# Patient Record
Sex: Female | Born: 1980 | Race: White | Hispanic: No | Marital: Single | Smoking: Current every day smoker
Health system: Southern US, Community
[De-identification: ages and names within clinical notes are randomized; demographics above are authoritative.]

## PROBLEM LIST (undated history)

## (undated) DIAGNOSIS — G43909 Migraine, unspecified, not intractable, without status migrainosus: Secondary | ICD-10-CM

## (undated) DIAGNOSIS — E785 Hyperlipidemia, unspecified: Secondary | ICD-10-CM

## (undated) DIAGNOSIS — M1990 Unspecified osteoarthritis, unspecified site: Secondary | ICD-10-CM

## (undated) DIAGNOSIS — Z803 Family history of malignant neoplasm of breast: Secondary | ICD-10-CM

## (undated) DIAGNOSIS — G4730 Sleep apnea, unspecified: Secondary | ICD-10-CM

## (undated) DIAGNOSIS — Z87442 Personal history of urinary calculi: Secondary | ICD-10-CM

## (undated) DIAGNOSIS — D649 Anemia, unspecified: Secondary | ICD-10-CM

## (undated) DIAGNOSIS — F32A Depression, unspecified: Secondary | ICD-10-CM

## (undated) DIAGNOSIS — I10 Essential (primary) hypertension: Secondary | ICD-10-CM

## (undated) DIAGNOSIS — M419 Scoliosis, unspecified: Secondary | ICD-10-CM

## (undated) DIAGNOSIS — F419 Anxiety disorder, unspecified: Secondary | ICD-10-CM

## (undated) DIAGNOSIS — I499 Cardiac arrhythmia, unspecified: Secondary | ICD-10-CM

## (undated) DIAGNOSIS — J189 Pneumonia, unspecified organism: Secondary | ICD-10-CM

## (undated) HISTORY — PX: CHOLECYSTECTOMY: SHX55

## (undated) HISTORY — DX: Migraine, unspecified, not intractable, without status migrainosus: G43.909

## (undated) HISTORY — DX: Scoliosis, unspecified: M41.9

## (undated) HISTORY — DX: Family history of malignant neoplasm of breast: Z80.3

## (undated) HISTORY — PX: INDUCED ABORTION: SHX677

---

## 2004-08-04 ENCOUNTER — Observation Stay: Payer: Self-pay

## 2004-10-28 ENCOUNTER — Observation Stay: Payer: Self-pay | Admitting: Unknown Physician Specialty

## 2004-11-16 ENCOUNTER — Observation Stay: Payer: Self-pay | Admitting: Unknown Physician Specialty

## 2004-11-24 ENCOUNTER — Observation Stay: Payer: Self-pay

## 2004-11-24 ENCOUNTER — Inpatient Hospital Stay: Payer: Self-pay | Admitting: Unknown Physician Specialty

## 2004-12-16 ENCOUNTER — Emergency Department: Payer: Self-pay | Admitting: Emergency Medicine

## 2005-04-21 ENCOUNTER — Emergency Department: Payer: Self-pay | Admitting: Emergency Medicine

## 2005-04-22 IMAGING — CT CT ABD-PELV W/ CM
1 of 2 series · 16 of 32 positions shown, 20 images · non-contrast
Comparison: none

REASON FOR EXAM: Appendix
COMMENTS:

PROCEDURE:     CT  - CT ABDOMEN / PELVIS  W  - [DATE]  [DATE]
RESULT:     The liver and spleen are normal.  The adrenals are normal.  No
focal renal abnormality is identified.  There is no bowel distention.  The
appendix is normal.  The lung bases are clear.

[Series 2: soft tissue · axial · 0.68mm/px · z∈[-1036,-676]mm · 16 of 135 slices shown, 20 images]
[im 10/135  soft-tissue]
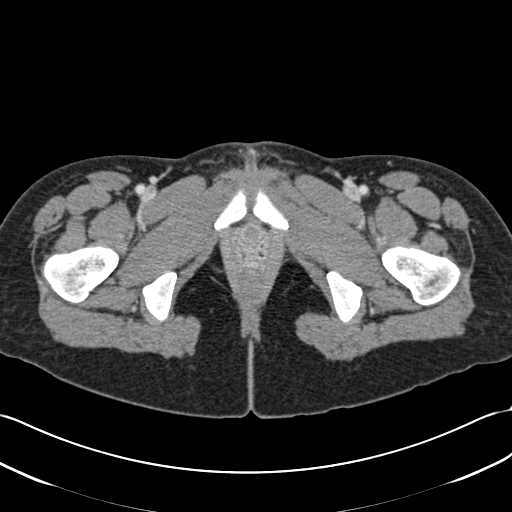
[im 10/135  bone]
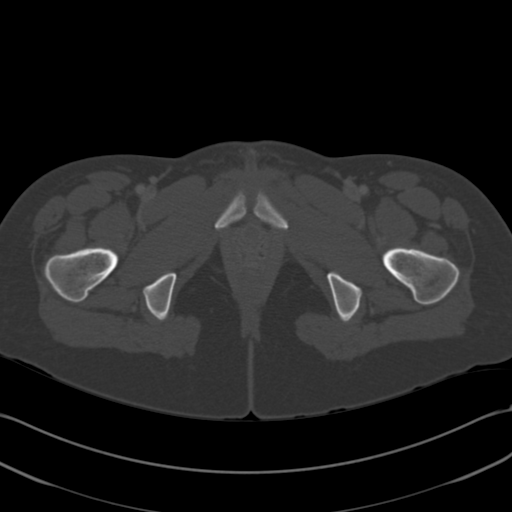
[im 19/135  soft-tissue]
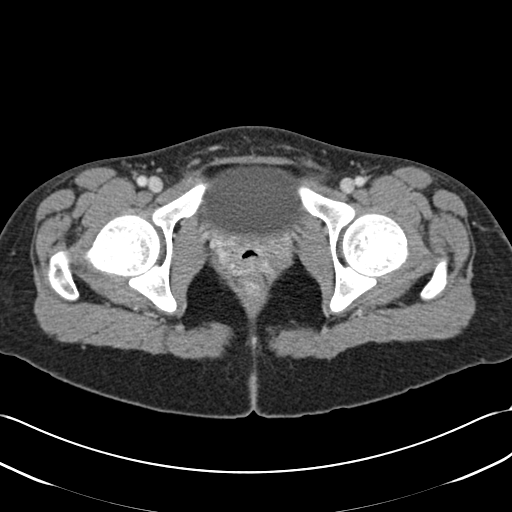
[im 28/135  soft-tissue]
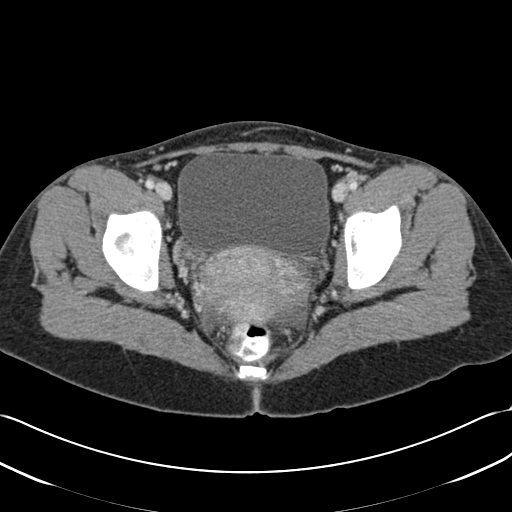
[im 37/135  soft-tissue]
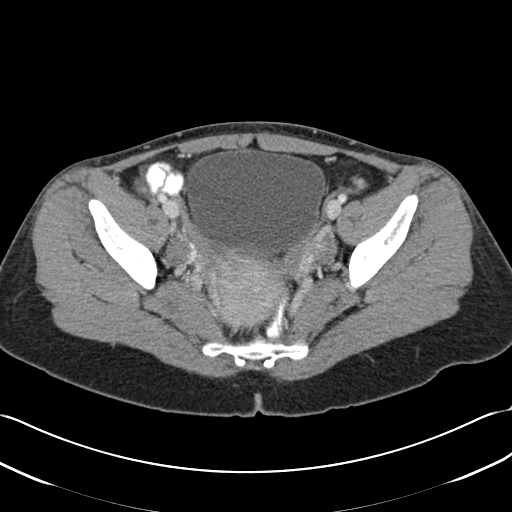
[im 47/135  soft-tissue]
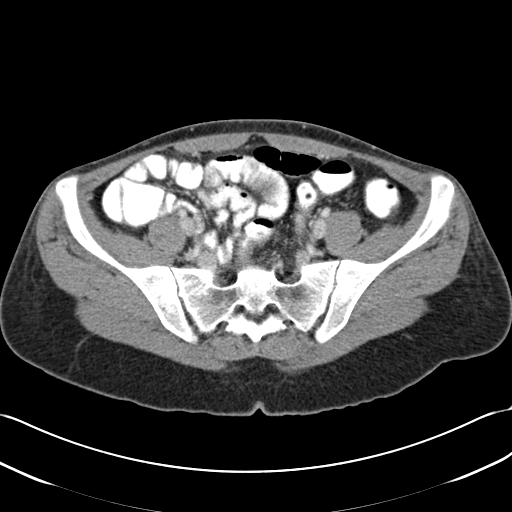
[im 56/135  soft-tissue]
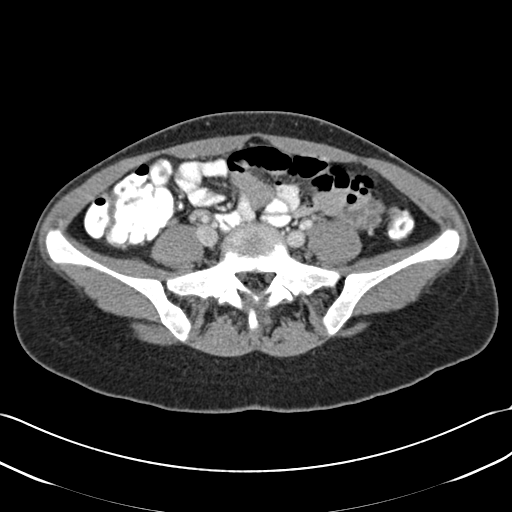
[im 65/135  soft-tissue]
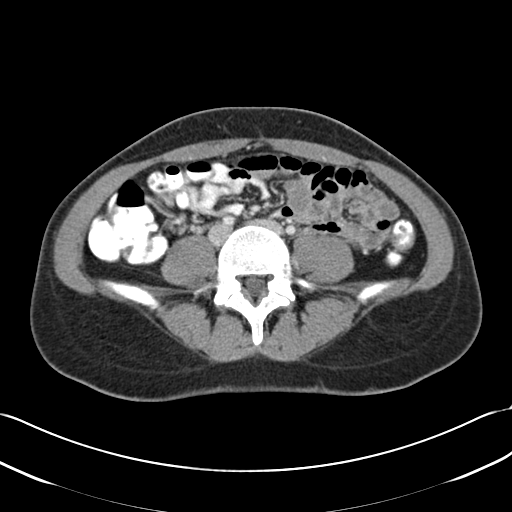
[im 74/135  soft-tissue]
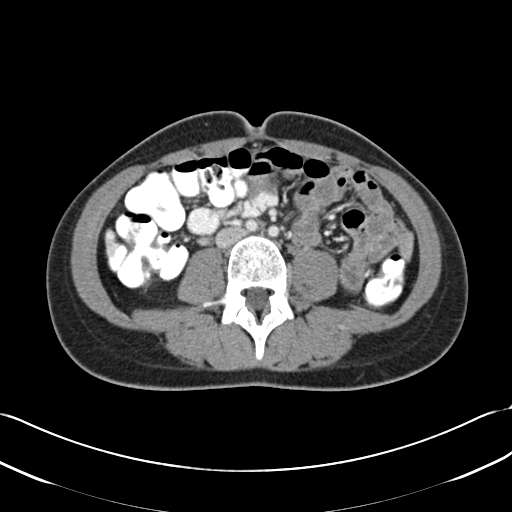
[im 84/135  soft-tissue]
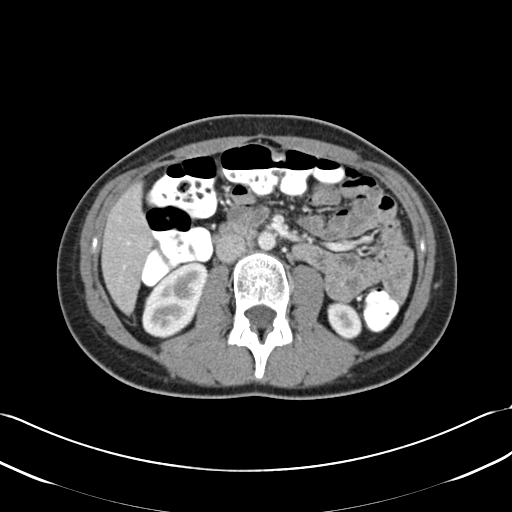
[im 84/135  bone]
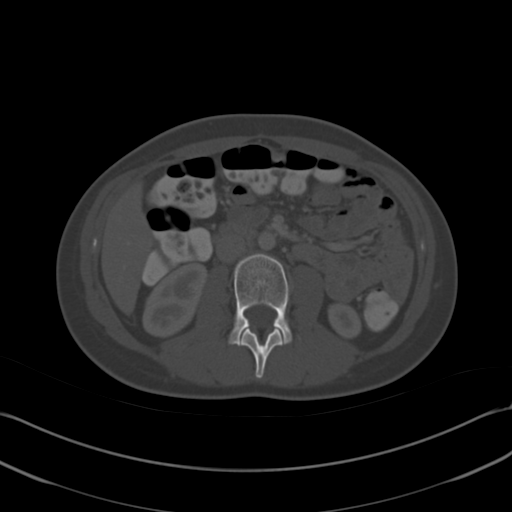
[im 93/135  soft-tissue]
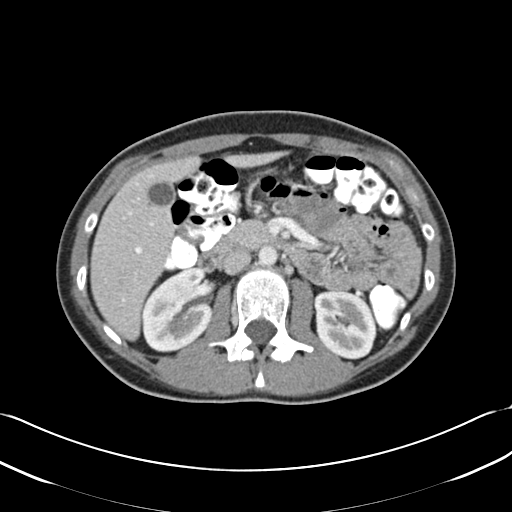
[im 102/135  soft-tissue]
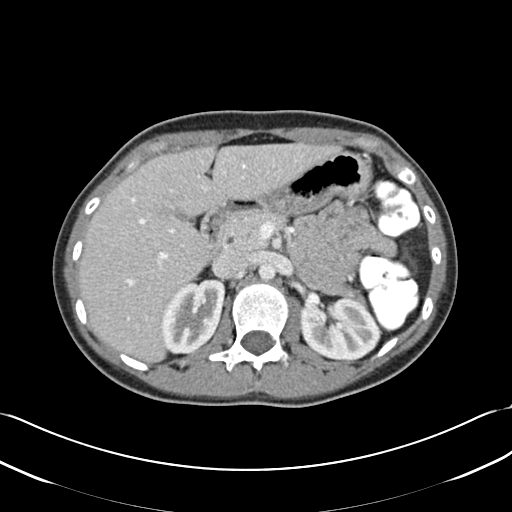
[im 111/135  soft-tissue]
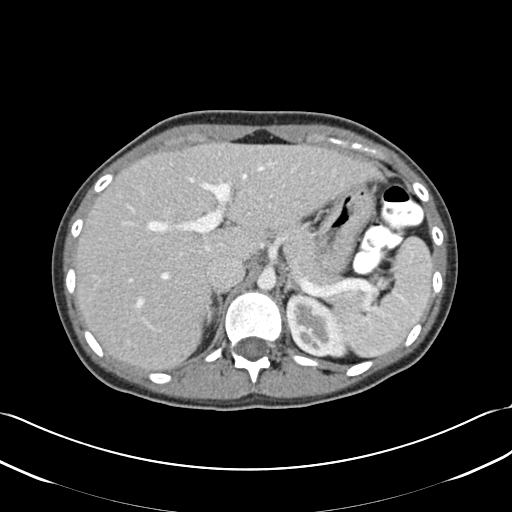
[im 116/135  lung]
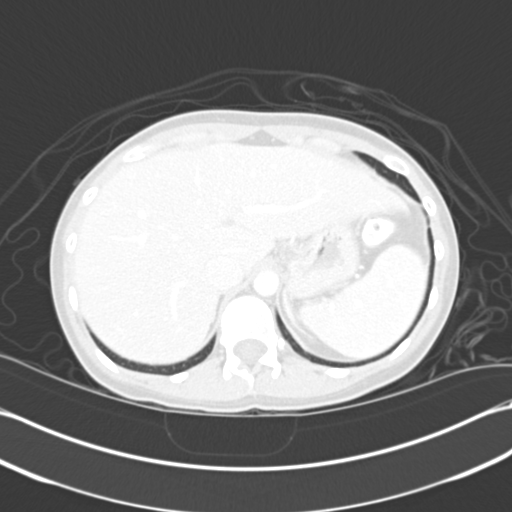
[im 121/135  soft-tissue]
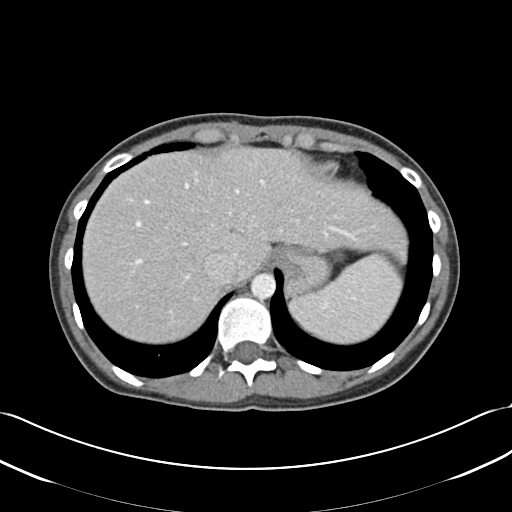
[im 121/135  lung]
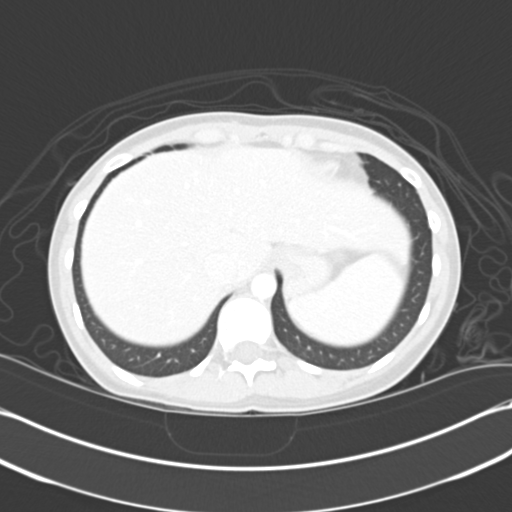
[im 125/135  lung]
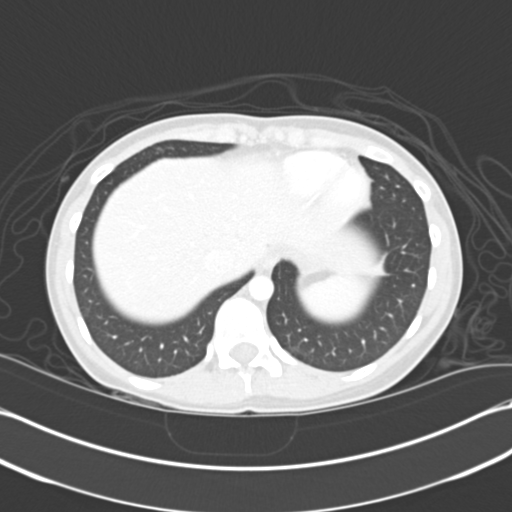
[im 130/135  soft-tissue]
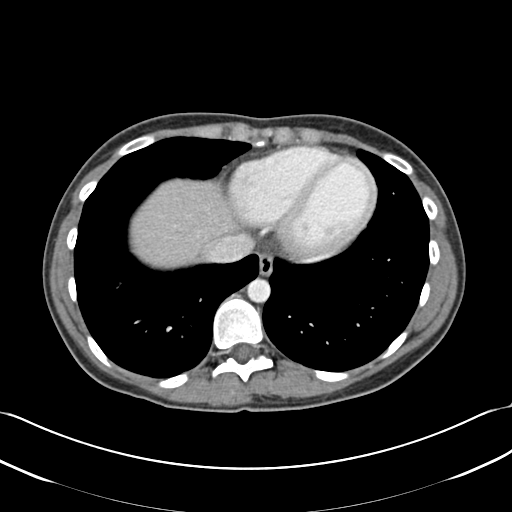
[im 130/135  lung]
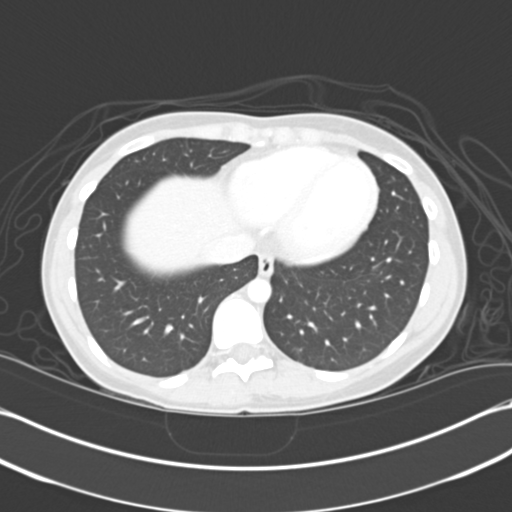

[16 of 32 positions shown; findings below may reference images not displayed]

IMPRESSION: Nonspecific abdomen and pelvis. The initial report was
faxed to the emergency room at the time of the study.  Specifically, there
is no evidence of appendicitis.

## 2005-04-23 ENCOUNTER — Ambulatory Visit: Payer: Self-pay | Admitting: Emergency Medicine

## 2005-04-23 IMAGING — US US PELV - US TRANSVAGINAL
1 series · 14 of 14 positions shown · non-contrast
Comparison: none

REASON FOR EXAM: RLQ pain
COMMENTS:

[Series 1: us pelv - us transvaginal · 0.31mm/px · 14 of 14 slices shown]
[im 1/14]
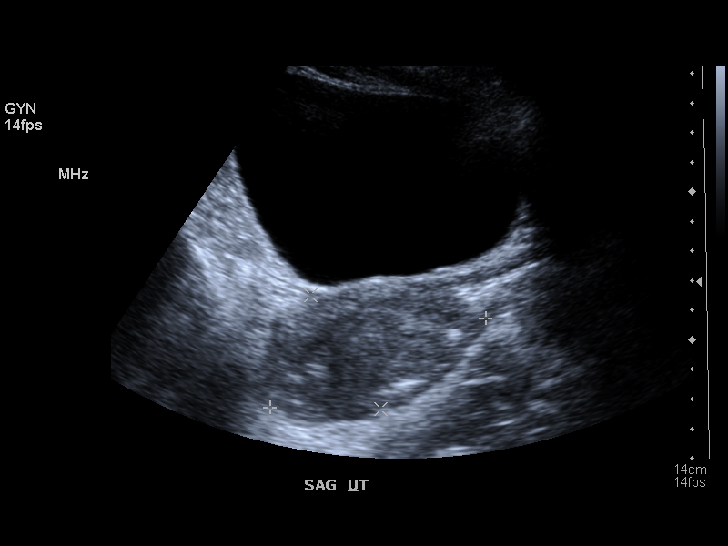
[im 2/14]
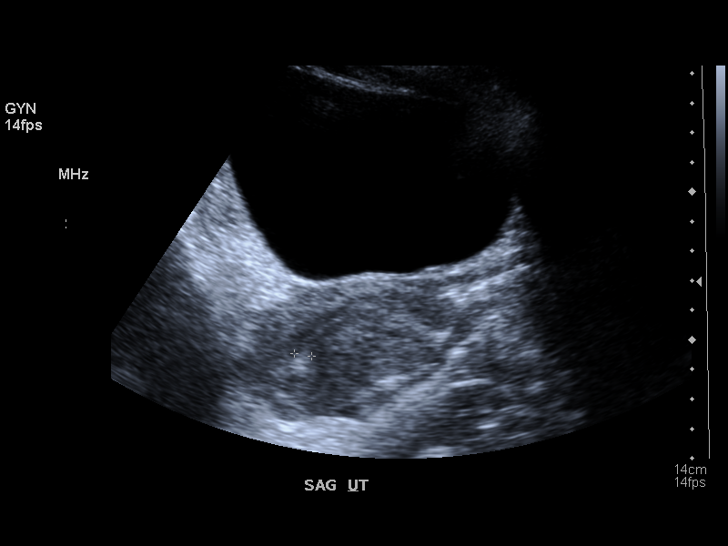
[im 3/14]
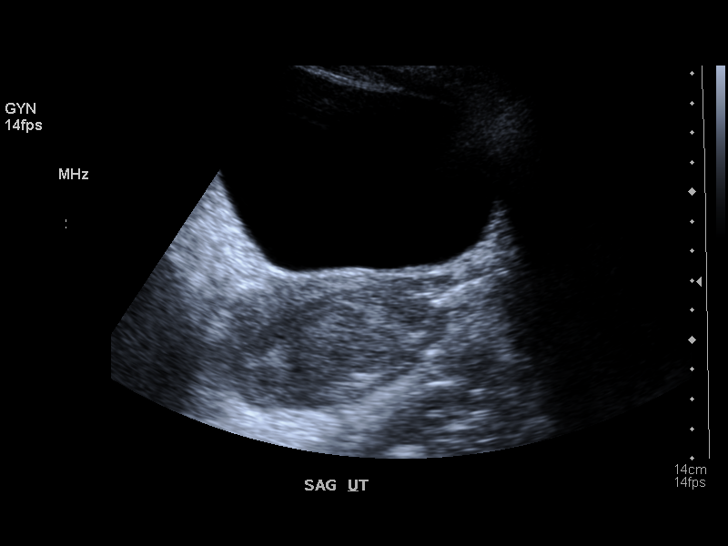
[im 4/14]
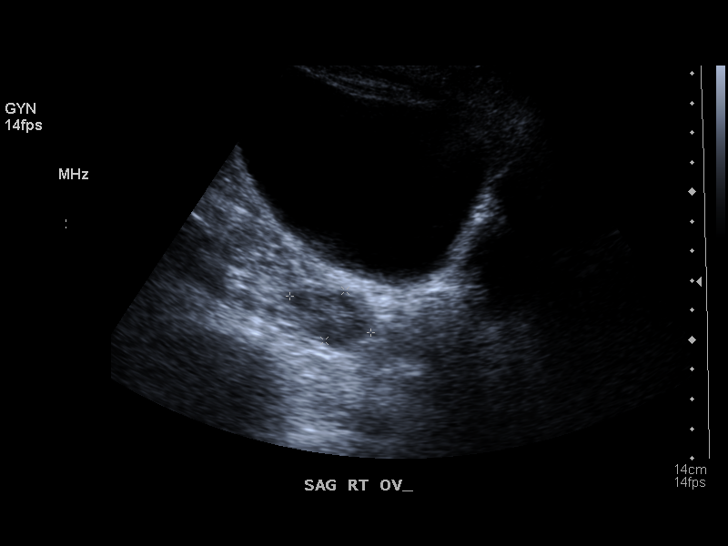
[im 5/14]
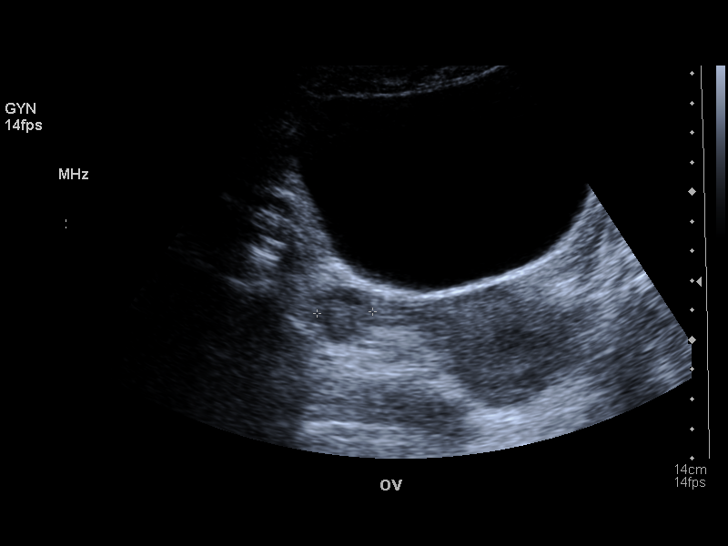
[im 6/14]
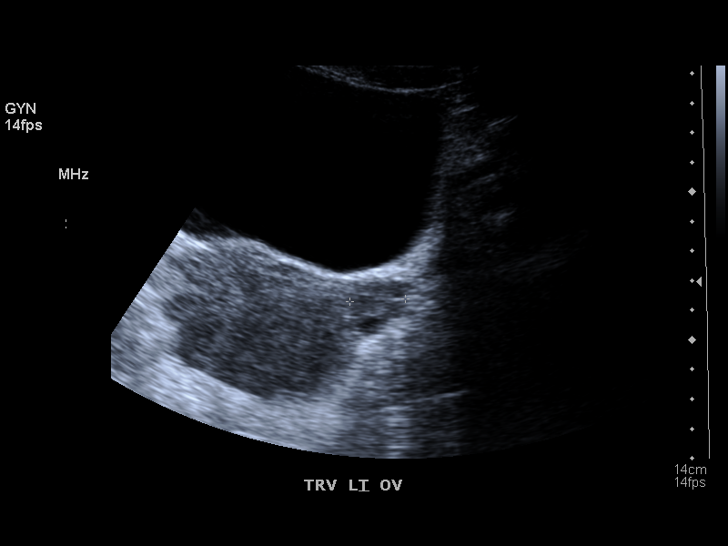
[im 7/14]
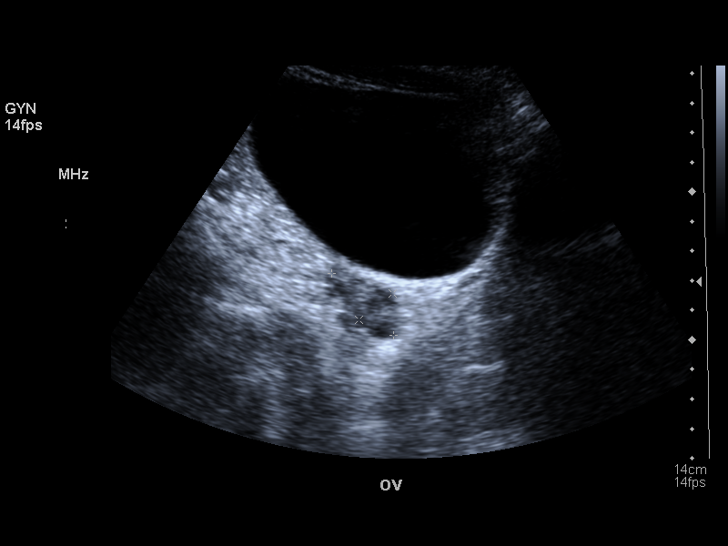
[im 8/14]
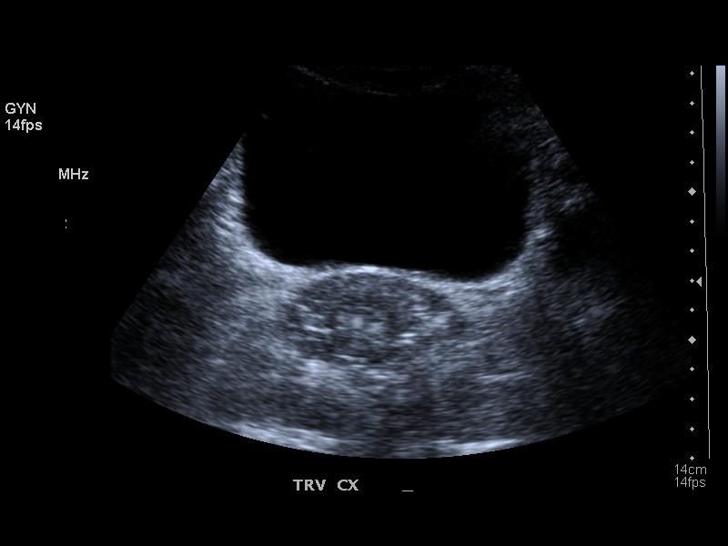
[im 9/14]
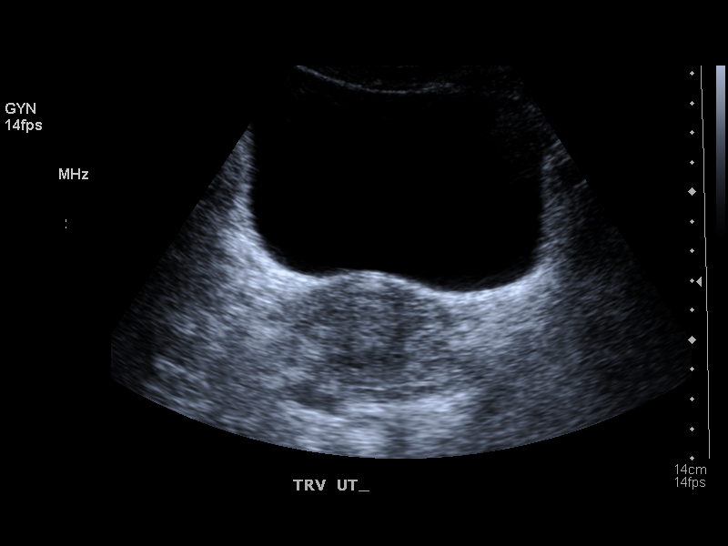
[im 10/14]
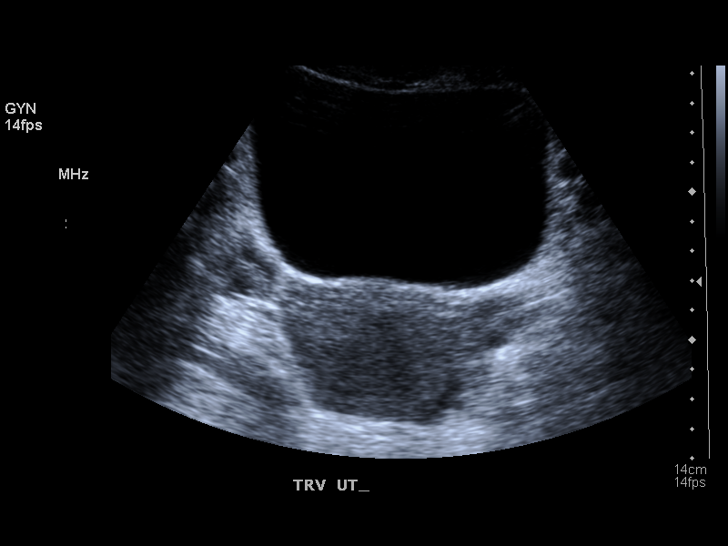
[im 11/14]
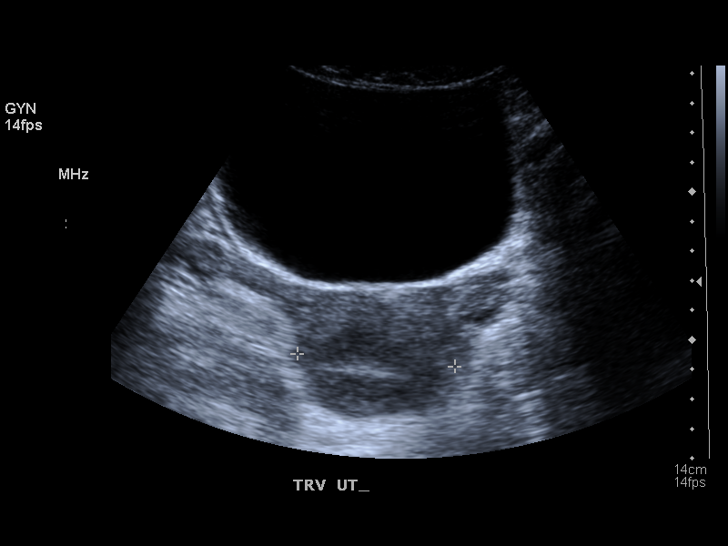
[im 12/14]
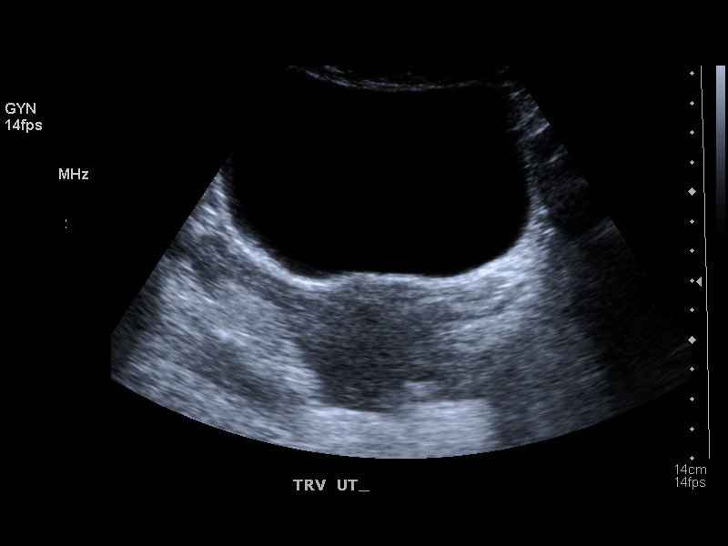
[im 13/14]
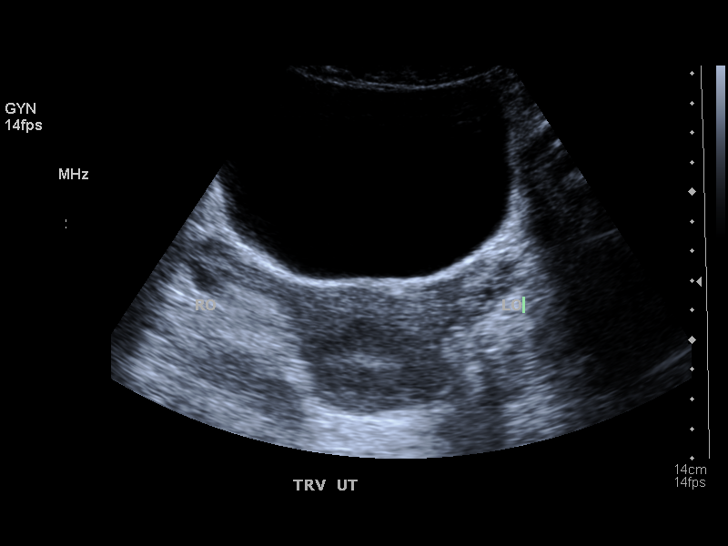
[im 14/14]
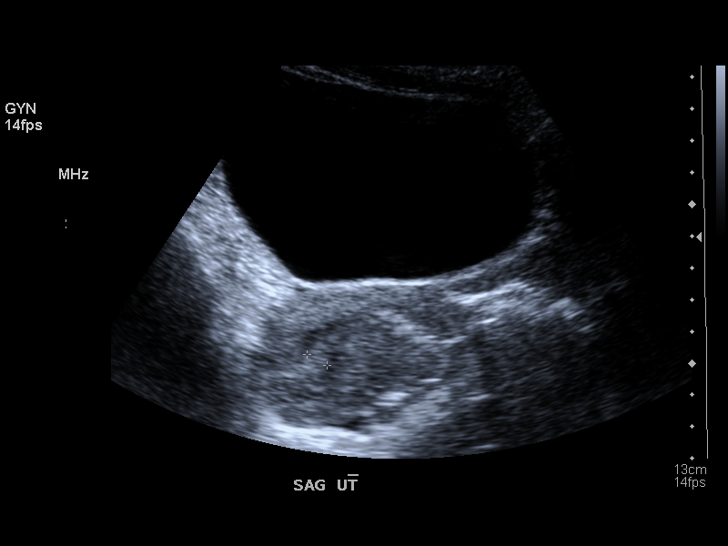

[14 of 14 positions shown; findings below may reference images not displayed]

PROCEDURE:     US  - US PELVIS MASS EXAM  - [DATE] [DATE] [DATE] [DATE]

RESULT:     The uterus measures 7.9 cm x 5.3 cm x 4.5 cm.  No intrauterine
products of conception are seen.  The endometrium measures 7.2 mm in
thickness.  RIGHT and LEFT ovaries are visualized.  Each ovary measures
approximately 3 cm at maximum diameter.  No abnormal adnexal masses are
seen.  There is no free fluid in the pelvis.  The visualized portion of the
urinary bladder is normal in appearance.
IMPRESSION: No significant abnormalities are noted.

## 2005-10-04 ENCOUNTER — Observation Stay: Payer: Self-pay | Admitting: Unknown Physician Specialty

## 2005-10-05 ENCOUNTER — Ambulatory Visit: Payer: Self-pay | Admitting: Obstetrics & Gynecology

## 2005-11-21 ENCOUNTER — Observation Stay: Payer: Self-pay

## 2006-01-07 ENCOUNTER — Inpatient Hospital Stay: Payer: Self-pay

## 2006-05-05 ENCOUNTER — Emergency Department: Payer: Self-pay | Admitting: Internal Medicine

## 2006-11-17 ENCOUNTER — Emergency Department: Payer: Self-pay | Admitting: Emergency Medicine

## 2007-01-28 ENCOUNTER — Emergency Department: Payer: Self-pay | Admitting: Emergency Medicine

## 2007-01-28 IMAGING — CR DG CHEST 2V
1 series · 2 of 2 positions shown · non-contrast
Comparison: none

REASON FOR EXAM: Fever, sore throat
COMMENTS:

PROCEDURE:     DXR - DXR CHEST PA (OR AP) AND LATERAL  - [DATE]  [DATE]
RESULT:     The lungs are well expanded. There is no focal infiltrate. The
heart is not enlarged and the pulmonary vascularity is not engorged. There
is curvature of the thoracic spine, convex toward the RIGHT.

[Series 1: view not recorded · 0.17mm/px · 2 of 2 slices shown]
[im 1/2]
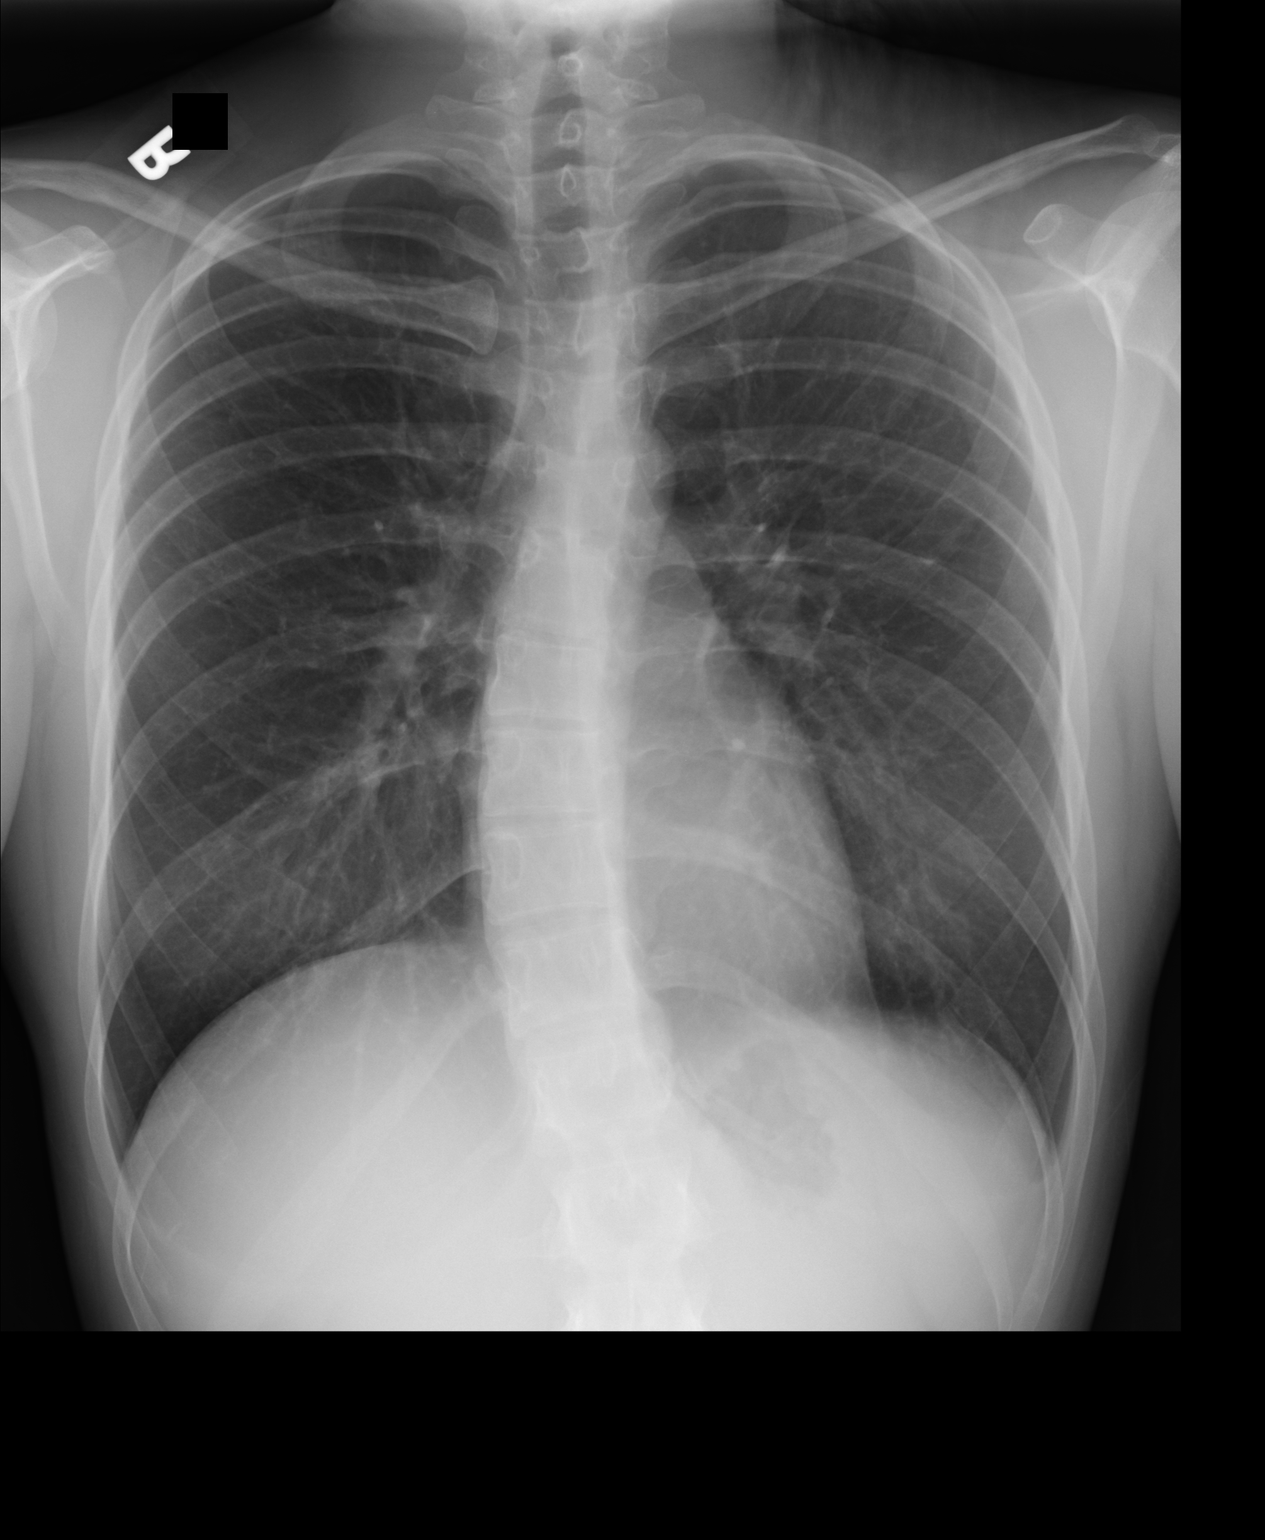
[im 2/2]
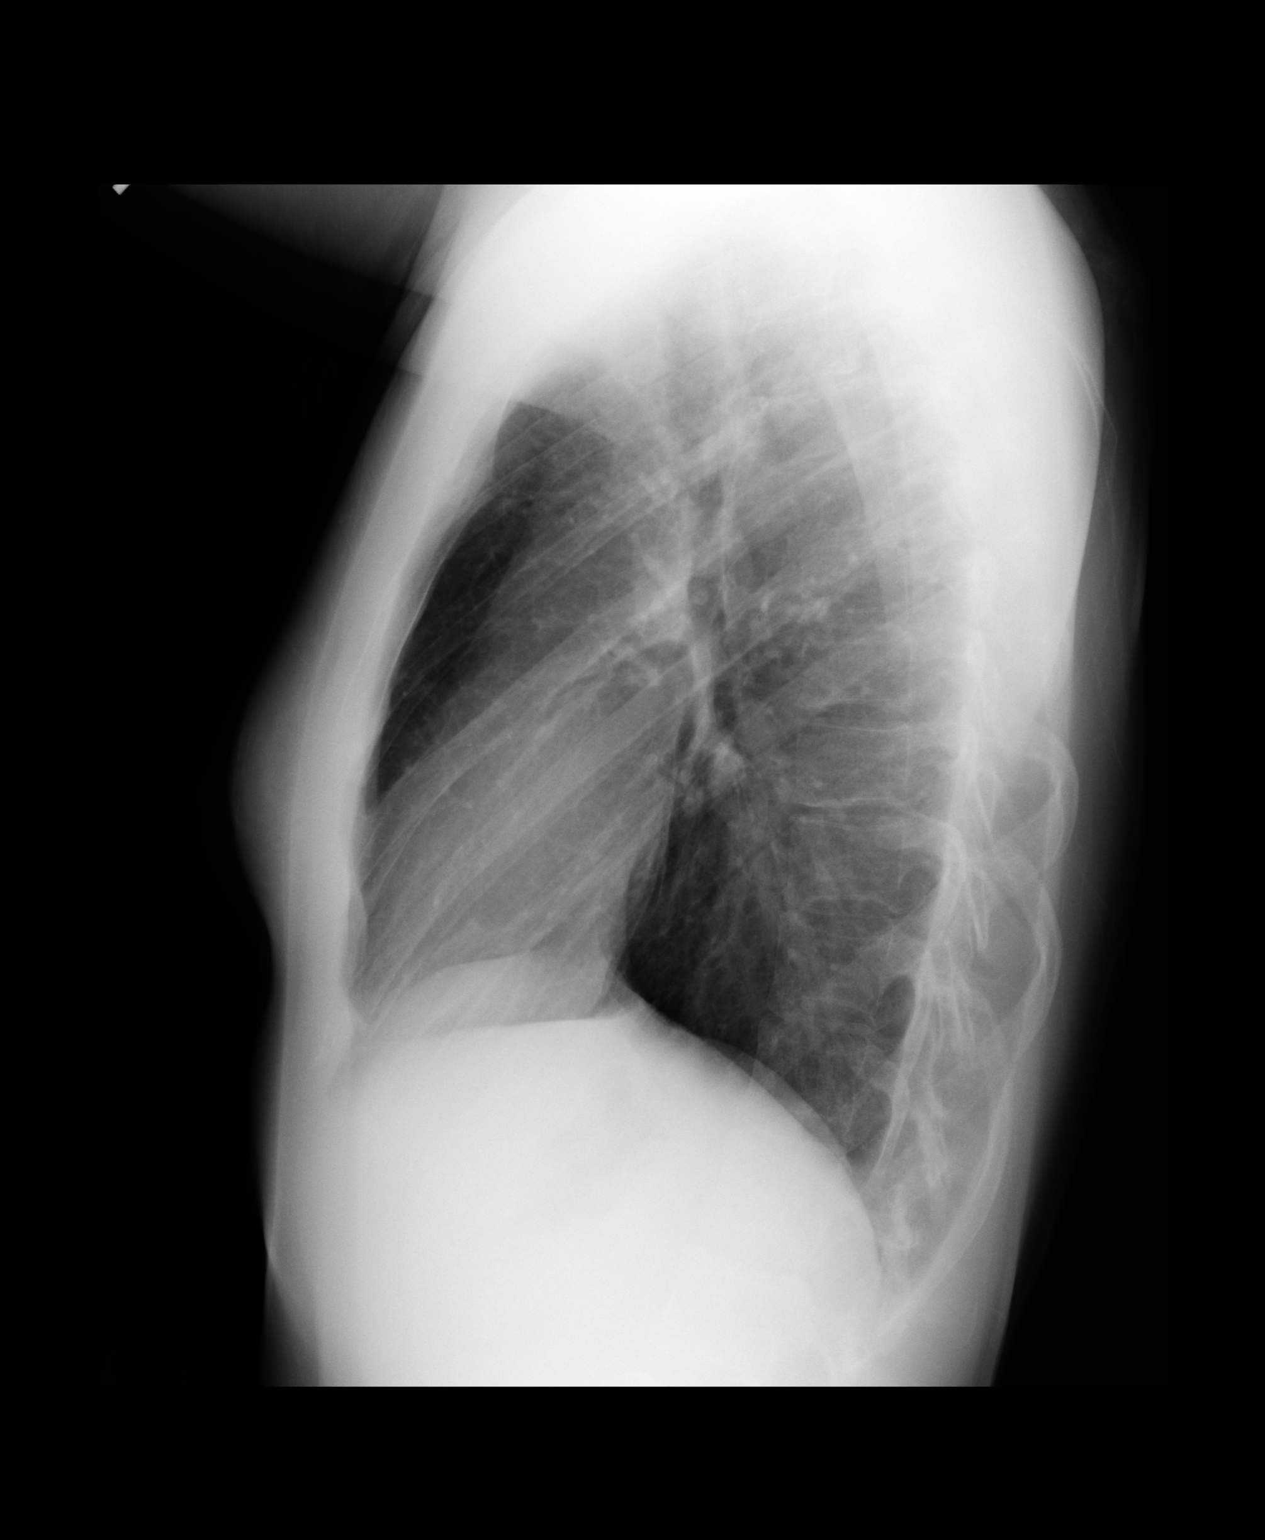

[2 of 2 positions shown; findings below may reference images not displayed]

IMPRESSION: I do not see evidence of acute cardiopulmonary abnormality.

## 2007-07-19 ENCOUNTER — Emergency Department: Payer: Self-pay | Admitting: Emergency Medicine

## 2007-07-19 IMAGING — CT CT STONE STUDY
1 of 2 series · 15 of 32 positions shown, 19 images · non-contrast
Comparison: none

REASON FOR EXAM: abdominal pain       rm 12
COMMENTS:

PROCEDURE:     CT  - CT ABDOMEN /PELVIS WO (STONE)  - [DATE] [DATE]
RESULT:
HISTORY: Abdominal pain.

[Series 2: stone · axial · 0.61mm/px · z∈[-948,-598]mm · 15 of 131 slices shown, 19 images]
[im 9/131  soft-tissue]
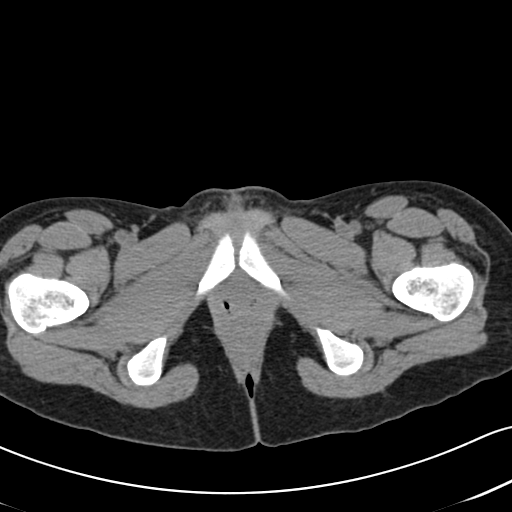
[im 9/131  bone]
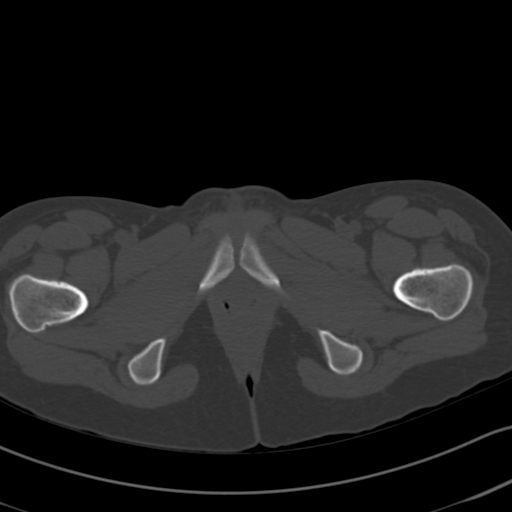
[im 18/131  soft-tissue]
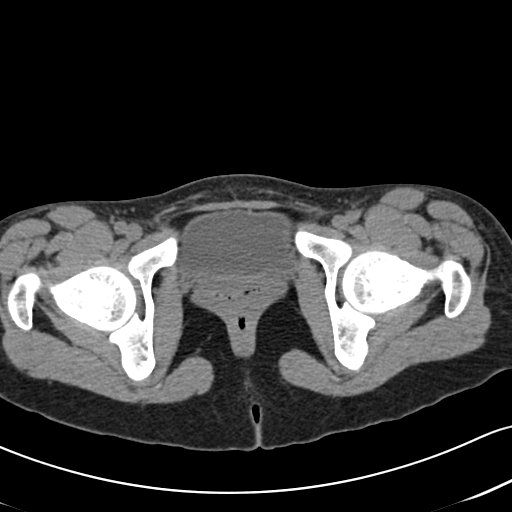
[im 27/131  soft-tissue]
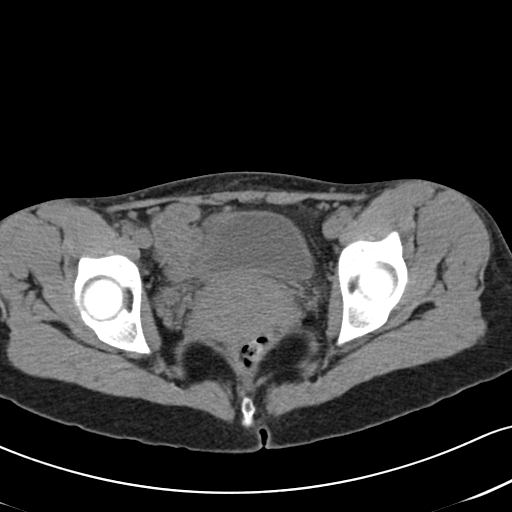
[im 36/131  soft-tissue]
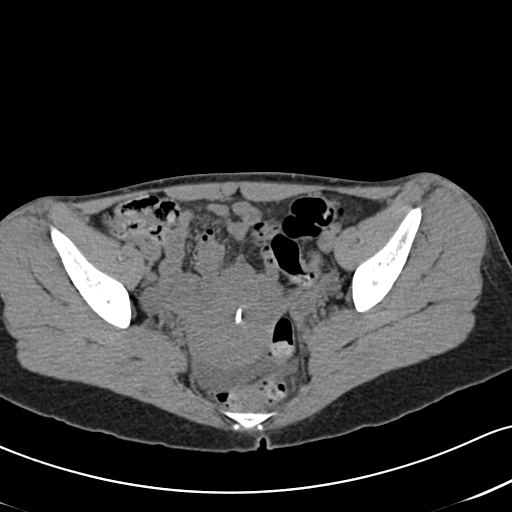
[im 45/131  soft-tissue]
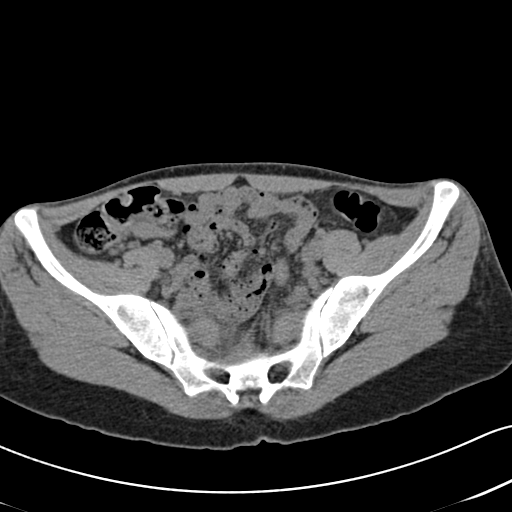
[im 54/131  soft-tissue]
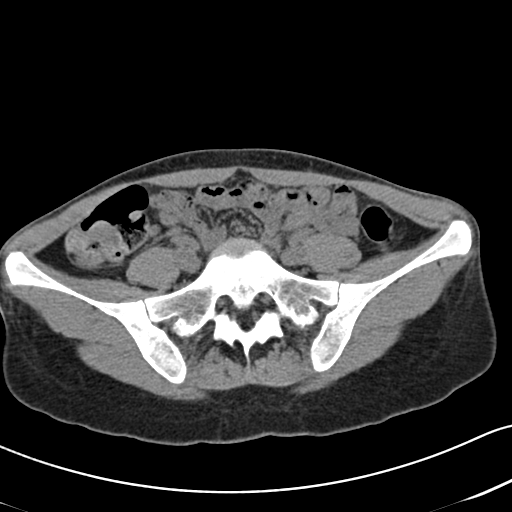
[im 68/131  soft-tissue]
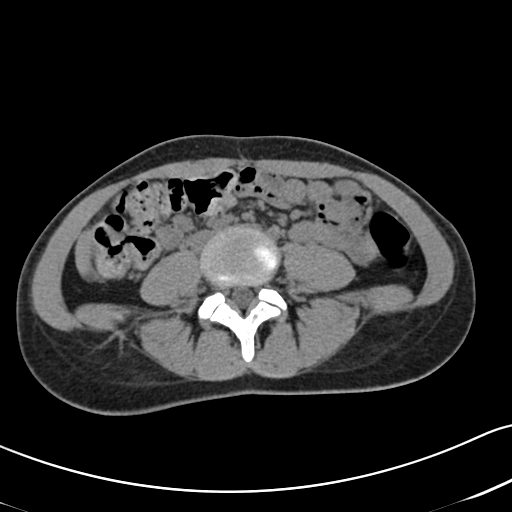
[im 77/131  soft-tissue]
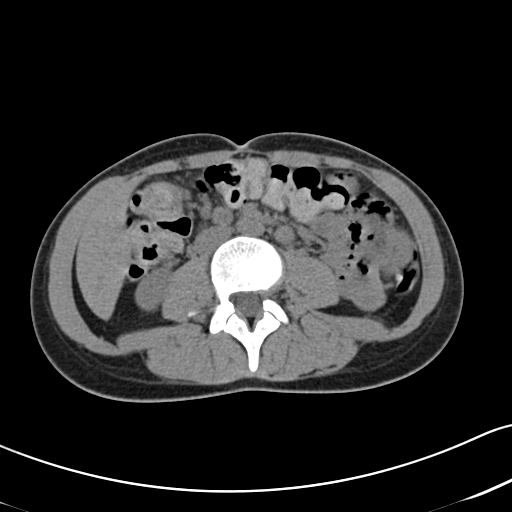
[im 86/131  soft-tissue]
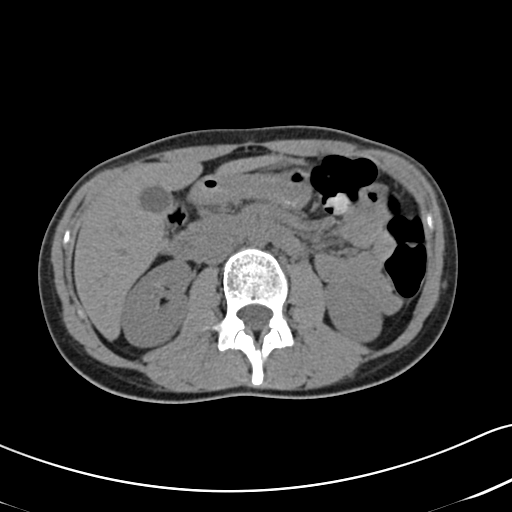
[im 86/131  bone]
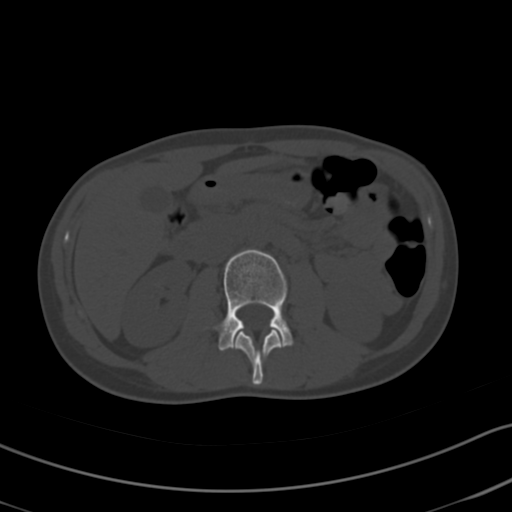
[im 95/131  soft-tissue]
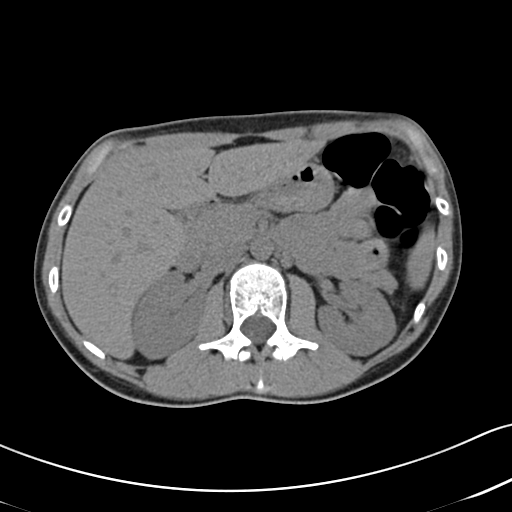
[im 104/131  soft-tissue]
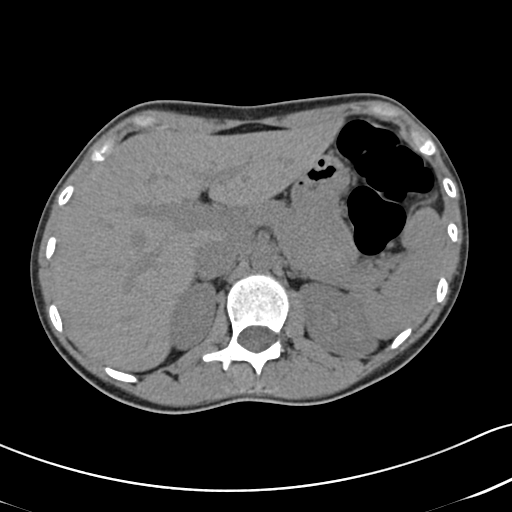
[im 113/131  soft-tissue]
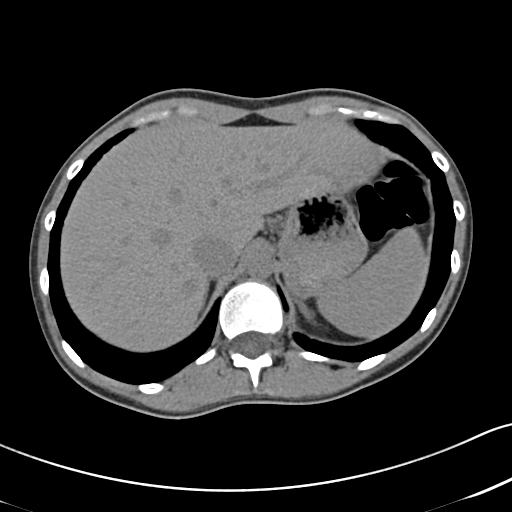
[im 113/131  lung]
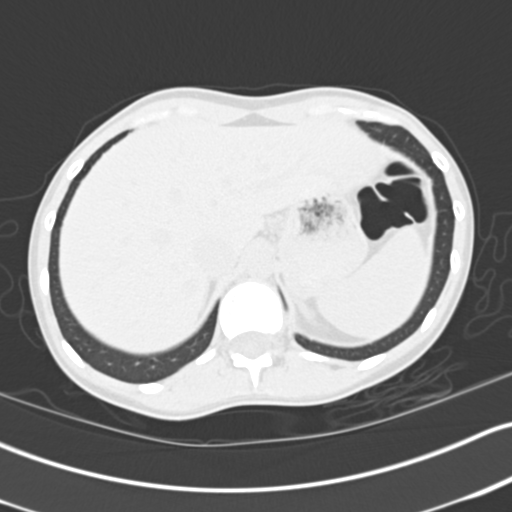
[im 117/131  lung]
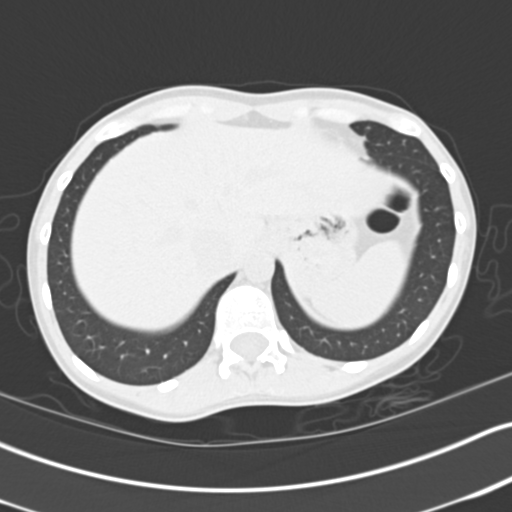
[im 122/131  soft-tissue]
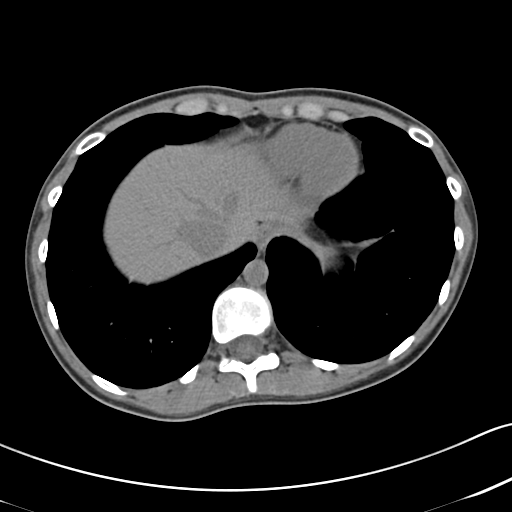
[im 122/131  lung]
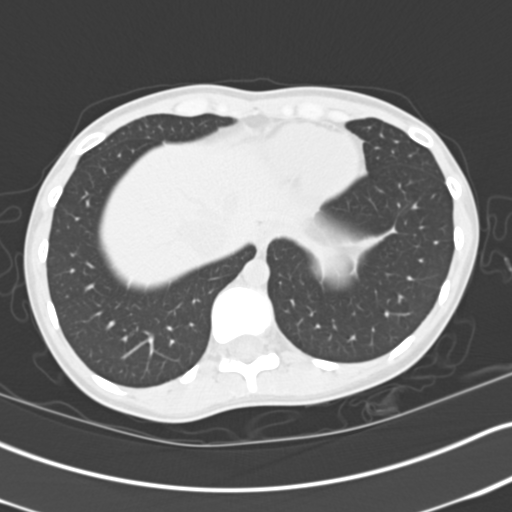
[im 126/131  lung]
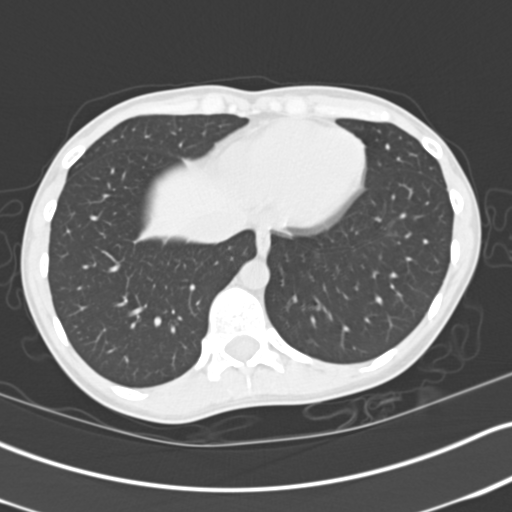

[15 of 32 positions shown; findings below may reference images not displayed]

COMPARISON STUDIES: No recent.

PROCEDURE AND FINDINGS: The liver and spleen are unremarkable.  The adrenals
are normal. The pancreas is normal.  The kidneys are unremarkable.  There is
no bowel distention.  An intrauterine device is present. Free intrapelvic
fluid is noted. No inguinal adenopathy is noted. The lung bases are clear.
No free air is noted.  The RIGHT lower quadrant is unremarkable.  The
appendix is not well visualized on this non-oral contrast enhanced exam.
IMPRESSION: 1)Free intrapelvic fluid noted. Clinical correlation is suggested.

2)Calcified pelvic phlebolith. No ureteral stone noted.  If the patient's
symptoms persist and further evaluation is needed, IV and oral contrast
enhanced CT of the abdomen and pelvis can be obtained.

This report was phoned to the patient's physician at the time of the study.

## 2007-12-13 ENCOUNTER — Emergency Department: Payer: Self-pay | Admitting: Emergency Medicine

## 2008-10-11 ENCOUNTER — Emergency Department: Payer: Self-pay | Admitting: Emergency Medicine

## 2008-10-11 IMAGING — CR DG CHEST 2V
1 series · 2 of 2 positions shown · non-contrast
Comparison: none

REASON FOR EXAM: chest discomfort
COMMENTS:

[Series 1: view not recorded · 0.17mm/px · 2 of 2 slices shown]
[im 1/2]
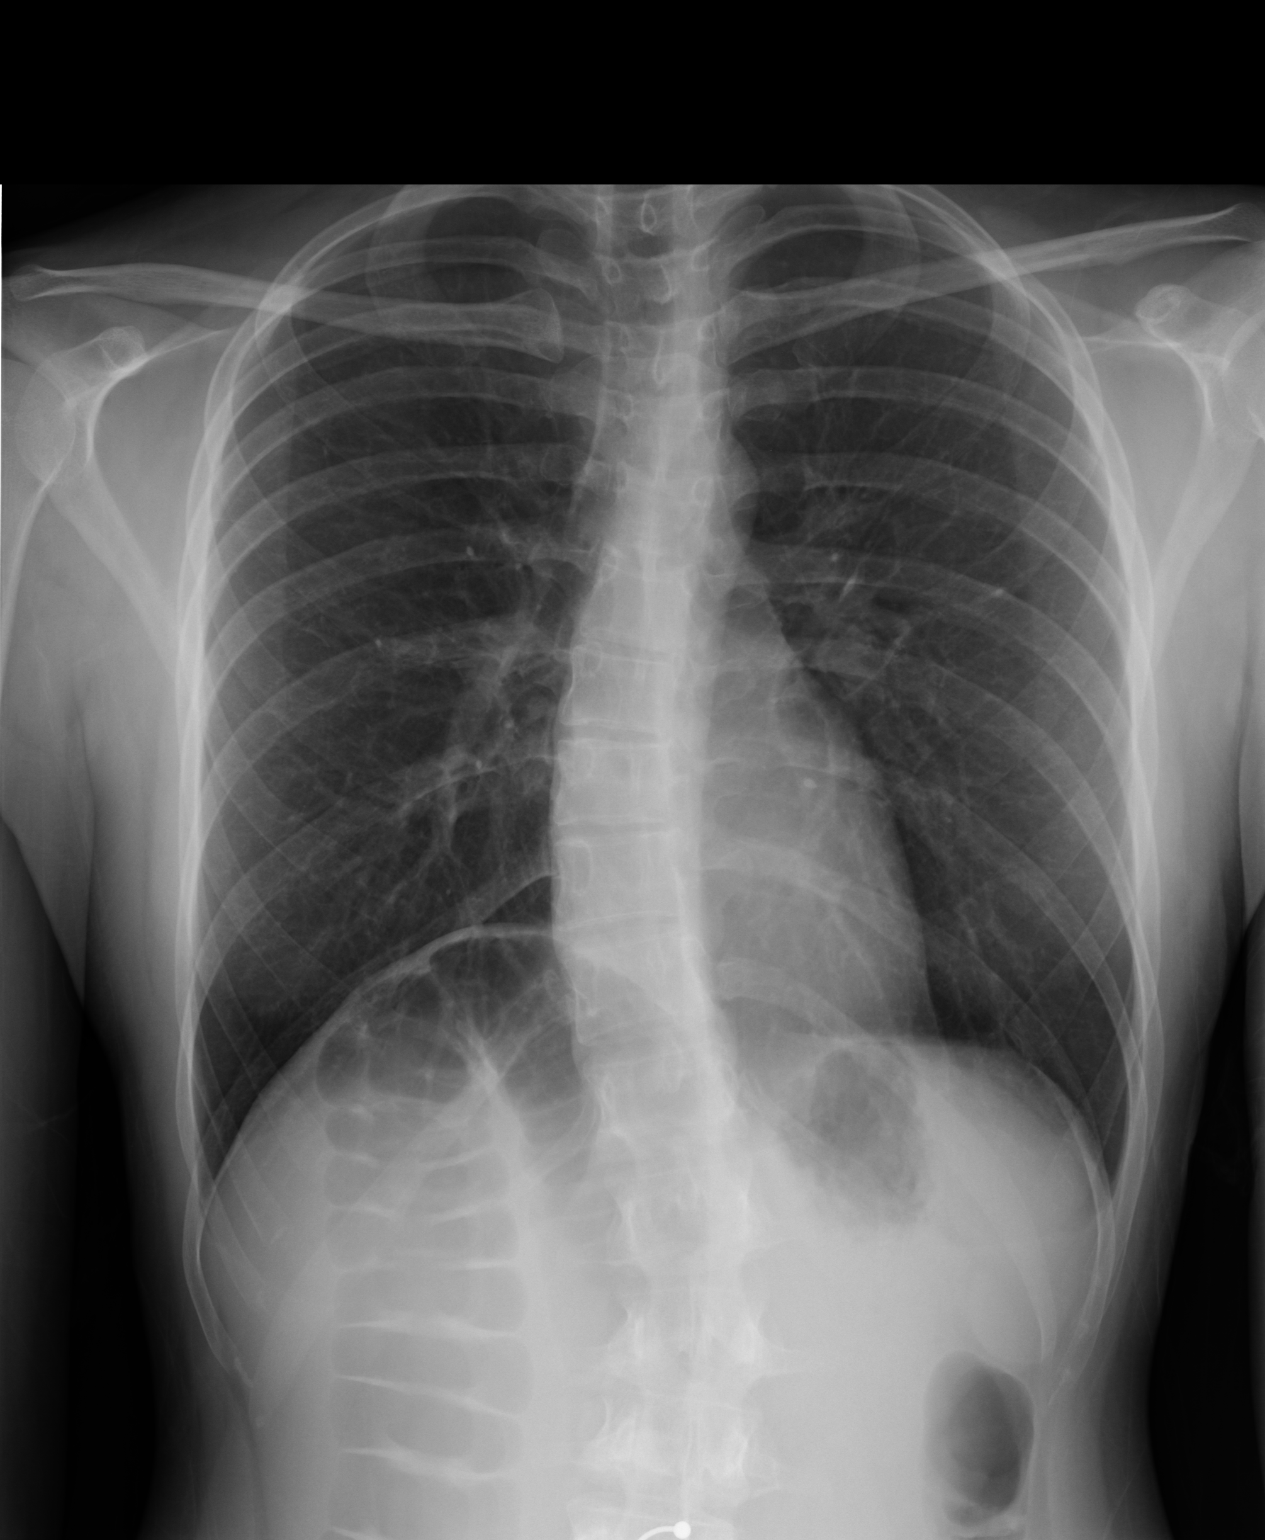
[im 2/2]
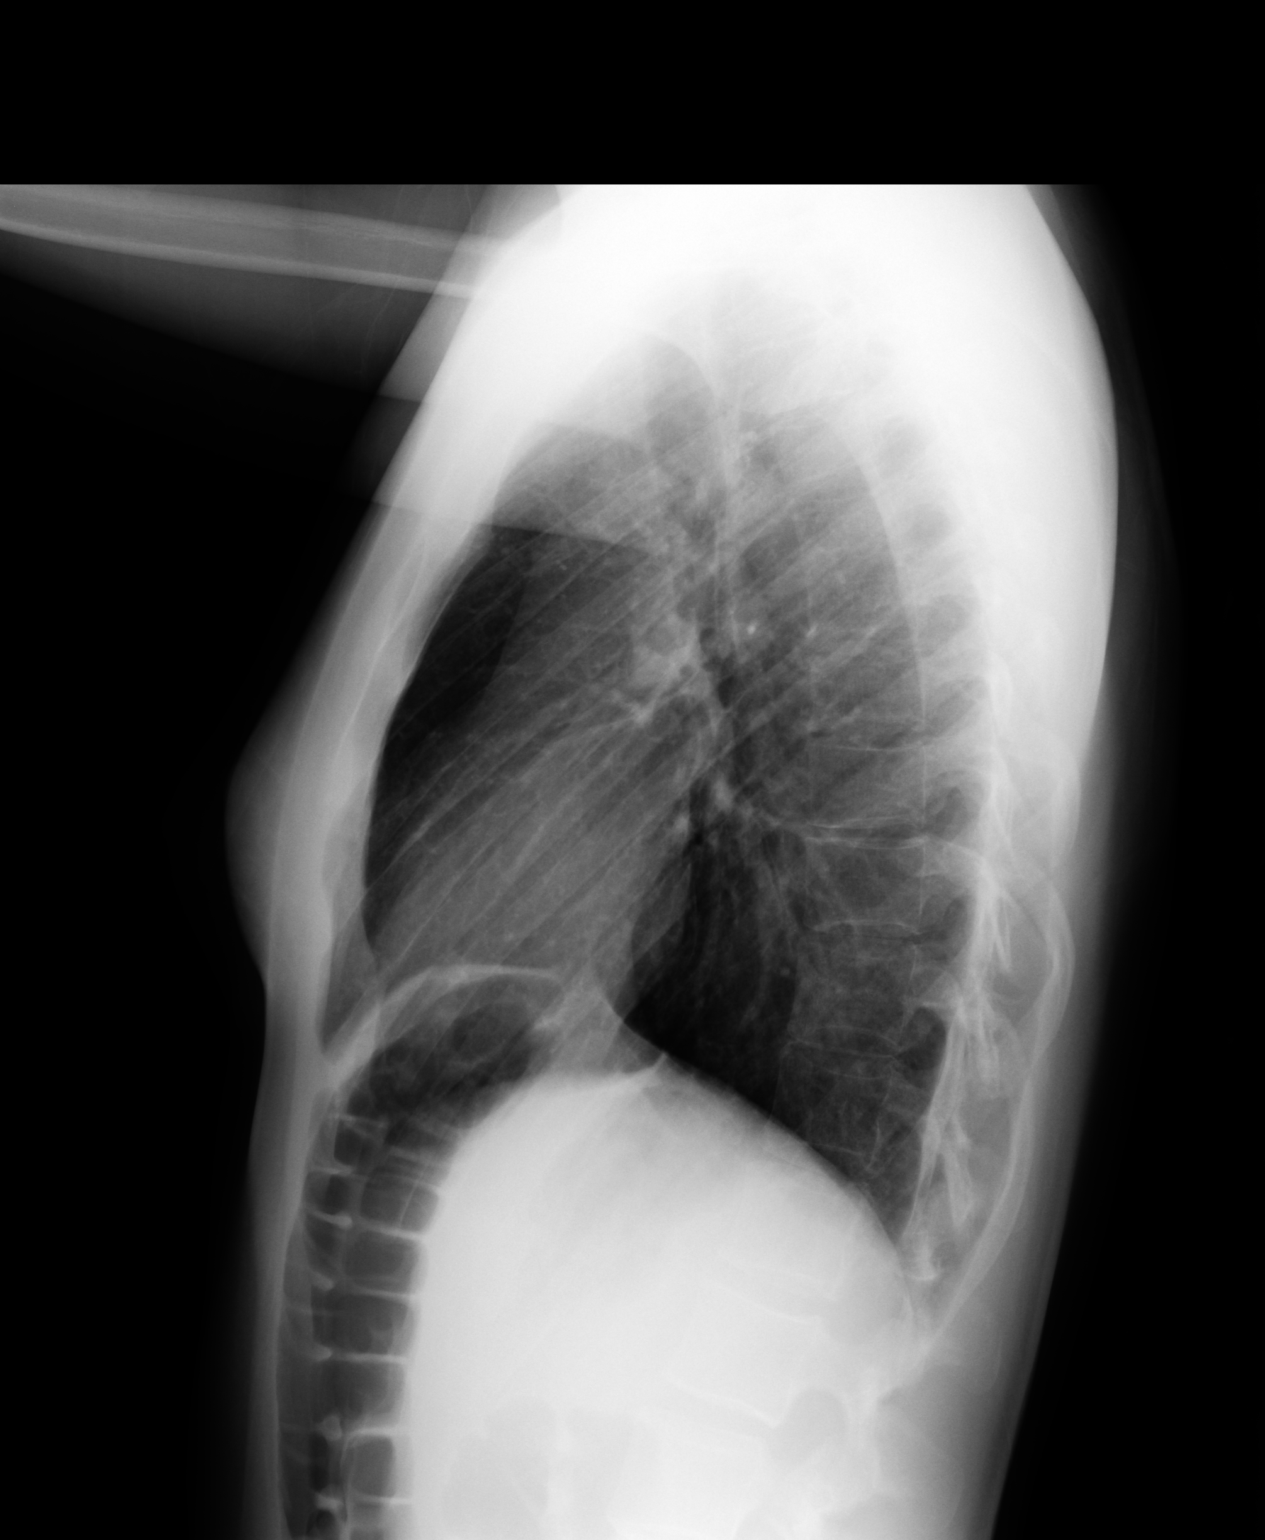

[2 of 2 positions shown; findings below may reference images not displayed]

PROCEDURE:     DXR - DXR CHEST PA (OR AP) AND LATERAL  - [DATE]  [DATE]

RESULT:     Comparison is made to a prior exam of [DATE]. The lung fields
are clear. The heart, mediastinal and osseous structures show no significant
abnormalities. A mild to moderate thoracolumbar scoliosis is again observed.
IMPRESSION: 1. No acute changes are identified.
2. A thoracolumbar scoliosis is again noted.

## 2008-10-11 IMAGING — US ABDOMEN ULTRASOUND
1 series · 17 of 25 positions shown · non-contrast
Comparison: No comparison

REASON FOR EXAM: R abd pain, R chest/shoulder pain - eval for biliary
disease
COMMENTS:

PROCEDURE:     US  - US ABDOMEN GENERAL SURVEY  - [DATE] [DATE]
RESULT:     Indication: Right abdominal pain
TECHNIQUE: Multiple gray-scale and color-flow Doppler images of the abdomen
are presented for review.

[Series 1: abdomen ultrasound · 17 of 46 slices shown]
[im 1/46]
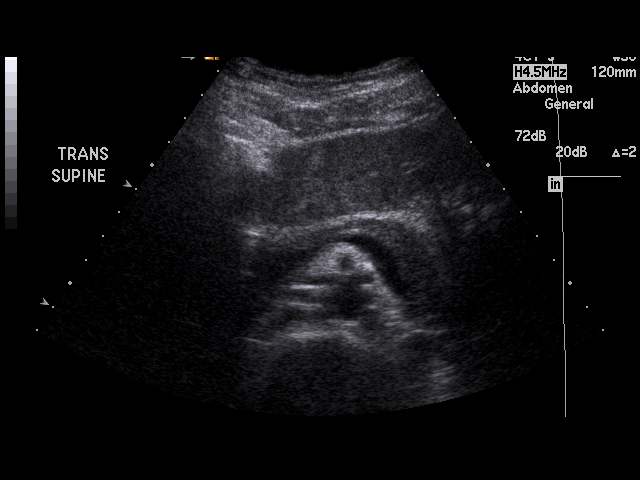
[im 4/46]
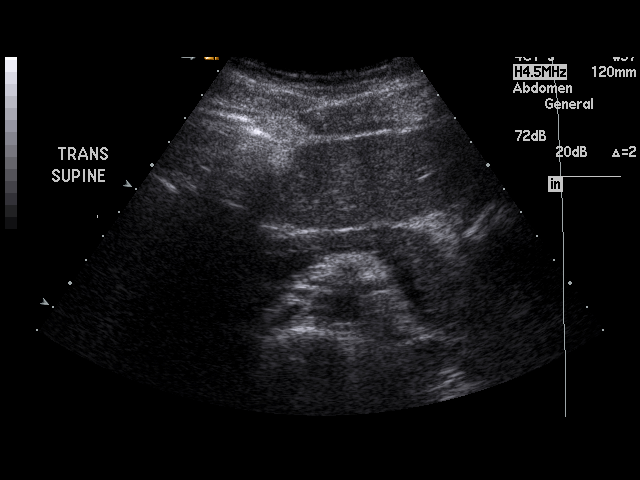
[im 6/46]
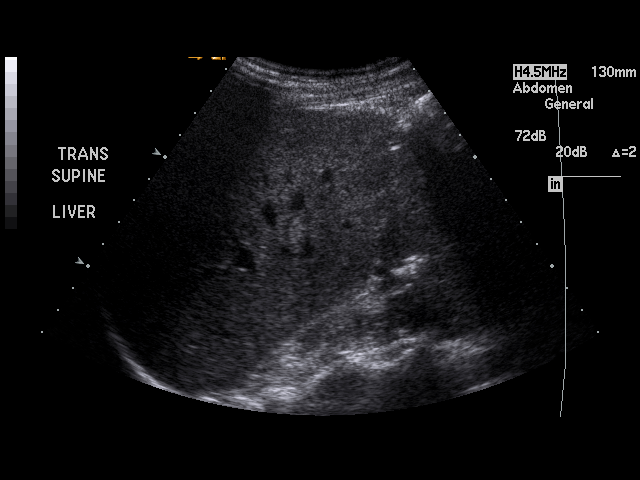
[im 10/46]
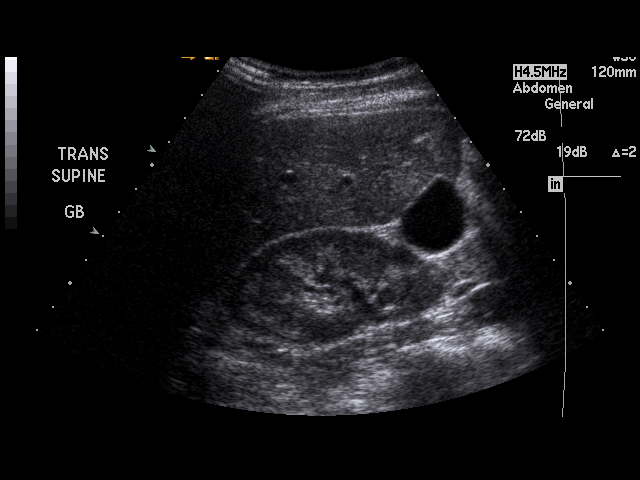
[im 12/46]
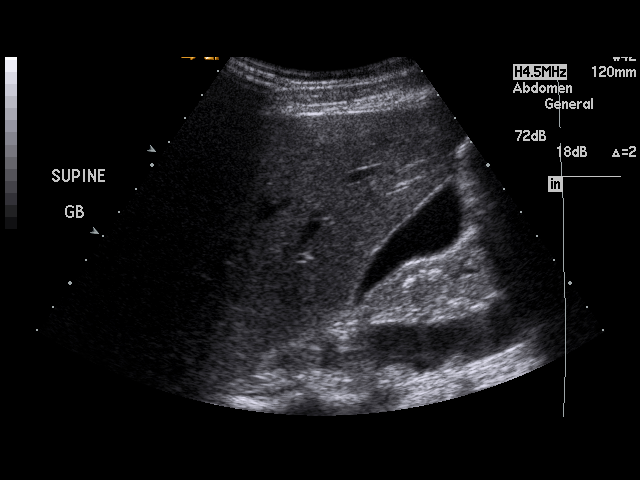
[im 16/46]
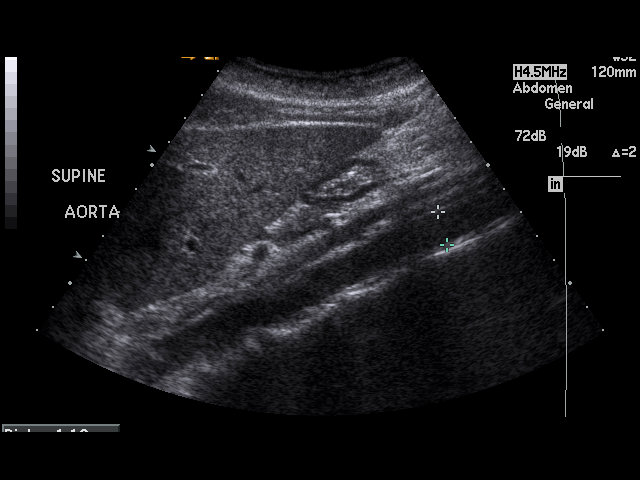
[im 17/46]
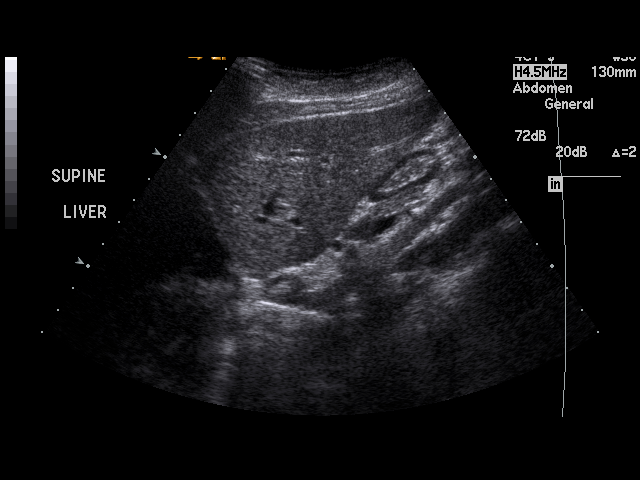
[im 21/46]
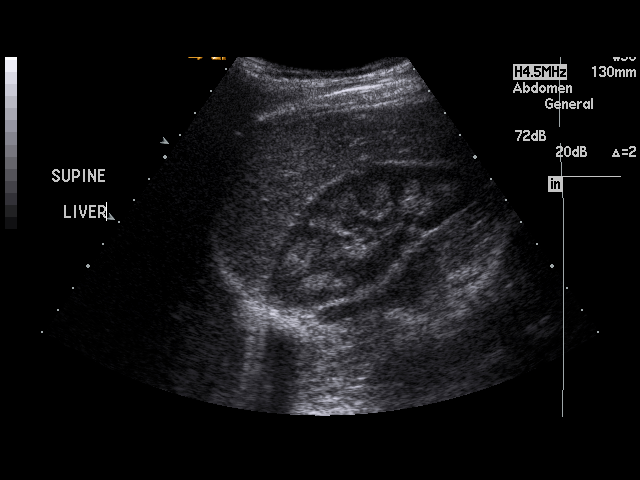
[im 23/46]
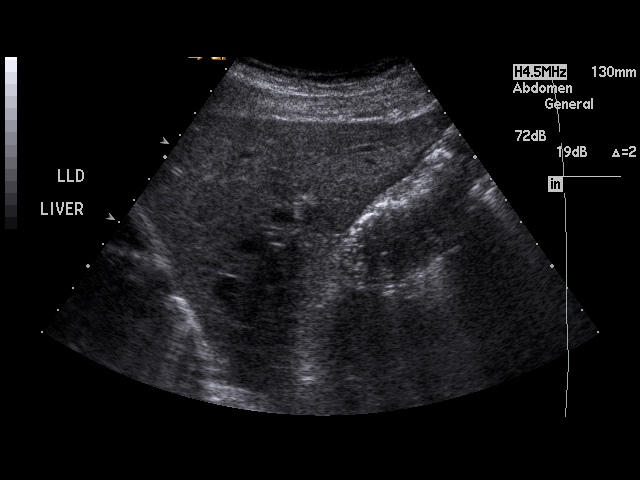
[im 25/46]
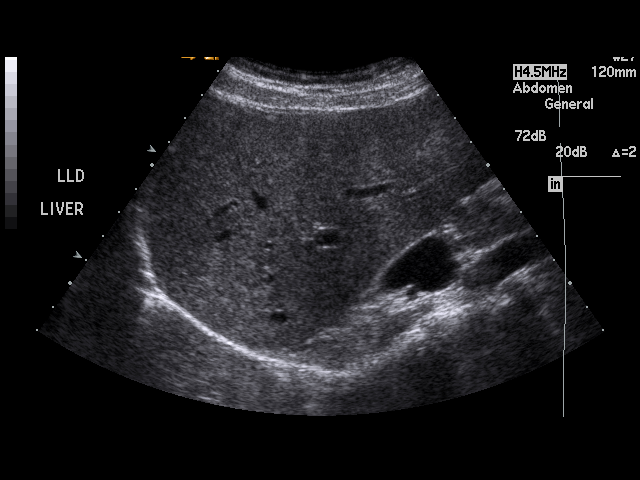
[im 29/46]
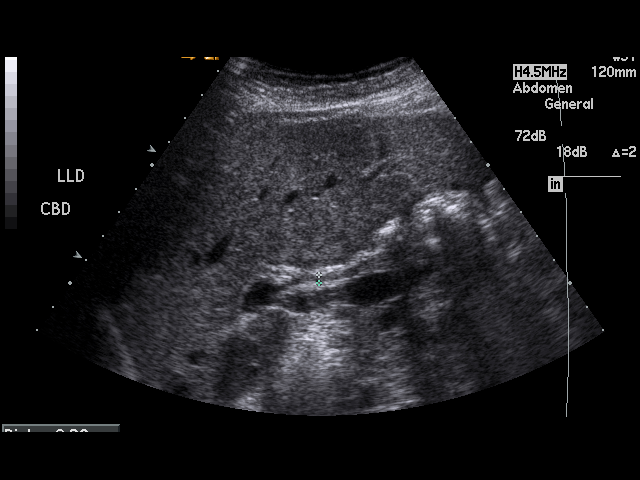
[im 31/46]
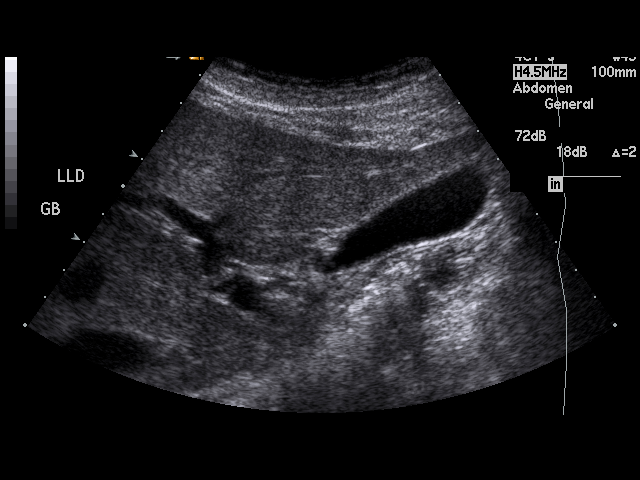
[im 34/46]
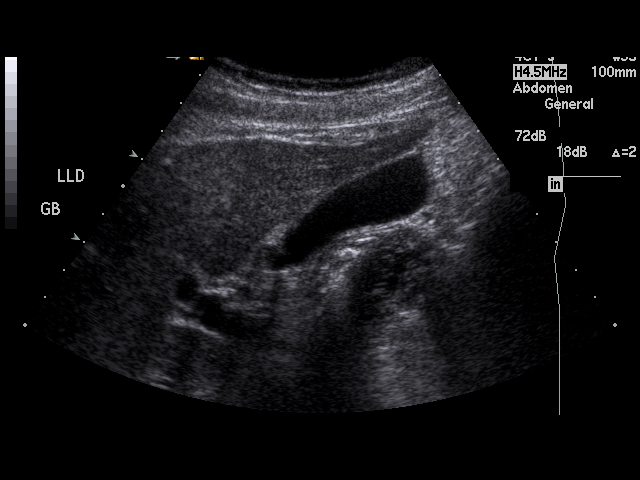
[im 36/46]
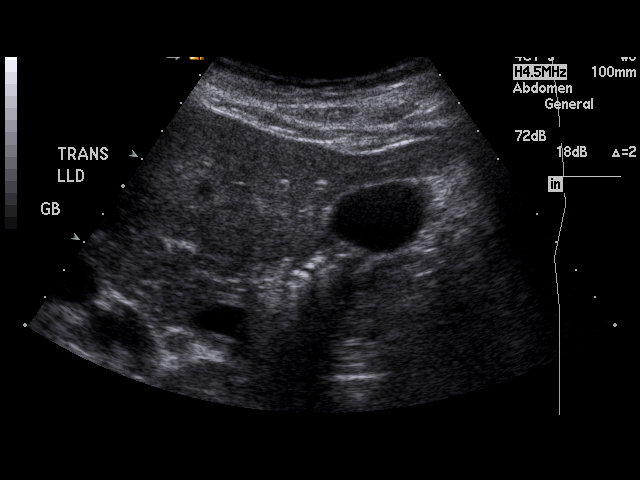
[im 40/46]
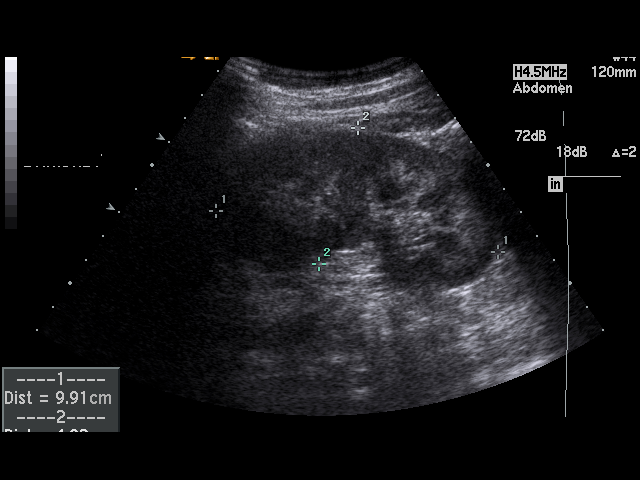
[im 42/46]
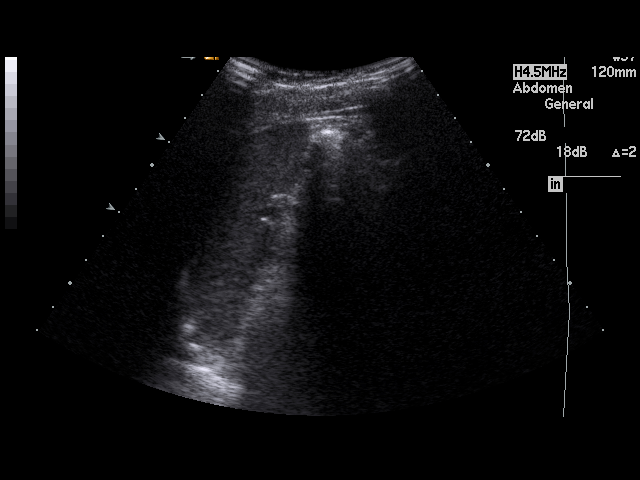
[im 46/46]
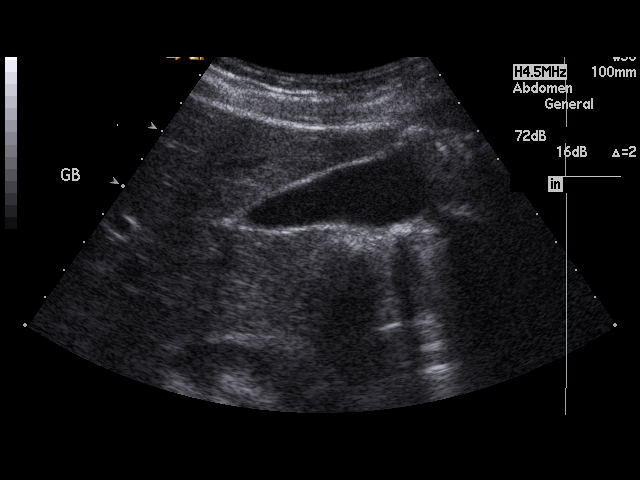

[17 of 25 positions shown; findings below may reference images not displayed]

FINDINGS: Visualized portions of the liver demonstrate normal echogenicity and normal
contours. The liver is without evidence of  focal hepatic lesion. There is
no intra or extrahepatic biliary ductal dilatation. The common duct measures
0.29 cm in maximal diameter. No cholelithiasis or biliary sludge. No
gallbladder wall thickening, pericholecystic fluid, or sonographic Murphy's
sign.

The visualized portion of the pancreas is normal in echogenicity. The spleen
is unremarkable. Bilateral kidneys are normal in echogenicity and size. The
right kidney measures 9.7 cm. The left kidney measures 9.9 cm. There are no
renal calculi or hydronephrosis. The abdominal aorta and IVC are
unremarkable.
IMPRESSION: Normal abdominal ultrasound.

## 2008-12-10 ENCOUNTER — Ambulatory Visit: Payer: Self-pay | Admitting: Obstetrics and Gynecology

## 2009-01-24 ENCOUNTER — Encounter: Payer: Self-pay | Admitting: Maternal & Fetal Medicine

## 2009-01-24 IMAGING — US US OB FOLLOW-UP - NRPT [facility]
1 series · 14 of 26 positions shown · non-contrast
Comparison: none

[Series 1: us ob follow-up - nrpt [facility] · 14 of 26 slices shown]
[im 1/26]
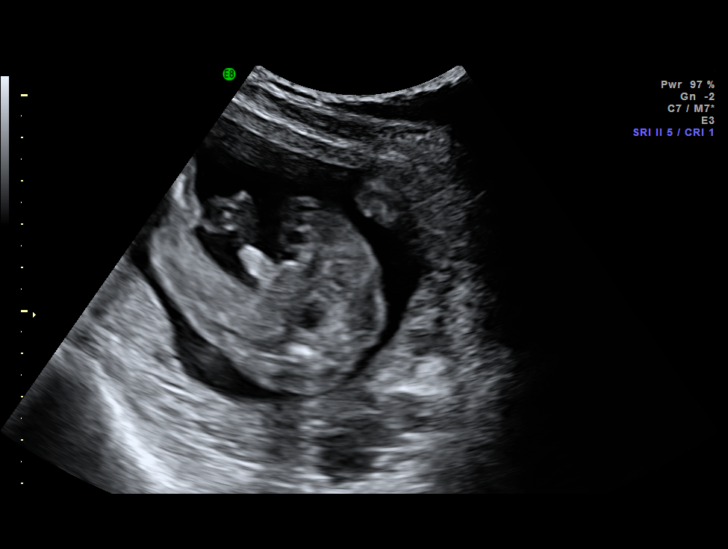
[im 3/26]
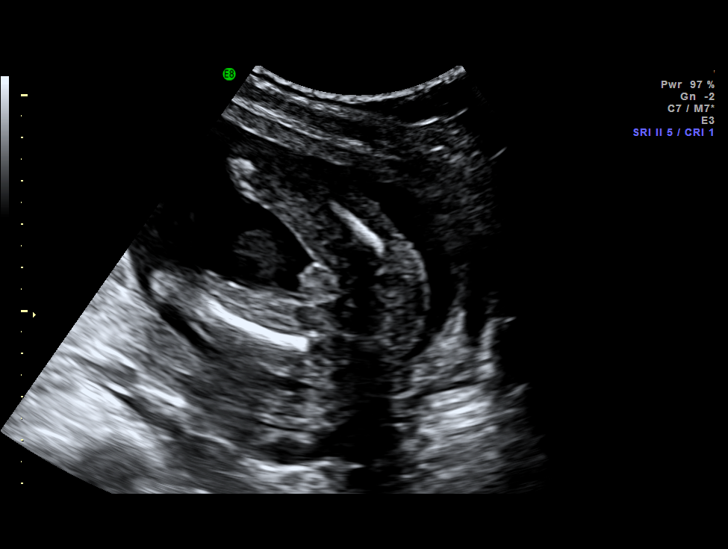
[im 5/26]
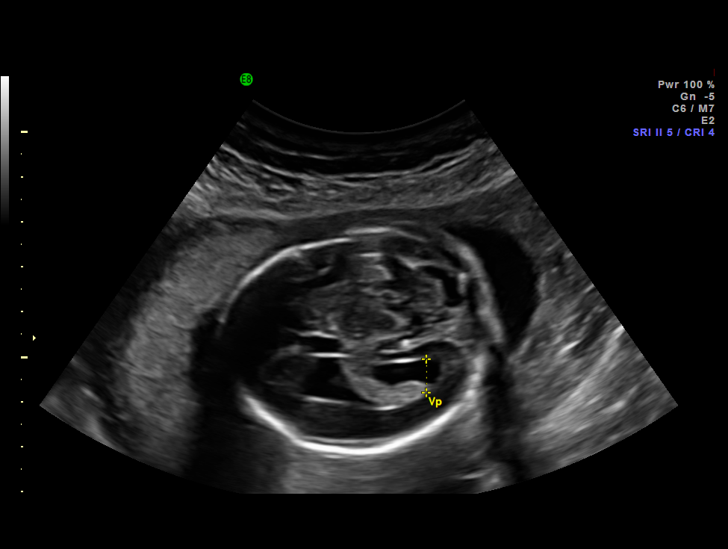
[im 7/26]
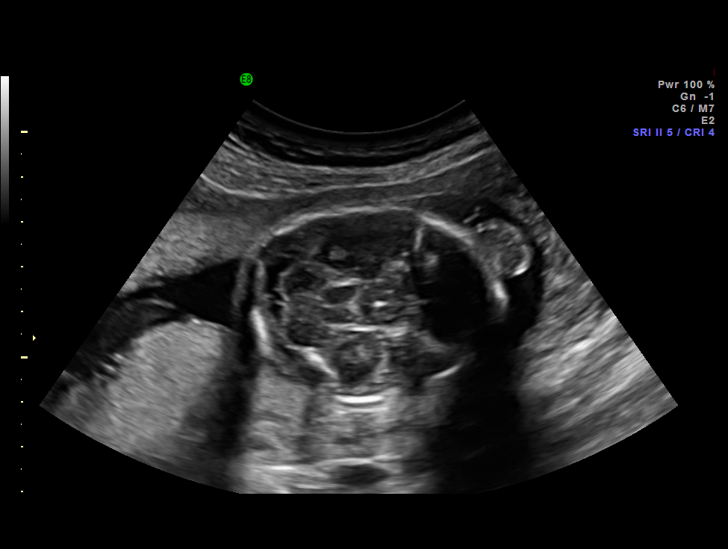
[im 9/26]
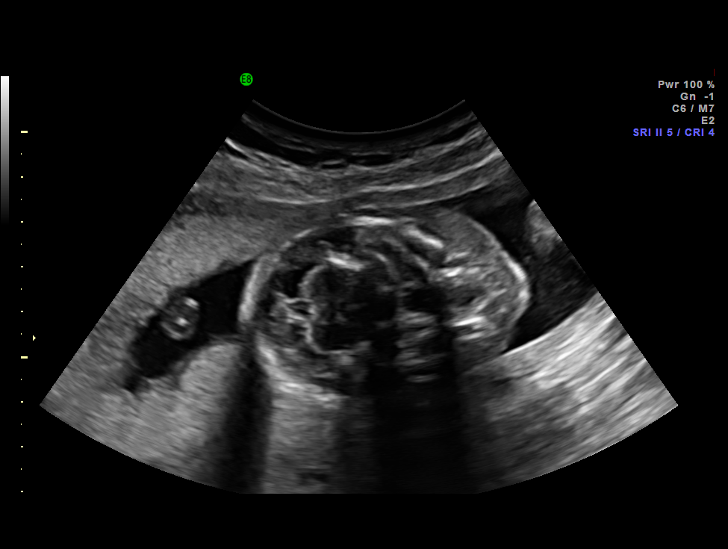
[im 11/26]
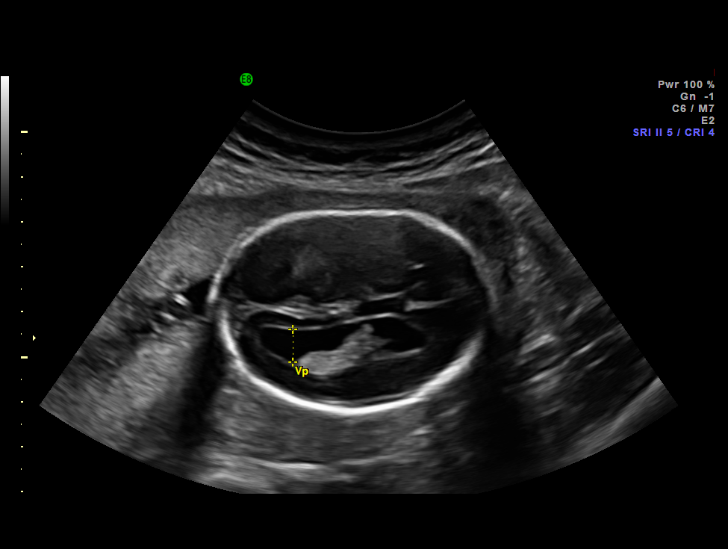
[im 13/26]
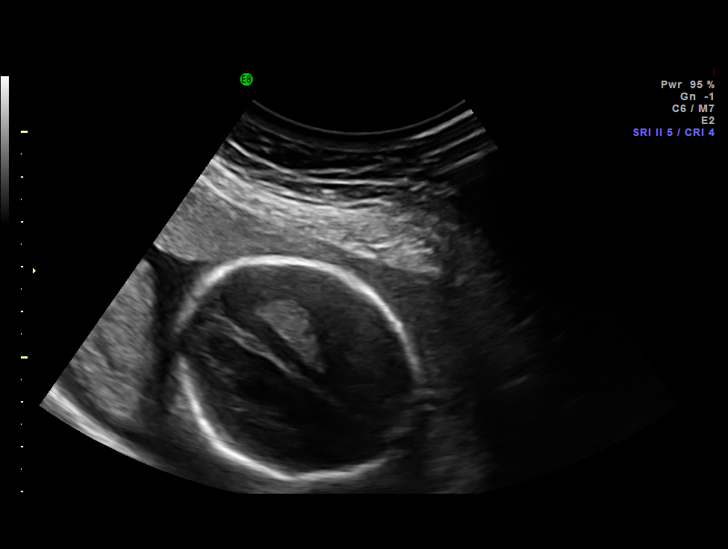
[im 14/26]
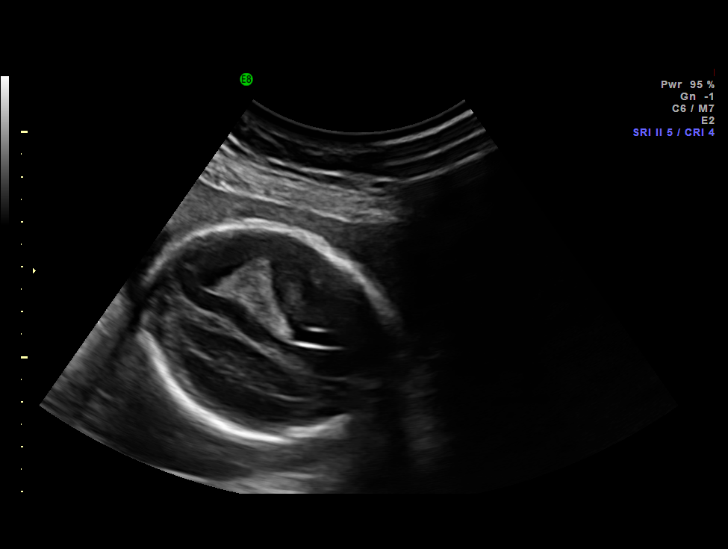
[im 16/26]
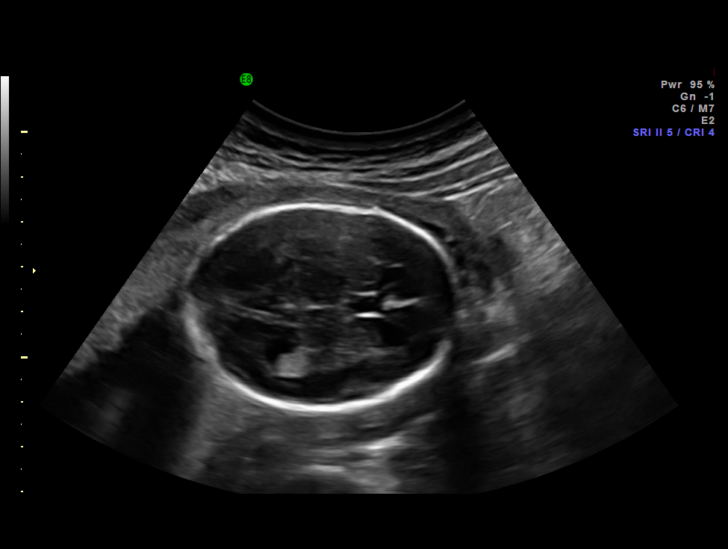
[im 18/26]
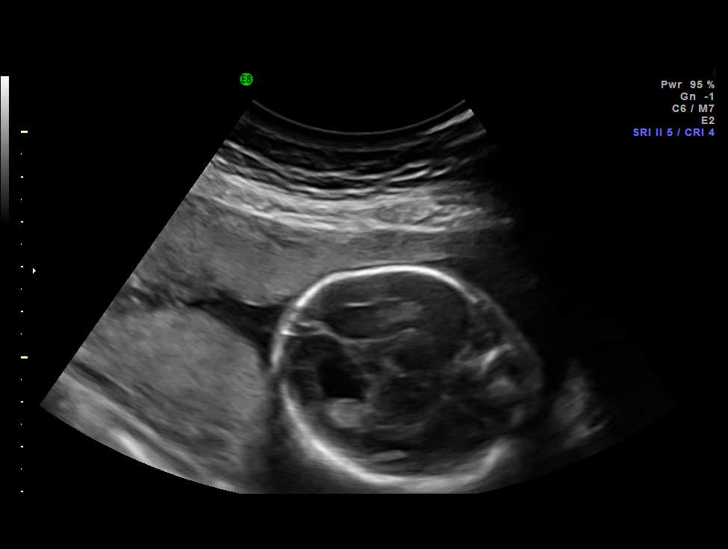
[im 20/26]
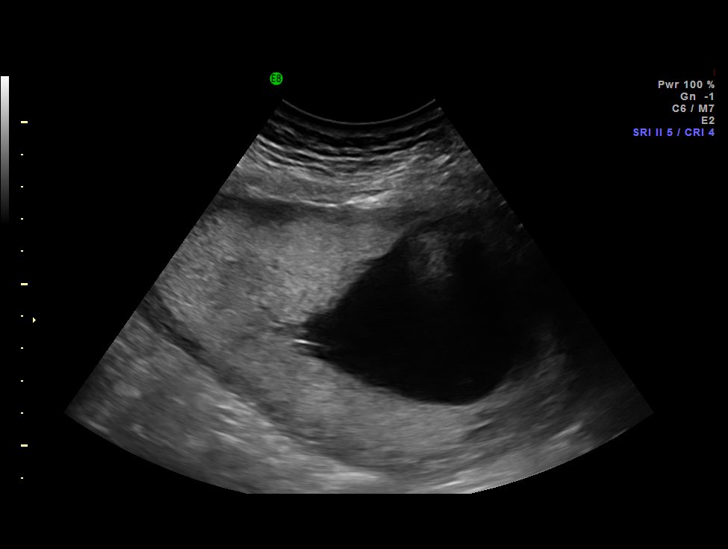
[im 22/26]
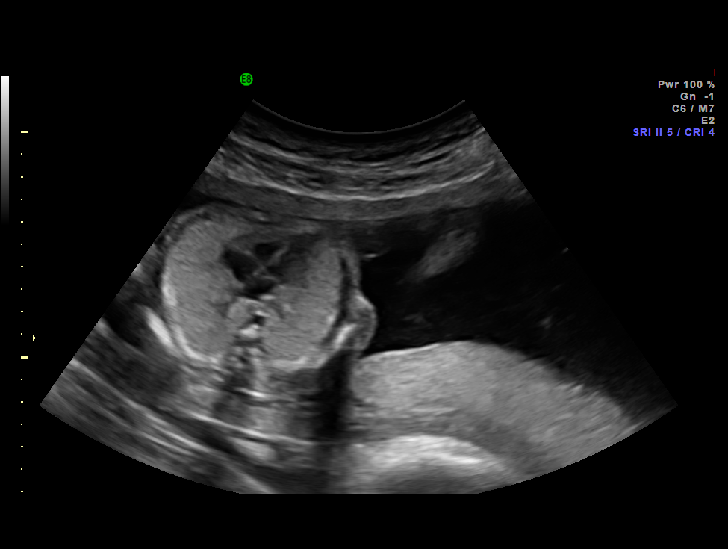
[im 24/26]
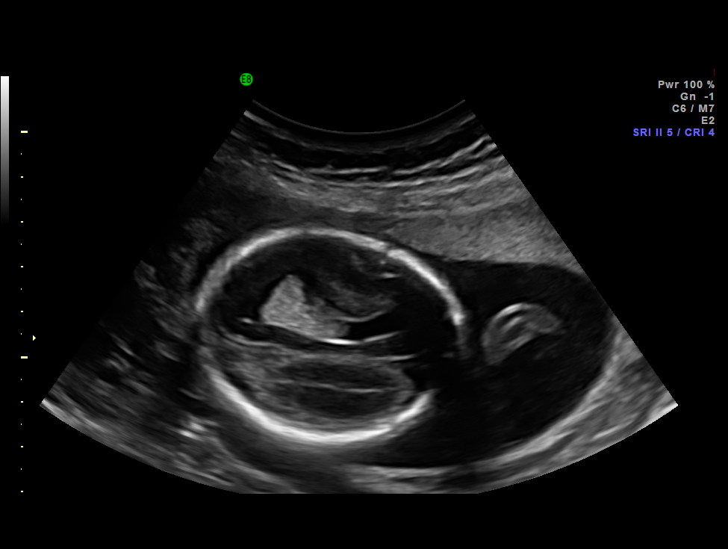
[im 26/26]
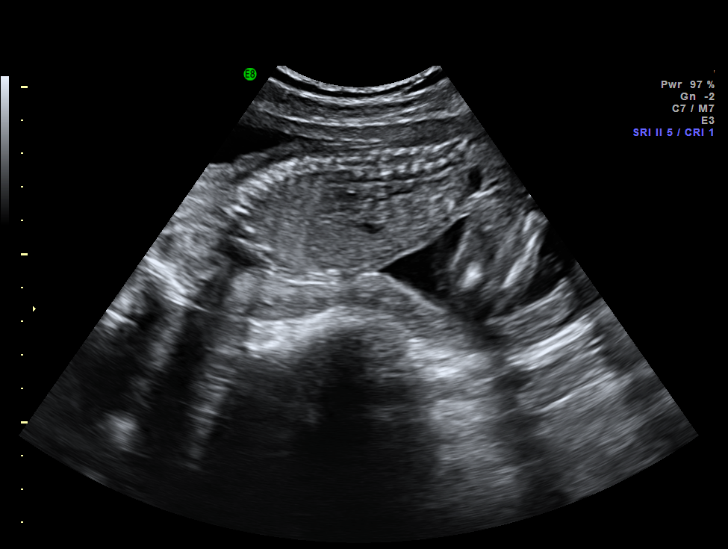

[14 of 26 positions shown; findings below may reference images not displayed]

IMAGES IMPORTED FROM THE SYNGO WORKFLOW SYSTEM
NO DICTATION FOR STUDY

## 2009-02-12 ENCOUNTER — Observation Stay: Payer: Self-pay | Admitting: Obstetrics and Gynecology

## 2009-02-21 ENCOUNTER — Encounter: Payer: Self-pay | Admitting: Maternal & Fetal Medicine

## 2009-02-21 IMAGING — US US OB FOLLOW-UP - NRPT [facility]
1 series · 14 of 28 positions shown · non-contrast
Comparison: none

[Series 1: us ob follow-up - nrpt [facility] · 14 of 57 slices shown]
[im 3/57]
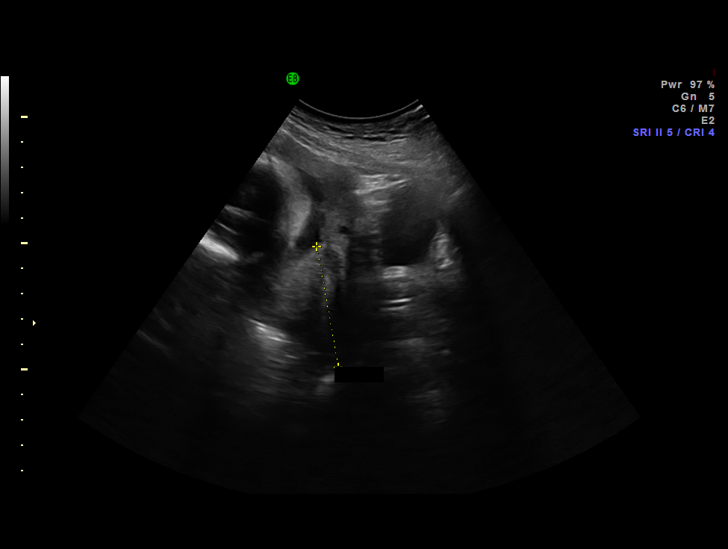
[im 7/57]
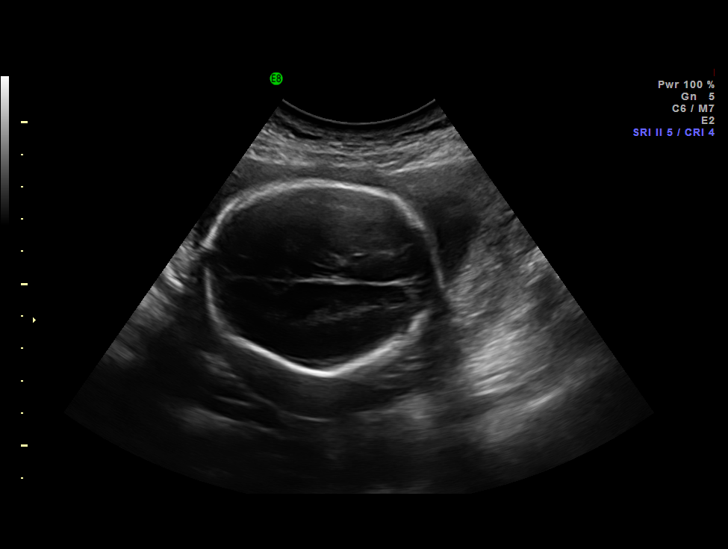
[im 11/57]
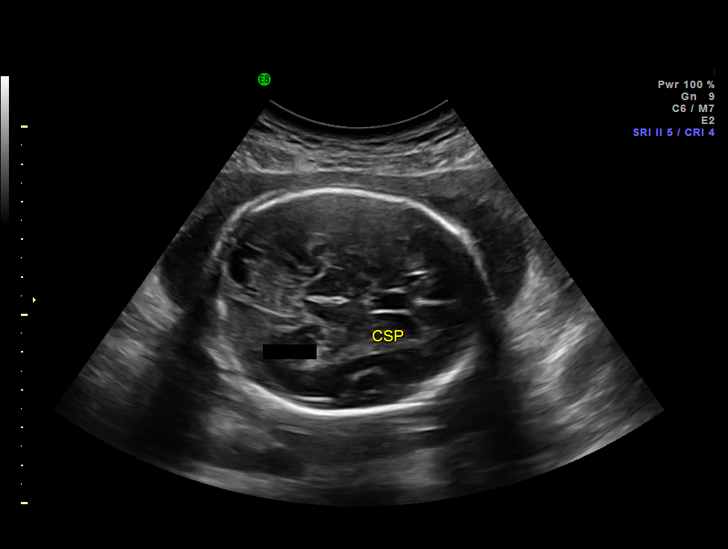
[im 15/57]
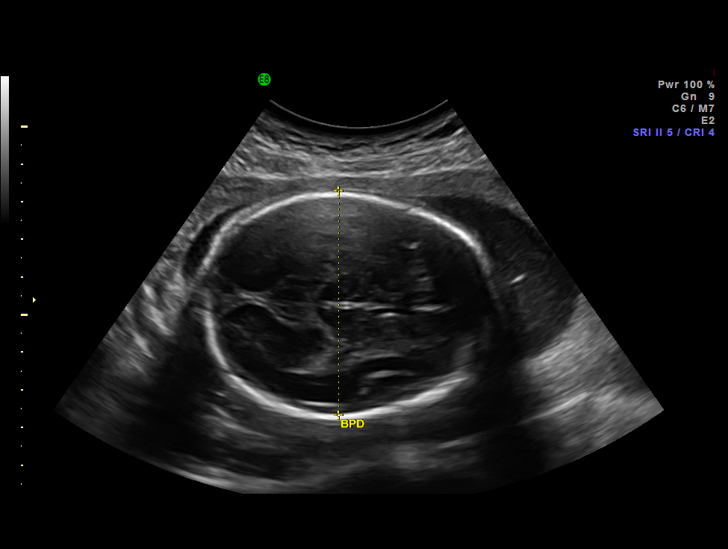
[im 19/57]
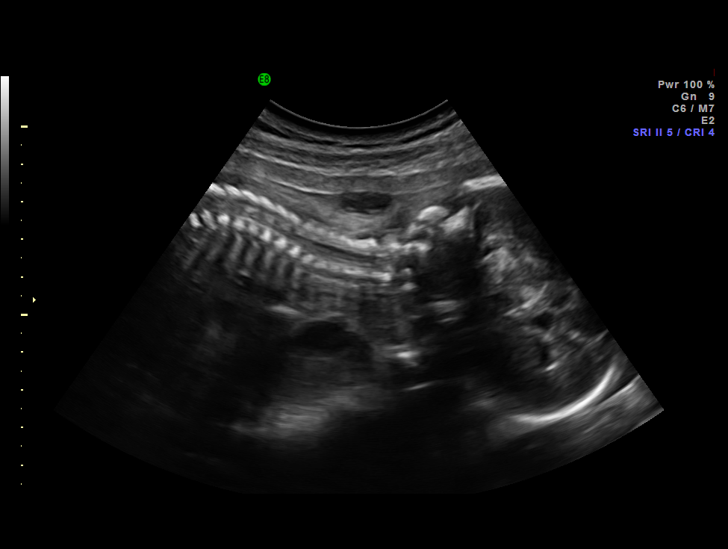
[im 23/57]
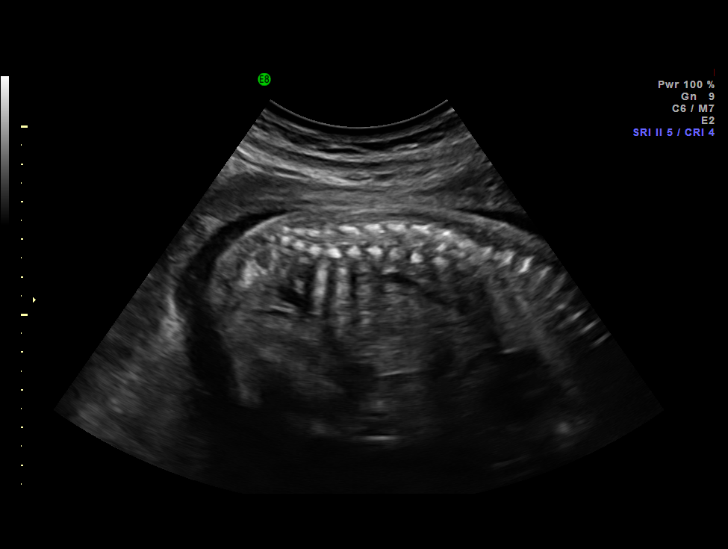
[im 27/57]
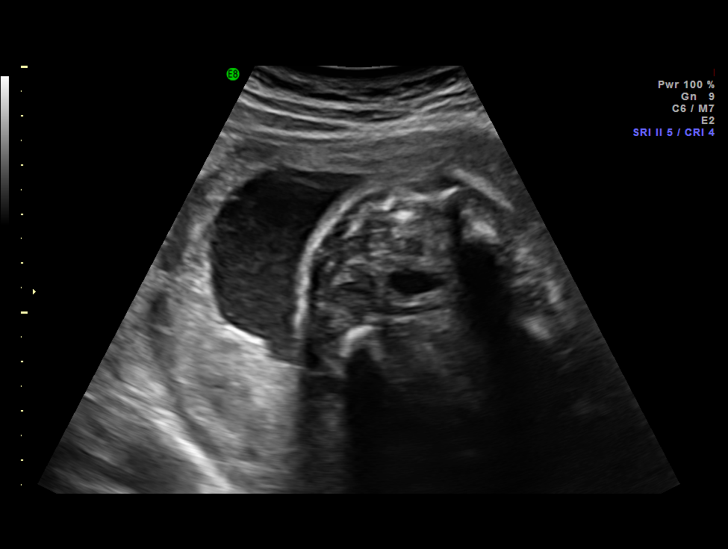
[im 32/57]
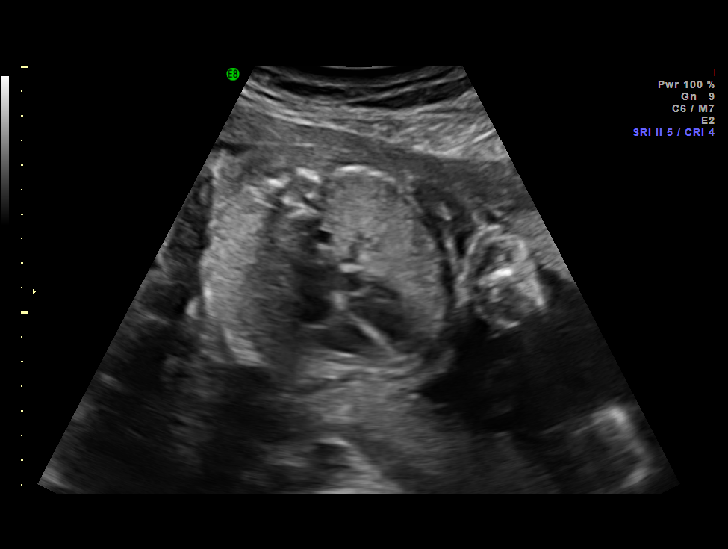
[im 36/57]
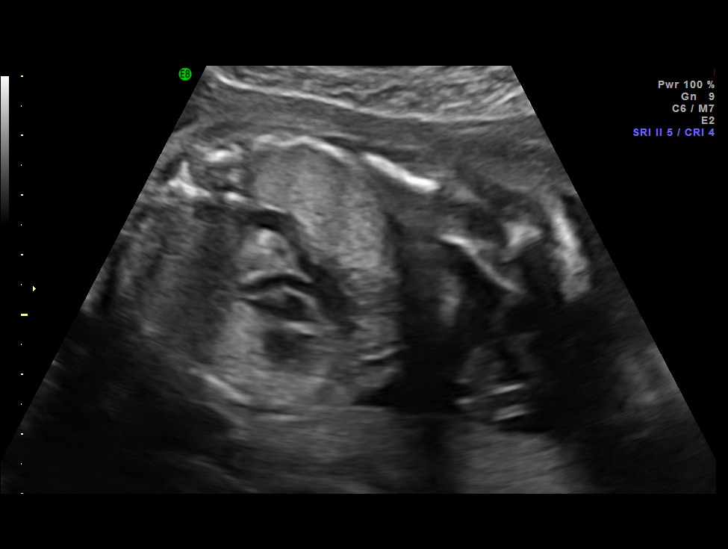
[im 40/57]
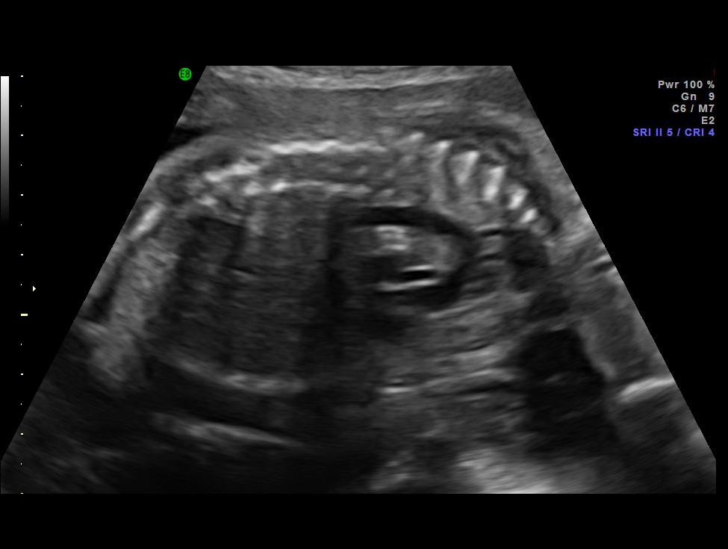
[im 44/57]
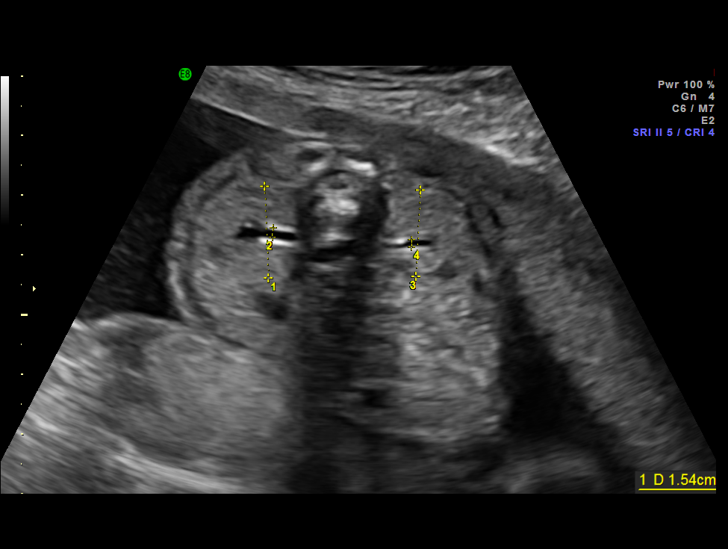
[im 48/57]
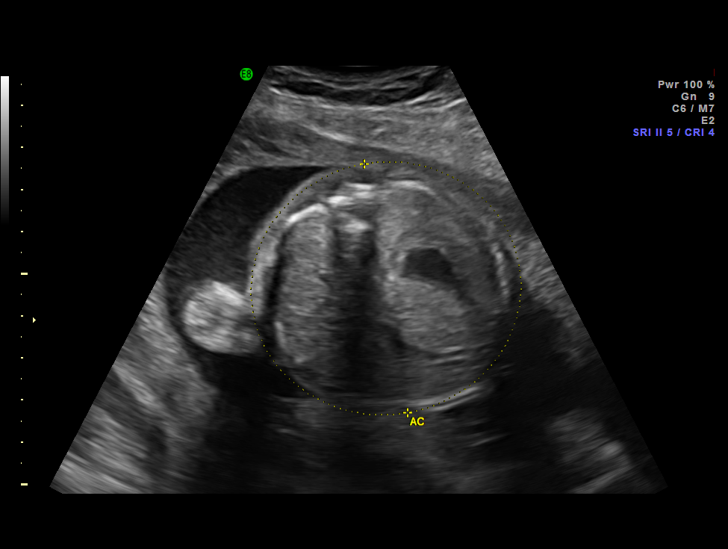
[im 52/57]
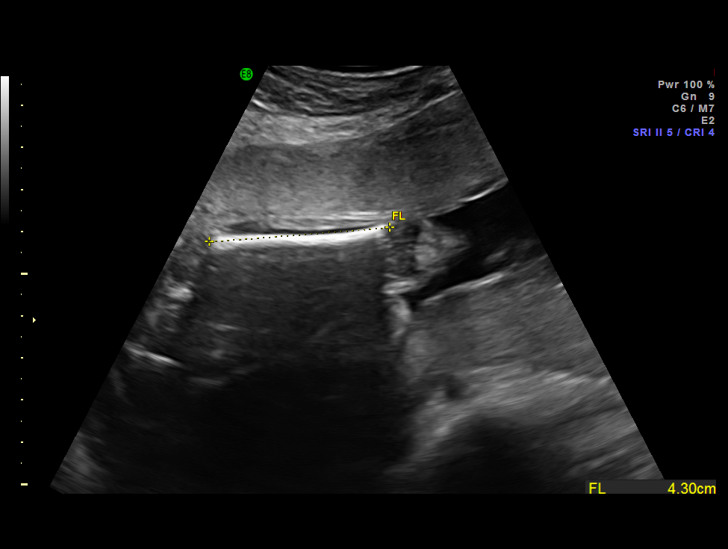
[im 57/57]
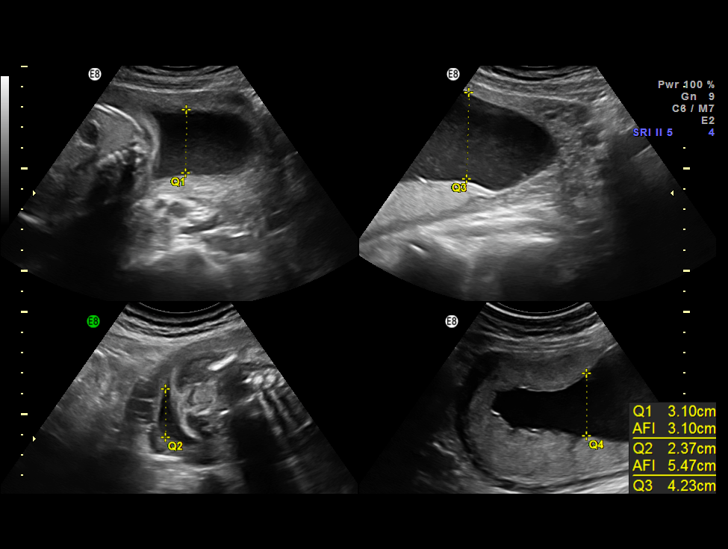

[14 of 28 positions shown; findings below may reference images not displayed]

IMAGES IMPORTED FROM THE SYNGO WORKFLOW SYSTEM
NO DICTATION FOR STUDY

## 2009-03-14 ENCOUNTER — Other Ambulatory Visit: Payer: Self-pay | Admitting: Obstetrics and Gynecology

## 2009-03-18 ENCOUNTER — Other Ambulatory Visit: Payer: Self-pay | Admitting: Obstetrics and Gynecology

## 2009-05-02 ENCOUNTER — Encounter: Payer: Self-pay | Admitting: Obstetrics and Gynecology

## 2009-05-02 IMAGING — US US OB FOLLOW-UP - [facility]PT [facility]
1 series · 14 of 28 positions shown · non-contrast
Comparison: none

[Series 1: us ob follow-up - nrpt [facility] · 14 of 54 slices shown]
[im 2/54]
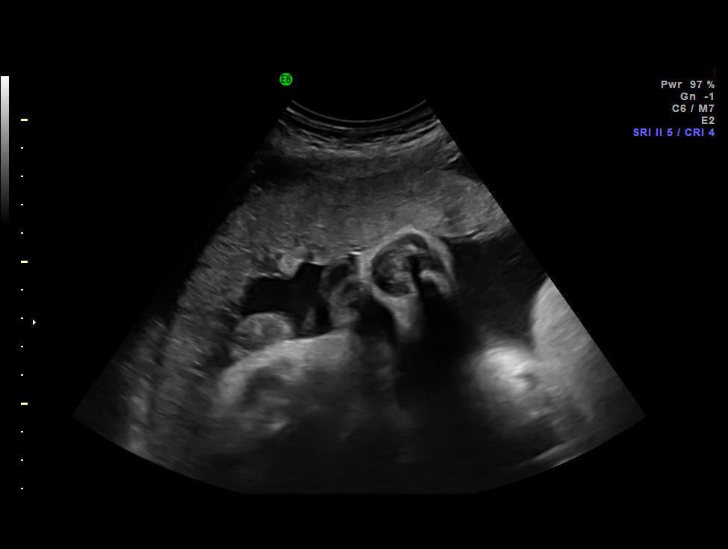
[im 6/54]
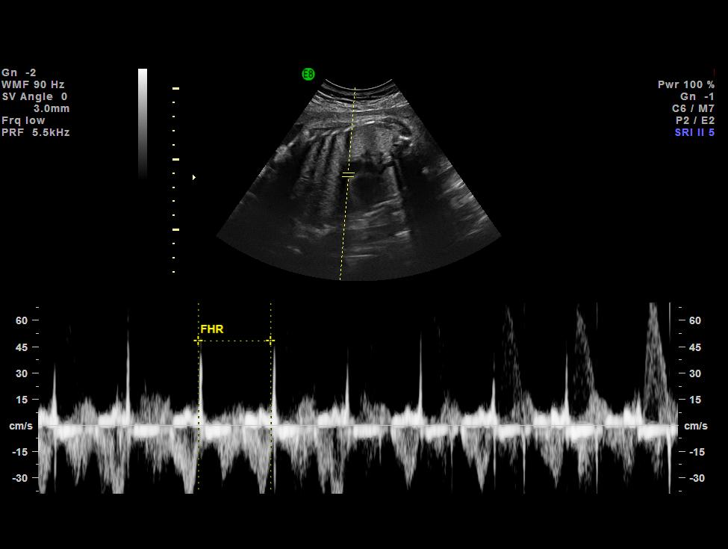
[im 10/54]
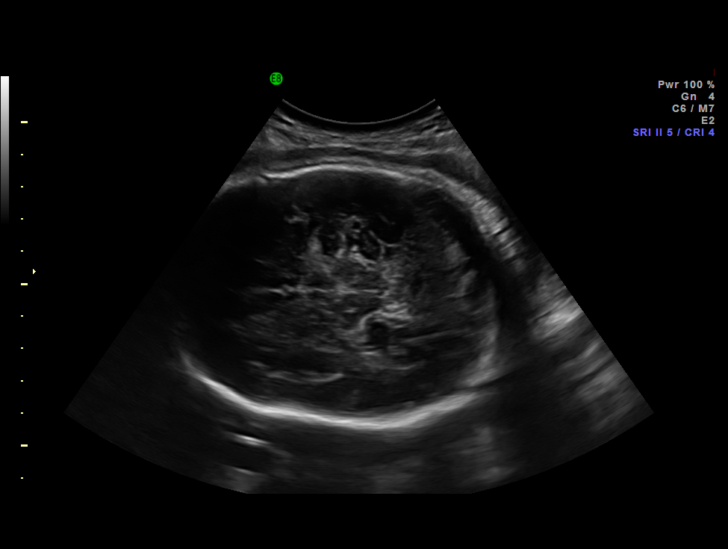
[im 14/54]
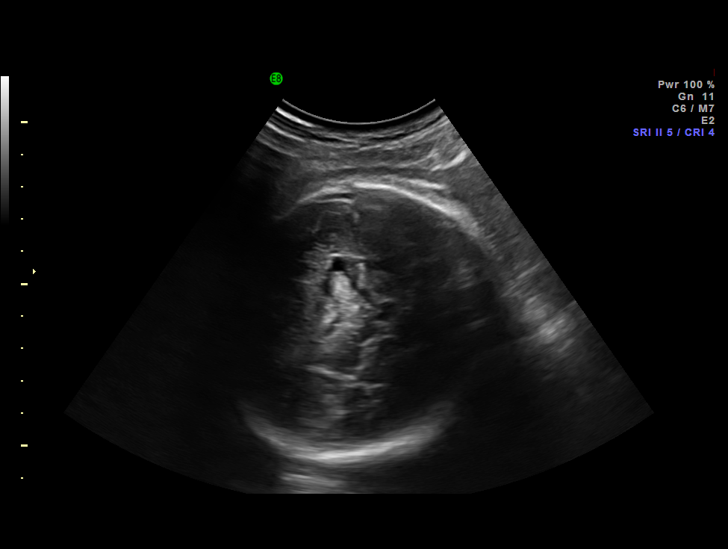
[im 18/54]
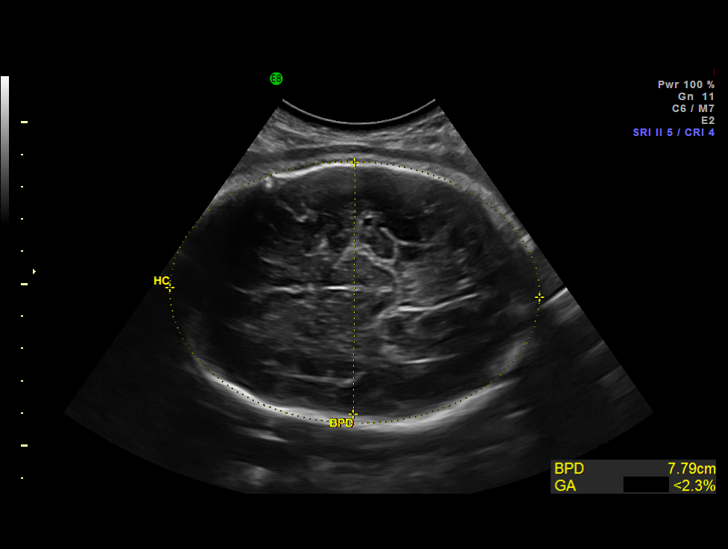
[im 22/54]
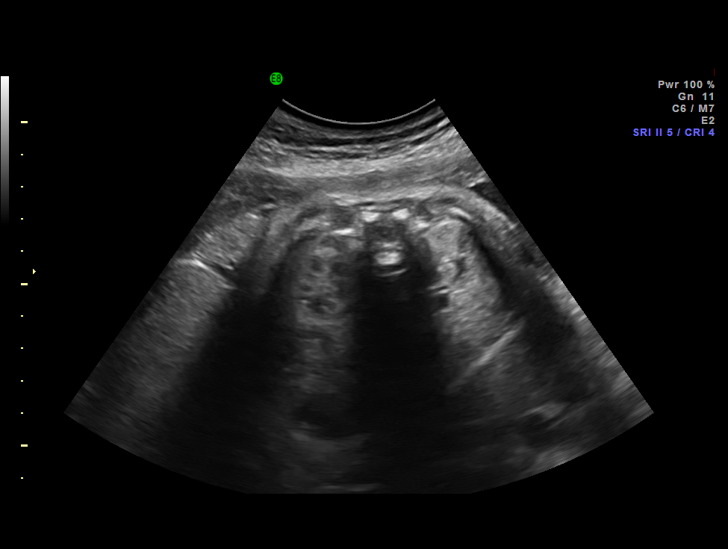
[im 26/54]
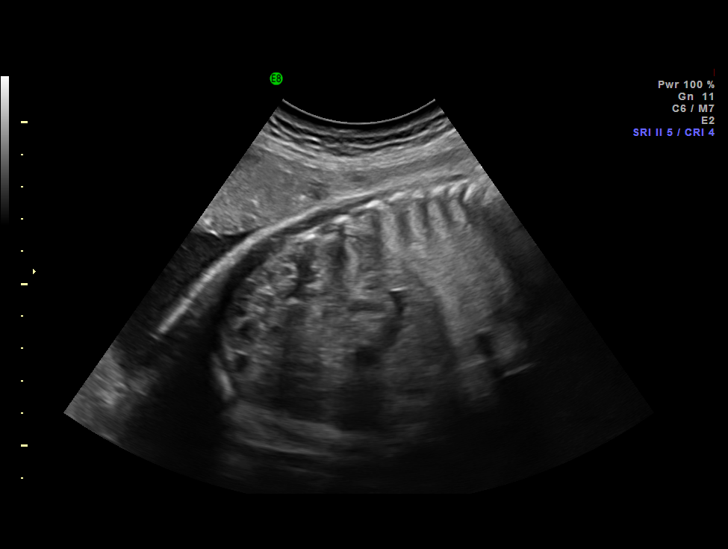
[im 30/54]
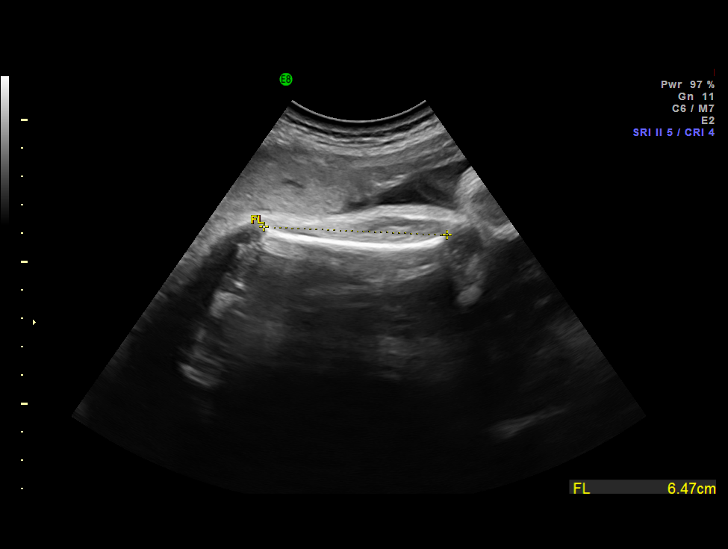
[im 34/54]
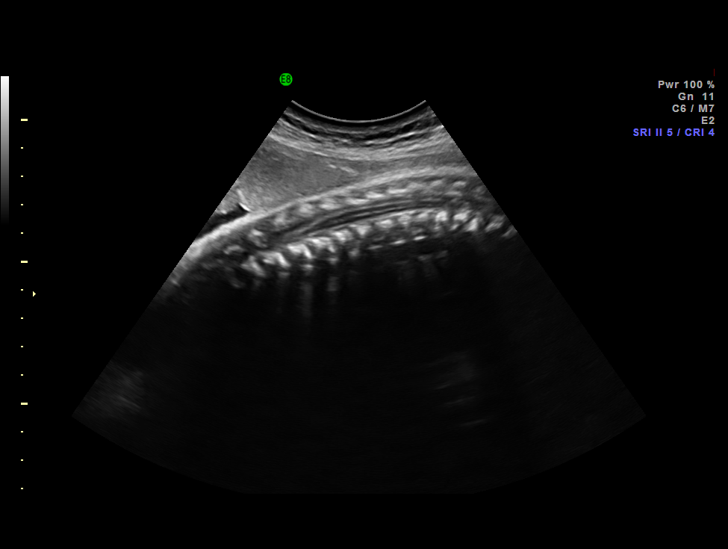
[im 38/54]
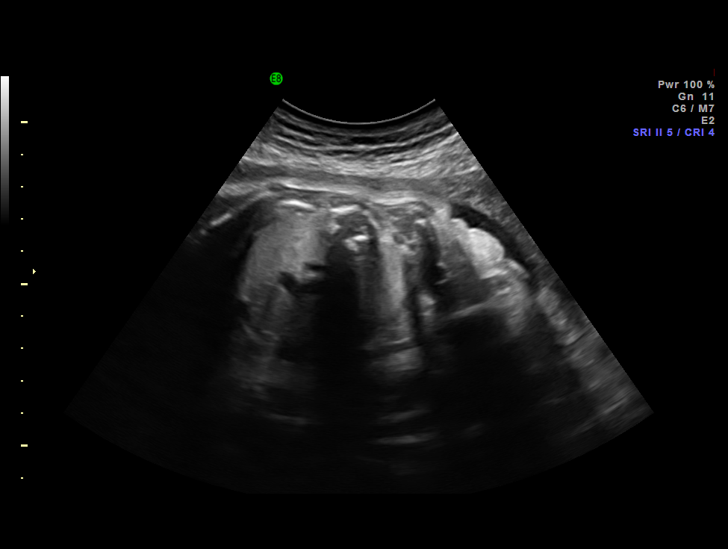
[im 42/54]
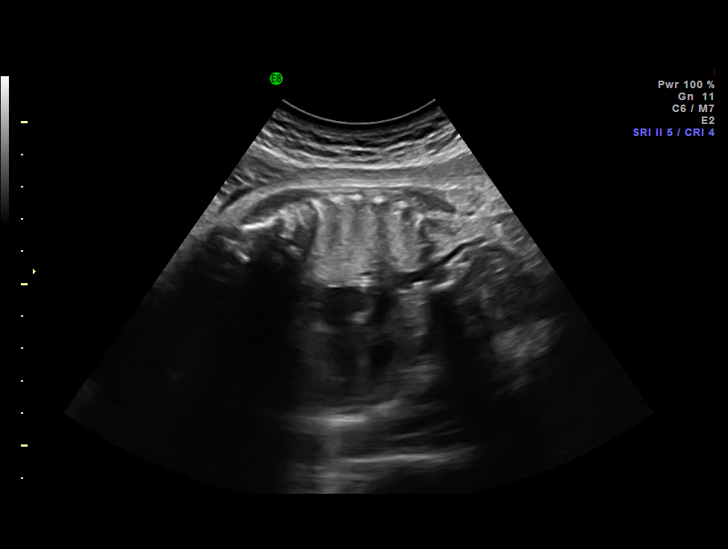
[im 46/54]
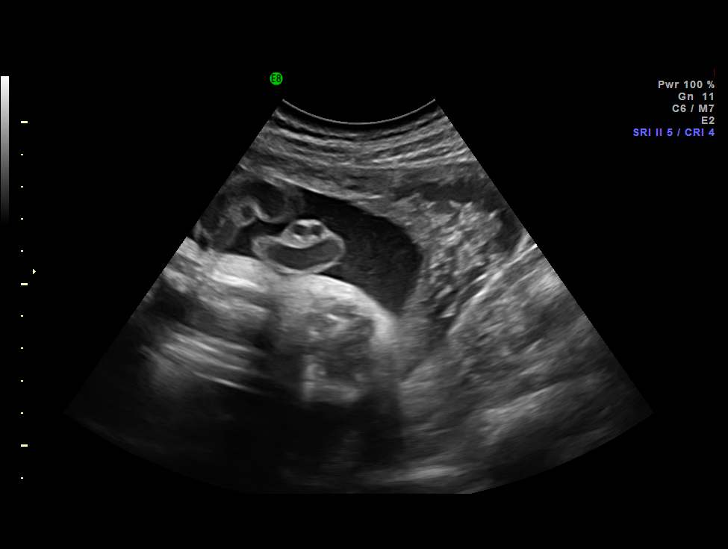
[im 50/54]
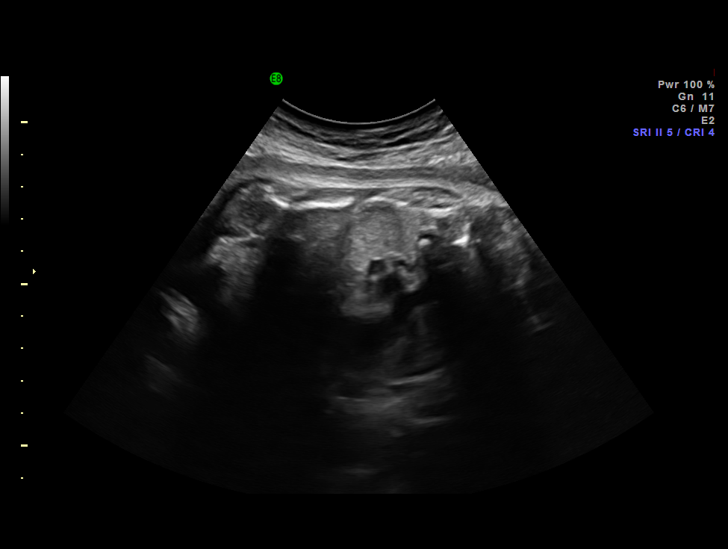
[im 54/54]
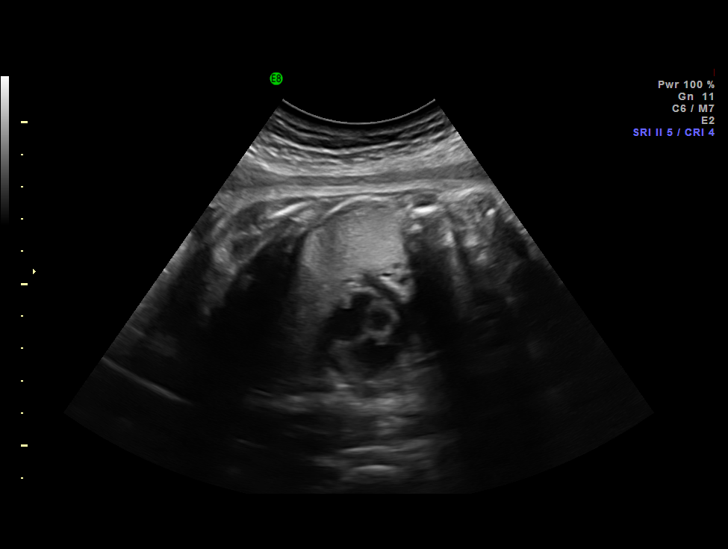

[14 of 28 positions shown; findings below may reference images not displayed]

IMAGES IMPORTED FROM THE SYNGO WORKFLOW SYSTEM
NO DICTATION FOR STUDY

## 2009-05-05 ENCOUNTER — Other Ambulatory Visit: Payer: Self-pay | Admitting: Obstetrics and Gynecology

## 2009-05-13 ENCOUNTER — Other Ambulatory Visit: Payer: Self-pay | Admitting: Obstetrics and Gynecology

## 2009-05-19 ENCOUNTER — Other Ambulatory Visit: Payer: Self-pay | Admitting: Obstetrics and Gynecology

## 2009-06-16 ENCOUNTER — Inpatient Hospital Stay: Payer: Self-pay | Admitting: Obstetrics and Gynecology

## 2009-10-29 ENCOUNTER — Emergency Department: Payer: Self-pay | Admitting: Emergency Medicine

## 2010-04-25 ENCOUNTER — Emergency Department: Payer: Self-pay | Admitting: Emergency Medicine

## 2011-04-13 ENCOUNTER — Emergency Department: Payer: Self-pay | Admitting: Unknown Physician Specialty

## 2011-04-13 IMAGING — CR DG CHEST 2V
1 series · 2 of 2 positions shown · non-contrast
Comparison: none

REASON FOR EXAM: fever sob
COMMENTS:

PROCEDURE:     DXR - DXR CHEST PA (OR AP) AND LATERAL  - [DATE]  [DATE]
RESULT:     Comparison is made to the prior exam of [DATE].
The lung fields remain clear. No pneumonia, pneumothorax or pleural effusion
is seen. The heart size is normal. A mild thoracolumbar scoliosis is noted.

[Series 1: view not recorded · 0.17mm/px · 2 of 2 slices shown]
[im 1/2]
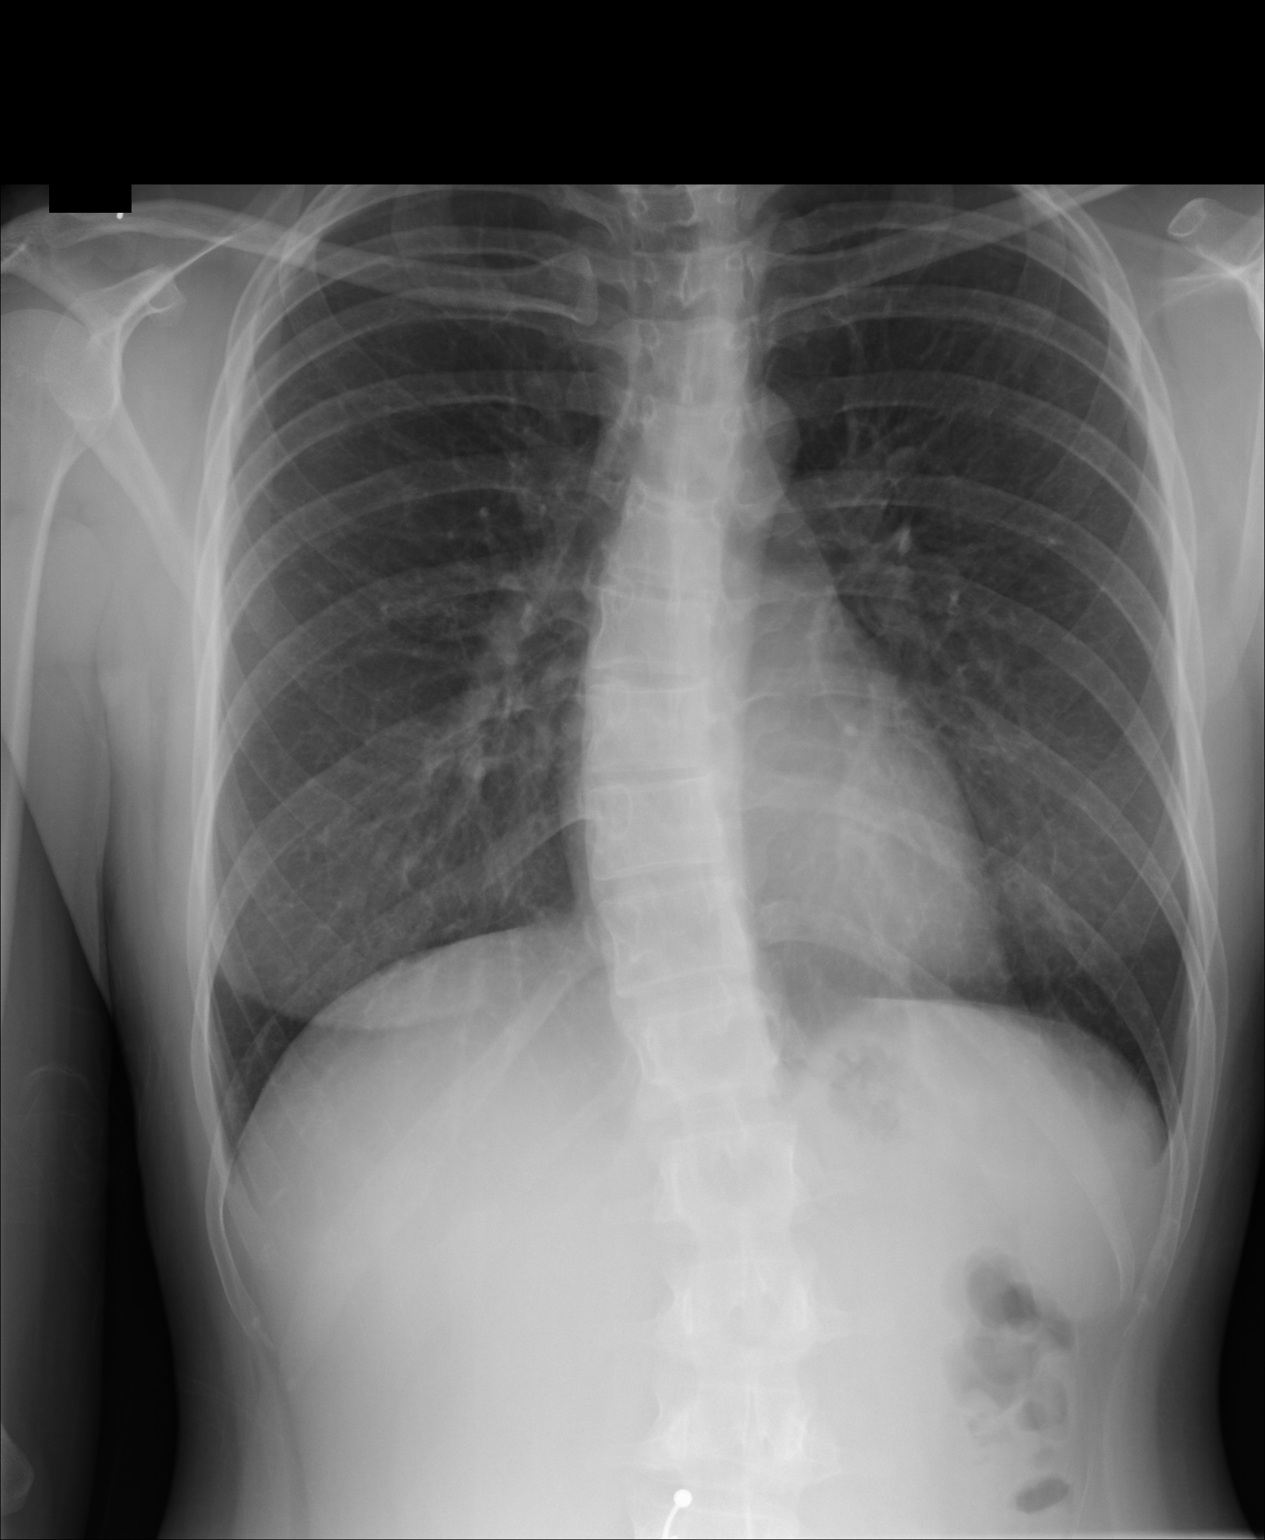
[im 2/2]
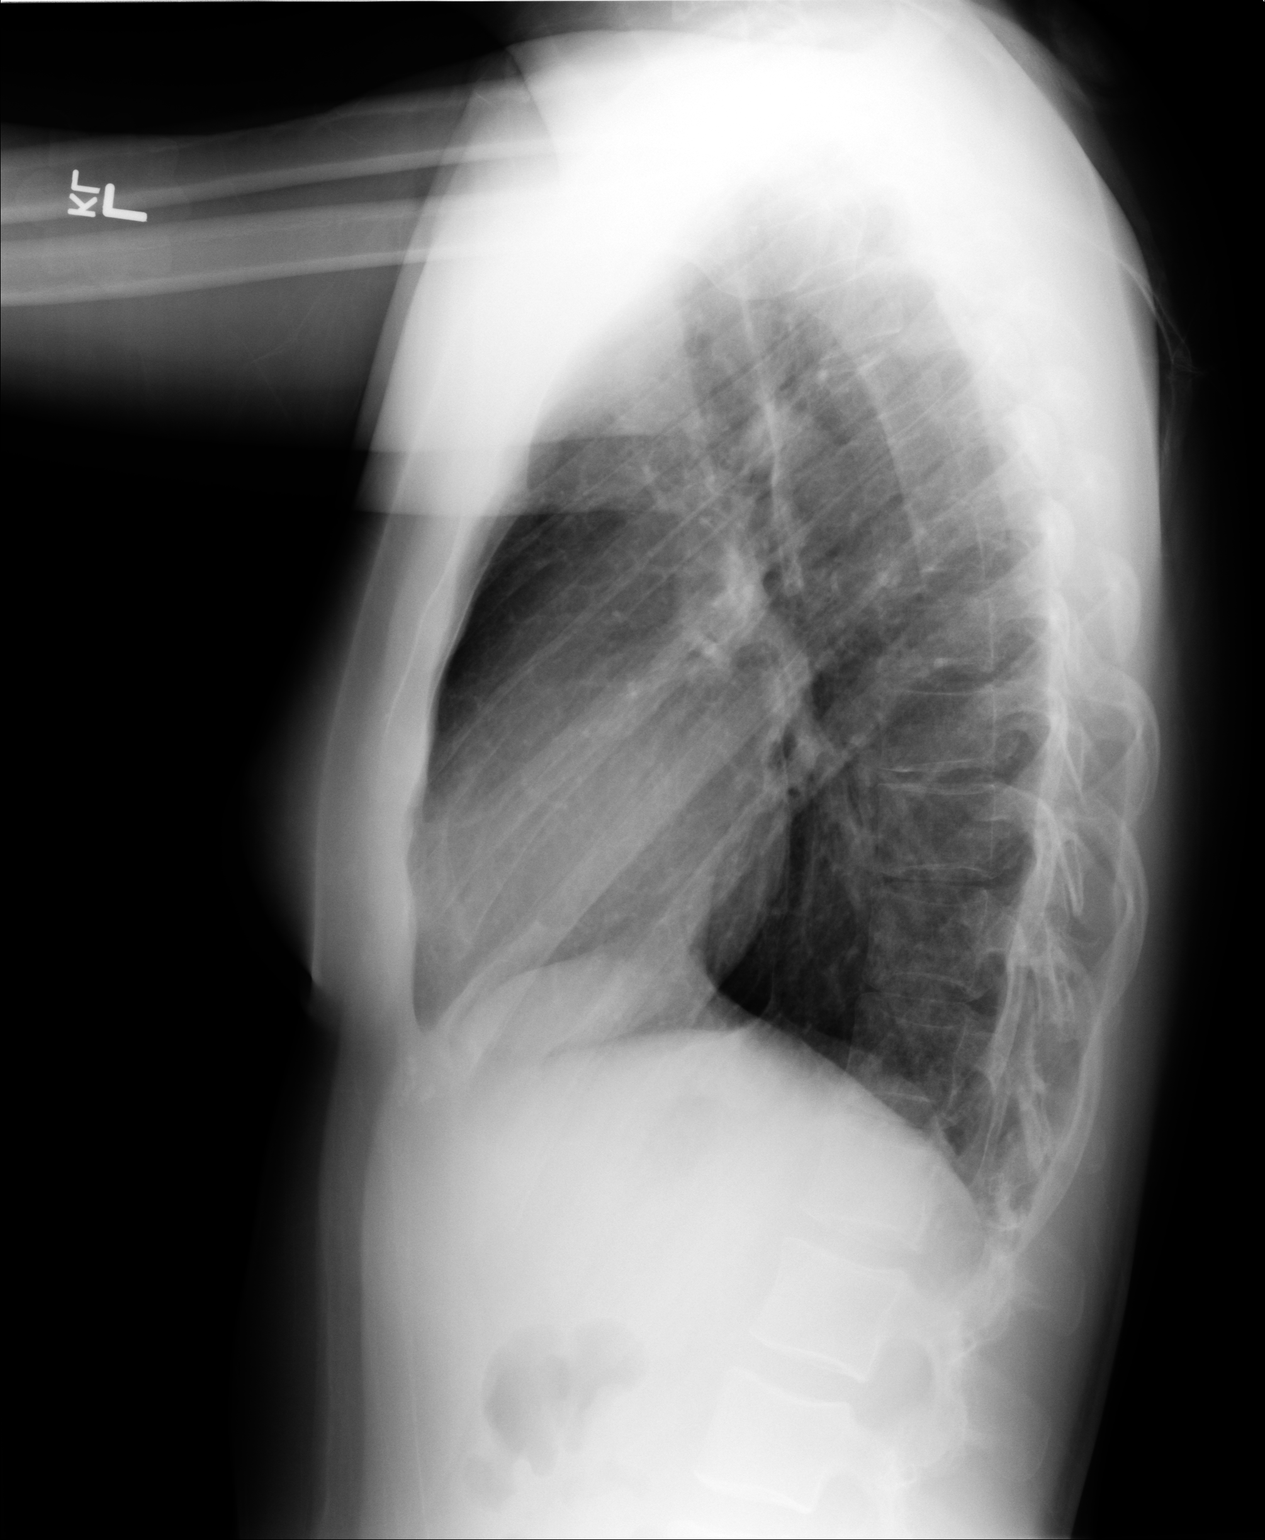

[2 of 2 positions shown; findings below may reference images not displayed]

IMPRESSION: 1.  Stable appearing chest as compared to the exam of [DATE].
[DATE].  The lung fields are clear.
3.  A thoracolumbar scoliosis is again observed.

## 2012-05-02 ENCOUNTER — Emergency Department: Payer: Self-pay | Admitting: *Deleted

## 2012-10-27 ENCOUNTER — Emergency Department: Payer: Self-pay | Admitting: Emergency Medicine

## 2014-04-12 LAB — HM PAP SMEAR

## 2014-07-24 ENCOUNTER — Emergency Department: Payer: Self-pay | Admitting: Internal Medicine

## 2014-07-24 LAB — CBC WITH DIFFERENTIAL/PLATELET
Basophil #: 0 10*3/uL (ref 0.0–0.1)
Basophil %: 0.4 %
Eosinophil #: 0 10*3/uL (ref 0.0–0.7)
Eosinophil %: 0.3 %
HCT: 44.3 % (ref 35.0–47.0)
HGB: 15 g/dL (ref 12.0–16.0)
Lymphocyte #: 1.4 10*3/uL (ref 1.0–3.6)
Lymphocyte %: 13.2 %
MCH: 32 pg (ref 26.0–34.0)
MCHC: 33.9 g/dL (ref 32.0–36.0)
MCV: 95 fL (ref 80–100)
Monocyte #: 0.5 x10 3/mm (ref 0.2–0.9)
Monocyte %: 4.5 %
Neutrophil #: 8.7 10*3/uL — ABNORMAL HIGH (ref 1.4–6.5)
Neutrophil %: 81.6 %
Platelet: 285 10*3/uL (ref 150–440)
RBC: 4.68 10*6/uL (ref 3.80–5.20)
RDW: 12.4 % (ref 11.5–14.5)
WBC: 10.7 10*3/uL (ref 3.6–11.0)

## 2014-07-24 LAB — COMPREHENSIVE METABOLIC PANEL
Albumin: 4.2 g/dL (ref 3.4–5.0)
Alkaline Phosphatase: 103 U/L
Anion Gap: 9 (ref 7–16)
BUN: 12 mg/dL (ref 7–18)
Bilirubin,Total: 1.3 mg/dL — ABNORMAL HIGH (ref 0.2–1.0)
Calcium, Total: 8.9 mg/dL (ref 8.5–10.1)
Chloride: 109 mmol/L — ABNORMAL HIGH (ref 98–107)
Co2: 27 mmol/L (ref 21–32)
Creatinine: 0.87 mg/dL (ref 0.60–1.30)
EGFR (African American): >60
EGFR (Non-African Amer.): >60
Glucose: 94 mg/dL (ref 65–99)
Osmolality: 288 (ref 275–301)
Potassium: 3.8 mmol/L (ref 3.5–5.1)
SGOT(AST): 32 U/L (ref 15–37)
SGPT (ALT): 33 U/L
Sodium: 145 mmol/L (ref 136–145)
Total Protein: 7.8 g/dL (ref 6.4–8.2)

## 2016-11-29 ENCOUNTER — Ambulatory Visit (INDEPENDENT_AMBULATORY_CARE_PROVIDER_SITE_OTHER): Payer: BLUE CROSS/BLUE SHIELD | Admitting: Family Medicine

## 2016-11-29 ENCOUNTER — Encounter: Payer: Self-pay | Admitting: Family Medicine

## 2016-11-29 VITALS — BP 110/64 | HR 80 | Temp 98.2°F | Resp 18 | Ht 65.0 in | Wt 146.4 lb

## 2016-11-29 DIAGNOSIS — Z8659 Personal history of other mental and behavioral disorders: Secondary | ICD-10-CM | POA: Insufficient documentation

## 2016-11-29 DIAGNOSIS — Z720 Tobacco use: Secondary | ICD-10-CM

## 2016-11-29 DIAGNOSIS — F32A Depression, unspecified: Secondary | ICD-10-CM | POA: Insufficient documentation

## 2016-11-29 DIAGNOSIS — G43109 Migraine with aura, not intractable, without status migrainosus: Secondary | ICD-10-CM

## 2016-11-29 DIAGNOSIS — M542 Cervicalgia: Secondary | ICD-10-CM | POA: Insufficient documentation

## 2016-11-29 DIAGNOSIS — M419 Scoliosis, unspecified: Secondary | ICD-10-CM

## 2016-11-29 DIAGNOSIS — R519 Headache, unspecified: Secondary | ICD-10-CM | POA: Insufficient documentation

## 2016-11-29 DIAGNOSIS — F320 Major depressive disorder, single episode, mild: Secondary | ICD-10-CM

## 2016-11-29 DIAGNOSIS — R51 Headache: Secondary | ICD-10-CM

## 2016-11-29 MED ORDER — BUPROPION HCL ER (XL) 150 MG PO TB24: 150.0000 mg | ORAL_TABLET | Freq: Every day | ORAL | 1 refills | Status: DC

## 2016-11-29 NOTE — Progress Notes (Signed)
Name: Hannah Gregory   MRN: [id number]    DOB: January 24, 1981   Date:11/29/2016       Progress Note  Subjective  Chief Complaint  Chief Complaint  Patient presents with  . Establish Care  . Neck Pain    Has been going on for years, states her neck will make "popping noise". Will use heating pads and Icy Hot to help.  . Migraine    Goes to specialist [facility], every 2 weeks for steriod injections.     HPI  Migraine headaches: she [facility], she is on Zonegran and steroid injections every 2 weeks, she  states migraine is down to about once every two weeks. She continues to have daily headaches, described as a nagging pressure on both temples and behind eyes, pain is 4/10. She is not longer taking otc medications.    Neck Pain: she states that she has daily neck pain, described as burning inside of her neck and at times she pops her neck ( over the past few months ), she has numbness on both hands and legs intermittently - but had a NCS done and was normal , she also had lab work but she does not know the results. She states symptoms started about 1 years ago. She has been a Database administrator for many years.   Scoliosis: she has a long history of scoliosis and has occasional back pain, but not severe.   Mild depression: she states she feels sad, cries at night, feels overwhelmed raising three kids by herself. She denies suicidal thoughts or ideation.   Tobacco: she has smoked since age 52 yo, but she wants to quit. She has tried weaning off without success, also took otc nicotine supplementation. Discussed Chantix but because of history of depression we will try Wellbutrin first and see if it helps.  Patient Active Problem List   Diagnosis Date Noted  . Migraine with aura and without status migrainosus 11/29/2016  . Chronic daily headache 11/29/2016  . Neck pain 11/29/2016  . Scoliosis of thoracolumbar spine 11/29/2016     History reviewed. No pertinent surgical history.  Family History  Problem Relation Age of Onset  . Asthma Mother   . Hypertension Mother   . Hyperlipidemia Mother   . Thyroid disease Mother   . Heart attack Father   . Migraines Father   . Hypertension Father   . Hyperlipidemia Father   . Mental retardation Brother   . COPD Brother   . Hypertension Brother   . Emphysema Brother   . Breast cancer Maternal Grandmother     Social History   Social History  . Marital status: Single    Spouse name: N/A  . Number of children: 3  . 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 Smoking status: Current Every Day Smoker    Packs/day: 0.50    Years: 17.00    Types: Cigarettes    Start date: 11/30/1999  . Smokeless tobacco: Never Used  . Alcohol use Yes     Comment: occasionally  . Drug use: No  . Sexual activity: Not Currently   Other Topics Concern  . Not on file   Social History Narrative   Single mother of 3 children, lives across from her parents     Current Outpatient Prescriptions:  .  zonisamide (  ZONEGRAN) 25 MG capsule, Take 4 capsules by mouth daily., Disp: , Rfl: 0  Allergies  Allergen Reactions  . Prednisone Hives and Other (See Comments)    Numbness of her face      ROS  Constitutional: Negative for fever or weight change.  Respiratory: Negative for cough and shortness of breath.   Cardiovascular: Negative for chest pain or palpitations.  Gastrointestinal: Negative for abdominal pain, no bowel changes.  Musculoskeletal: Negative for gait problem or joint swelling.  Skin: Negative for rash.  Neurological: Negative for dizziness or headache.  No other specific complaints in a complete review of systems (except as listed in HPI above).  Objective  Vitals:   11/29/16 0939  BP: 110/64  Pulse: 80  Resp: 18  Temp: 98.2 F (36.8 C)  TempSrc: Oral  SpO2: 96%  Weight: 146 lb  6.4 oz (66.4 kg)  Height: 5\' 5"  (1.651 m)    Body mass index is 24.36 kg/m.  Physical Exam  Constitutional: Patient appears well-developed and well-nourished.No distress.  HEENT: head atraumatic, normocephalic, pupils equal and reactive to light,  neck supple, throat within normal limits Cardiovascular: Normal rate, regular rhythm and normal heart sounds.  No murmur heard. No BLE edema. Pulmonary/Chest: Effort normal and breath sounds normal. No respiratory distress. Abdominal: Soft.  There is no tenderness. Psychiatric: Patient has a normal mood and affect. behavior is normal. Judgment and thought content normal. Muscular Skeletal: no pain during palpation, normal rom, but has pain with lateral bending   PHQ2/9: Depression screen PHQ 2/9 11/29/2016  Decreased Interest 0  Down, Depressed, Hopeless 0  PHQ - 2 Score 0     Fall Risk: Fall Risk  11/29/2016  Falls in the past year? No    Functional Status Survey: Is the patient deaf or have difficulty hearing?: No Does the patient have difficulty seeing, even when wearing glasses/contacts?: No Does the patient have difficulty concentrating, remembering, or making decisions?: No Does the patient have difficulty walking or climbing stairs?: No Does the patient have difficulty dressing or bathing?: No Does the patient have difficulty doing errands alone such as visiting a doctor's office or shopping?: No    Assessment & Plan  1. Migraine with aura and without status migrainosus, not intractable  She will stay longer with headache center to see if symptoms goes away, migraine is better, but still has daily headaches and neck pain. We will obtain records from neurologist   2. Chronic daily headache  Avoid nsaid's  3. Neck pain  Discussed PT and chiropractor and she will think about it  4. Scoliosis of thoracolumbar spine, unspecified scoliosis type  Stable - diagnosed as a teenager  5. Tobacco abuse  - buPROPion  (WELLBUTRIN XL) 150 MG 24 hr tablet; Take 1 tablet (150 mg total) by mouth daily.  Dispense: 30 tablet; Refill: 1  6. Mild depression (HCC)  Discussed possible side effects, if no improvement we will try Cymbalta - buPROPion (WELLBUTRIN XL) 150 MG 24 hr tablet; Take 1 tablet (150 mg total) by mouth daily.  Dispense: 30 tablet; Refill: 1

## 2016-12-31 ENCOUNTER — Ambulatory Visit (INDEPENDENT_AMBULATORY_CARE_PROVIDER_SITE_OTHER): Payer: BLUE CROSS/BLUE SHIELD | Admitting: Family Medicine

## 2016-12-31 ENCOUNTER — Encounter: Payer: Self-pay | Admitting: Family Medicine

## 2016-12-31 DIAGNOSIS — J0100 Acute maxillary sinusitis, unspecified: Secondary | ICD-10-CM | POA: Insufficient documentation

## 2016-12-31 MED ORDER — AMOXICILLIN-POT CLAVULANATE 875-125 MG PO TABS: 1.0000 | ORAL_TABLET | Freq: Two times a day (BID) | ORAL | 0 refills | Status: DC

## 2016-12-31 NOTE — Progress Notes (Signed)
Name: Hannah Gregory   MRN: [id number]    DOB: November 07, 1980   Date:12/31/2016       Progress Note  Subjective  Chief Complaint  Chief Complaint  Patient presents with  . URI    Onset-Friday, cough with a yellow mucus, chest tightness, congestion,sinus pain/pressure, bilateral ear pain, sore throat and fatigue. Patient has taken Mucinex and Theraflu with no relief.    Sinusitis  This is a new problem. The current episode started in the past 7 days (4 days ago). There has been no fever. The pain is moderate. Associated symptoms include coughing, ear pain, shortness of breath (pain in chest with inspiration), sinus pressure and a sore throat. Pertinent negatives include no chills. (Initial symptoms included runny nose) Past treatments include oral decongestants (taken Mucinex and Loratadine).    Past Medical History:  Diagnosis Date  . Migraine   . Scoliosis     No past surgical history on file.  Family History  Problem Relation Age of Onset  . Asthma Mother   . Hypertension Mother   . Hyperlipidemia Mother   . Thyroid disease Mother   . Heart attack Father   . Migraines Father   . Hypertension Father   . Hyperlipidemia Father   . Mental retardation Brother   . COPD Brother   . Hypertension Brother   . Emphysema Brother   . Breast cancer Maternal Grandmother     Social History   Social History  . Marital status: Single    Spouse name: N/A  . Number of children: 3  . 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 Smoking status: Current Every Day Smoker    Packs/day: 0.50    Years: 17.00    Types: Cigarettes    Start date: 11/30/1999  . Smokeless tobacco: Never Used  . Alcohol use Yes     Comment: occasionally  . Drug use: No  . Sexual activity: Not Currently   Other Topics Concern  . Not on file   Social History Narrative   Single mother of 3 children, lives across from her parents      Current Outpatient Prescriptions:  .  buPROPion (WELLBUTRIN XL) 150 MG 24 hr tablet, Take 1 tablet (150 mg total) by mouth daily., Disp: 30 tablet, Rfl: 1 .  Topiramate ER 25 MG CP24, Take 1 capsule by mouth daily. Taper up 25 mg weekly until she reaches 100 mg daily., Disp: , Rfl:   Allergies  Allergen Reactions  . Prednisone Hives and Other (See Comments)    Numbness of her face      Review of Systems  Constitutional: Negative for chills.  HENT: Positive for ear pain, sinus pressure and sore throat.   Respiratory: Positive for cough and shortness of breath (pain in chest with inspiration).     Objective  Vitals:   12/31/16 1018  BP: 122/74  Pulse: (!) 108  Resp: 16  Temp: 98 F (36.7 C)  TempSrc: Oral  SpO2: 97%  Weight: 144 lb 12.8 oz (65.7 kg)  Height:  (1.651 m)    Physical Exam  Constitutional: She is well-developed, well-nourished, and in no distress.  HENT:  Head: Normocephalic and atraumatic.  Right Ear: Tympanic membrane and ear canal normal. No drainage or swelling.  Left Ear: Tympanic membrane and ear canal normal. No drainage or swelling.  Nose: Right sinus exhibits maxillary  sinus tenderness and frontal sinus tenderness. Left sinus exhibits maxillary sinus tenderness and frontal sinus tenderness.  Mouth/Throat: Posterior oropharyngeal erythema present.  Nasal turbinate hypertrophy and mucosal inflammation  Neck: Neck supple.  Cardiovascular: Normal rate, regular rhythm, S1 normal, S2 normal and normal heart sounds.   No murmur heard. Pulmonary/Chest: Effort normal and breath sounds normal. No respiratory distress. She has no wheezes. She has no rhonchi.  Nursing note and vitals reviewed.    Assessment & Plan  1. Acute non-recurrent maxillary sinusitis By history and exam, consistent with sinusitis. Will start on antibiotic therapy for treatment - amoxicillin-clavulanate (AUGMENTIN) 875-125 MG tablet; Take 1 tablet by mouth 2 (two) times  daily.  Dispense: 20 tablet; Refill: 0   Eleyna Brugh [facility] 12/31/2016 10:31 AM

## 2017-01-04 ENCOUNTER — Telehealth: Payer: Self-pay | Admitting: Family Medicine

## 2017-01-04 DIAGNOSIS — J0100 Acute maxillary sinusitis, unspecified: Secondary | ICD-10-CM

## 2017-01-04 MED ORDER — AZITHROMYCIN 250 MG PO TABS: ORAL_TABLET | ORAL | 0 refills | Status: DC

## 2017-01-04 NOTE — Telephone Encounter (Signed)
Pt was seen by Dr Sherryll Burger on Monday and was prescribed Augmentin. She is not feeling any better. Still have severe sinus pressure, she is coughing up mucus, blowing her nose, taking mucinex and is still taking her antibotic. Feels as if nothing is helping. The Augmentin is causing her to have diarrhea and she is eating with it. Please advise [phone]

## 2017-01-04 NOTE — Telephone Encounter (Signed)
Patient is still experiencing pressure over the sinuses, headaches, and nasal congestion. She has been taking Augmentin for 4 days and have not noticed any improvement, now having diarrhea. We will DC Augmentin and start on azithromycin 250 mg 2 tablets on day 1 and 1 tablet every day for next 4 days. Patient advised to follow-up on Monday, April 9 if her symptoms do not improve and she verbalizes agreement.

## 2017-01-10 ENCOUNTER — Encounter: Payer: Self-pay | Admitting: Family Medicine

## 2017-01-10 ENCOUNTER — Ambulatory Visit (INDEPENDENT_AMBULATORY_CARE_PROVIDER_SITE_OTHER): Payer: BLUE CROSS/BLUE SHIELD | Admitting: Family Medicine

## 2017-01-10 VITALS — BP 112/68 | HR 96 | Temp 98.2°F | Resp 16 | Ht 65.0 in | Wt 146.4 lb

## 2017-01-10 DIAGNOSIS — G43109 Migraine with aura, not intractable, without status migrainosus: Secondary | ICD-10-CM | POA: Diagnosis not present

## 2017-01-10 DIAGNOSIS — R05 Cough: Secondary | ICD-10-CM | POA: Diagnosis not present

## 2017-01-10 DIAGNOSIS — J069 Acute upper respiratory infection, unspecified: Secondary | ICD-10-CM | POA: Diagnosis not present

## 2017-01-10 DIAGNOSIS — Z716 Tobacco abuse counseling: Secondary | ICD-10-CM

## 2017-01-10 DIAGNOSIS — F32A Depression, unspecified: Secondary | ICD-10-CM

## 2017-01-10 DIAGNOSIS — F320 Major depressive disorder, single episode, mild: Secondary | ICD-10-CM | POA: Diagnosis not present

## 2017-01-10 DIAGNOSIS — R059 Cough, unspecified: Secondary | ICD-10-CM

## 2017-01-10 MED ORDER — VARENICLINE TARTRATE 1 MG PO TABS: 1.0000 mg | ORAL_TABLET | Freq: Two times a day (BID) | ORAL | 1 refills | Status: DC

## 2017-01-10 MED ORDER — VARENICLINE TARTRATE 0.5 MG PO TABS: 0.5000 mg | ORAL_TABLET | Freq: Two times a day (BID) | ORAL | 0 refills | Status: DC

## 2017-01-10 MED ORDER — UMECLIDINIUM-VILANTEROL 62.5-25 MCG/INH IN AEPB: 1.0000 | INHALATION_SPRAY | Freq: Every day | RESPIRATORY_TRACT | 0 refills | Status: DC

## 2017-01-10 NOTE — Progress Notes (Signed)
Name: Hannah Gregory   MRN: [id number]    DOB: 01/16/1981   Date:01/10/2017       Progress Note  Subjective  Chief Complaint  Chief Complaint  Patient presents with  . Follow-up  . Nicotine Dependence    Patient was put on Wellbutrin last visit and not helping symptoms. Still smoking just as much and her taste buds have changed, makes her food taste bland  . URI    Still having a yellow mucus coming up and wanted to discuss since Dr. Sherryll Burger gave her a Z pak on 12/31/16 and finished but still having some symptoms    HPI  URI: seen by Dr. Sherryll Burger last week and diagnosed with sinusitis, she was given Augmentin and switched to Z-pack , she states she is feeling better, no longer having SOB, but still has nasal congestion and a productive cough. She has bene taking Mucinex, still smoking , but ready to quit. She is allergic to steroids. We will try inhaler.   Tobacco cessation: wellbutrin did not work for her, still has depression, but not suicidal, ready to quit smoking, willing to try Chantix  Migraine/daily headaches: seeing headache center, on Tomapax ER now, still has daily headaches but not as intense, and migraines less frequent now, at most once a week. Still has some tingling and also change in taste sensation, but willing to continue medication  Mild depression: she states she feels sad, cries at night, feels overwhelmed raising three kids by herself. She denies suicidal thoughts or ideation. She states lack of money is really bothering her and it is a motivation to quit smoking.   Patient Active Problem List   Diagnosis Date Noted  . Migraine with aura and without status migrainosus 11/29/2016  . Chronic daily headache 11/29/2016  . Neck pain 11/29/2016  . Scoliosis of thoracolumbar spine 11/29/2016  . Mild depression (HCC) 11/29/2016  . History of anxiety 11/29/2016    No past surgical history on file.  Family History  Problem Relation Age of Onset  . Asthma Mother   .  Hypertension Mother   . Hyperlipidemia Mother   . Thyroid disease Mother   . Heart attack Father   . Migraines Father   . Hypertension Father   . Hyperlipidemia Father   . Mental retardation Brother   . COPD Brother   . Hypertension Brother   . Emphysema Brother   . Breast cancer Maternal Grandmother     Social History   Social History  . Marital status: Single    Spouse name: N/A  . Number of children: 3  . 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 Smoking status: Current Every Day Smoker    Packs/day: 0.50    Years: 17.00    Types: Cigarettes    Start date: 11/30/1999  . Smokeless tobacco: Never Used  . Alcohol use Yes     Comment: occasionally  . Drug use: No  . Sexual activity: Not Currently   Other Topics Concern  . Not on file   Social History Narrative   Single mother of 3 children, lives across from her parents     Current Outpatient Prescriptions:  .  buPROPion (WELLBUTRIN XL) 150 MG 24 hr tablet, Take 1 tablet (150 mg total) by mouth daily., Disp: 30 tablet, Rfl: 1 .  Topiramate ER 25 MG CP24, Take 1 capsule by mouth  daily. Taper up 25 mg weekly until she reaches 100 mg daily., Disp: , Rfl:   Allergies  Allergen Reactions  . Prednisone Hives and Other (See Comments)    Numbness of her face      ROS  Constitutional: Negative for fever or weight change.  Respiratory: Positive  for cough no longer has  shortness of breath.   Cardiovascular: Negative for chest pain or palpitations.  Gastrointestinal: Negative for abdominal pain, no bowel changes.  Musculoskeletal: Negative for gait problem or joint swelling.  Skin: Negative for rash.  Neurological: Negative for dizziness , positive for headache.  No other specific complaints in a complete review of systems (except as listed in HPI above).  Objective  Vitals:   01/10/17 0934  BP: 112/68  Pulse: 96  Resp: 16  Temp: 98.2  F (36.8 C)  TempSrc: Oral  SpO2: 98%  Weight: 146 lb 6.4 oz (66.4 kg)  Height:  (1.651 m)    Body mass index is 24.36 kg/m.  Physical Exam  Constitutional: Patient appears well-developed and well-nourished.  No distress.  HEENT: head atraumatic, normocephalic, pupils equal and reactive to light,  neck supple, throat within normal limits Cardiovascular: Normal rate, regular rhythm and normal heart sounds.  No murmur heard. No BLE edema. Pulmonary/Chest: Effort normal and breath sounds normal. No respiratory distress. Abdominal: Soft.  There is no tenderness. Psychiatric: Patient has a normal mood and affect. behavior is normal. Judgment and thought content normal.  PHQ2/9: Depression screen PHQ 2/9 11/29/2016  Decreased Interest 0  Down, Depressed, Hopeless 0  PHQ - 2 Score 0     Fall Risk: Fall Risk  11/29/2016  Falls in the past year? No     Assessment & Plan  1. Mild depression (HCC)  She will stop Wellbutrin, not working, she wants to try to stop smoking at this time  2. Upper respiratory tract infection, unspecified type  - umeclidinium-vilanterol (ANORO ELLIPTA) 62.5-25 MCG/INH AEPB; Inhale 1 puff into the lungs daily.  Dispense: 60 each; Refill: 0 She is allergic to prednisone and we will avoid Breo, continue otc medication, finished Z-pack  3. Cough  - umeclidinium-vilanterol (ANORO ELLIPTA) 62.5-25 MCG/INH AEPB; Inhale 1 puff into the lungs daily.  Dispense: 60 each; Refill: 0  4. Migraine with aura and without status migrainosus, not intractable  Seeing neurologist and is now on Topamax and headaches not as severe  5. Encounter for tobacco use cessation counseling  Discussed possible side effects, she will try it - varenicline (CHANTIX CONTINUING MONTH PAK) 1 MG tablet; Take 1 tablet (1 mg total) by mouth 2 (two) times daily.  Dispense: 60 tablet; Refill: 1 - varenicline (CHANTIX) 0.5 MG tablet; Take 1 tablet (0.5 mg total) by mouth 2 (two) times  daily. 0.5 ( 11 pills ) and 42 pills of 1 mg pills Starter pack  Dispense: 53 tablet; Refill: 0

## 2017-04-18 ENCOUNTER — Ambulatory Visit: Payer: BLUE CROSS/BLUE SHIELD | Admitting: Family Medicine

## 2017-05-19 ENCOUNTER — Emergency Department
Admission: EM | Admit: 2017-05-19 | Discharge: 2017-05-19 | Disposition: A | Discharge status: Home / Self Care | Payer: Self-pay | Attending: Emergency Medicine | Admitting: Emergency Medicine

## 2017-05-19 ENCOUNTER — Encounter: Payer: Self-pay | Admitting: Emergency Medicine

## 2017-05-19 ENCOUNTER — Emergency Department: Payer: Self-pay

## 2017-05-19 DIAGNOSIS — F17210 Nicotine dependence, cigarettes, uncomplicated: Secondary | ICD-10-CM | POA: Insufficient documentation

## 2017-05-19 DIAGNOSIS — N23 Unspecified renal colic: Secondary | ICD-10-CM | POA: Insufficient documentation

## 2017-05-19 DIAGNOSIS — N201 Calculus of ureter: Secondary | ICD-10-CM | POA: Insufficient documentation

## 2017-05-19 DIAGNOSIS — N390 Urinary tract infection, site not specified: Secondary | ICD-10-CM | POA: Insufficient documentation

## 2017-05-19 DIAGNOSIS — Z79899 Other long term (current) drug therapy: Secondary | ICD-10-CM | POA: Insufficient documentation

## 2017-05-19 LAB — URINALYSIS, COMPLETE (UACMP) WITH MICROSCOPIC
Bacteria, UA: NONE SEEN
Bilirubin Urine: NEGATIVE
Glucose, UA: NEGATIVE mg/dL
Ketones, ur: NEGATIVE mg/dL
Nitrite: NEGATIVE
Protein, ur: NEGATIVE mg/dL
Specific Gravity, Urine: 1.015 (ref 1.005–1.030)
pH: 7 (ref 5.0–8.0)

## 2017-05-19 LAB — CBC
HCT: 40.9 % (ref 35.0–47.0)
Hemoglobin: 14.2 g/dL (ref 12.0–16.0)
MCH: 32.4 pg (ref 26.0–34.0)
MCHC: 34.8 g/dL (ref 32.0–36.0)
MCV: 92.9 fL (ref 80.0–100.0)
Platelets: 301 10*3/uL (ref 150–440)
RBC: 4.4 MIL/uL (ref 3.80–5.20)
RDW: 12.6 % (ref 11.5–14.5)
WBC: 11.8 10*3/uL — ABNORMAL HIGH (ref 3.6–11.0)

## 2017-05-19 LAB — COMPREHENSIVE METABOLIC PANEL
ALT: 11 U/L — ABNORMAL LOW (ref 14–54)
AST: 20 U/L (ref 15–41)
Albumin: 4.4 g/dL (ref 3.5–5.0)
Alkaline Phosphatase: 88 U/L (ref 38–126)
Anion gap: 7 (ref 5–15)
BUN: 15 mg/dL (ref 6–20)
CO2: 21 mmol/L — ABNORMAL LOW (ref 22–32)
Calcium: 9.3 mg/dL (ref 8.9–10.3)
Chloride: 111 mmol/L (ref 101–111)
Creatinine, Ser: 1.2 mg/dL — ABNORMAL HIGH (ref 0.44–1.00)
GFR calc Af Amer: >60 mL/min (ref >60)
GFR calc non Af Amer: 58 mL/min — ABNORMAL LOW (ref >60)
Glucose, Bld: 96 mg/dL (ref 65–99)
Potassium: 4 mmol/L (ref 3.5–5.1)
Sodium: 139 mmol/L (ref 135–145)
Total Bilirubin: 1.5 mg/dL — ABNORMAL HIGH (ref 0.3–1.2)
Total Protein: 7.8 g/dL (ref 6.5–8.1)

## 2017-05-19 LAB — LIPASE, BLOOD: Lipase: 28 U/L (ref 11–51)

## 2017-05-19 LAB — POCT PREGNANCY, URINE: Preg Test, Ur: NEGATIVE

## 2017-05-19 IMAGING — CT CT RENAL STONE PROTOCOL
2 of 4 series · 16 of 46 positions shown, 18 images · non-contrast
Comparison: [DATE]

CLINICAL DATA: Pt c/o left flank pain x 3 days with hematuria and
dysuria. No Known Hx of WA SENG. Neg. Preg. test today.

EXAM:
CT ABDOMEN AND PELVIS WITHOUT CONTRAST
TECHNIQUE: Multidetector CT imaging of the abdomen and pelvis was performed
following the standard protocol without IV contrast.

[Series 2: stone full standard · axial · 0.76mm/px · z∈[-991,-591]mm · 13 of 88 slices shown, 15 images]
[im 4/88  soft-tissue]
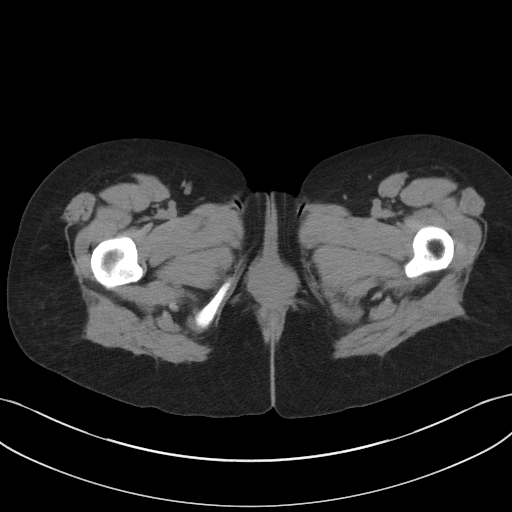
[im 4/88  bone]
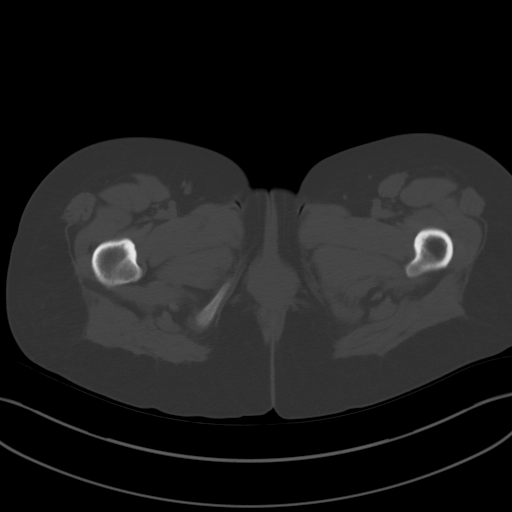
[im 11/88  soft-tissue]
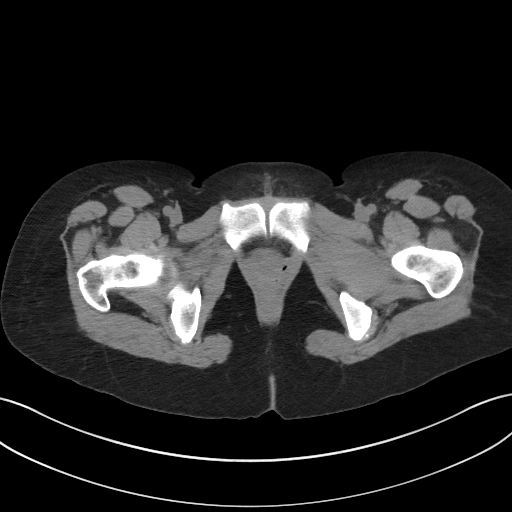
[im 19/88  soft-tissue]
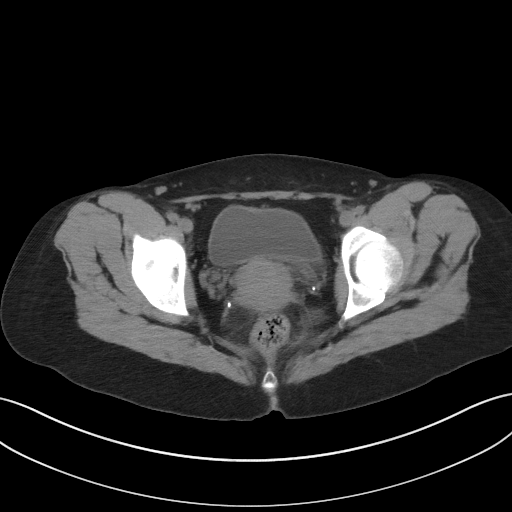
[im 26/88  soft-tissue]
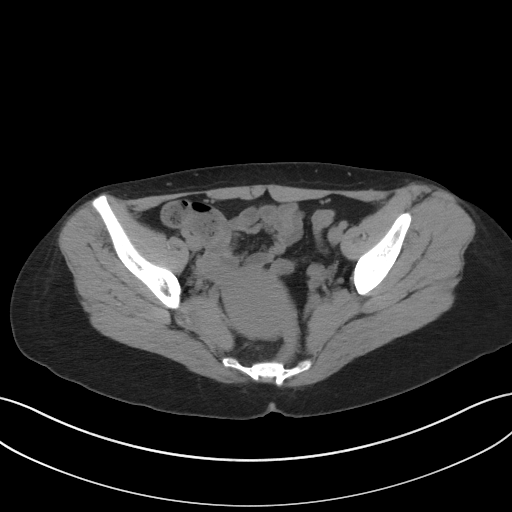
[im 30/88  soft-tissue]
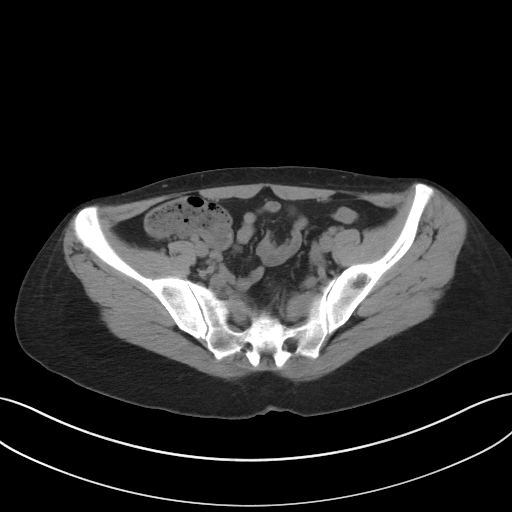
[im 37/88  soft-tissue]
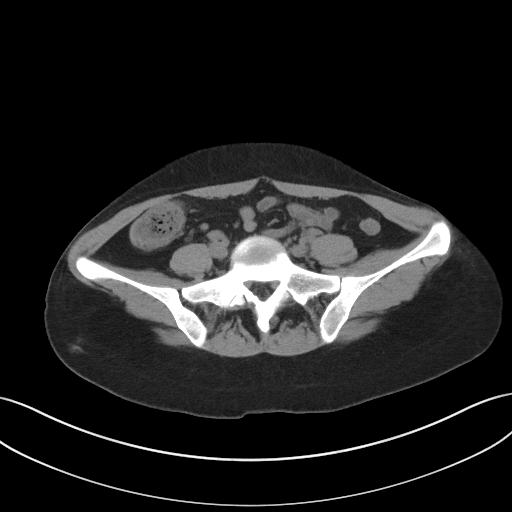
[im 44/88  soft-tissue]
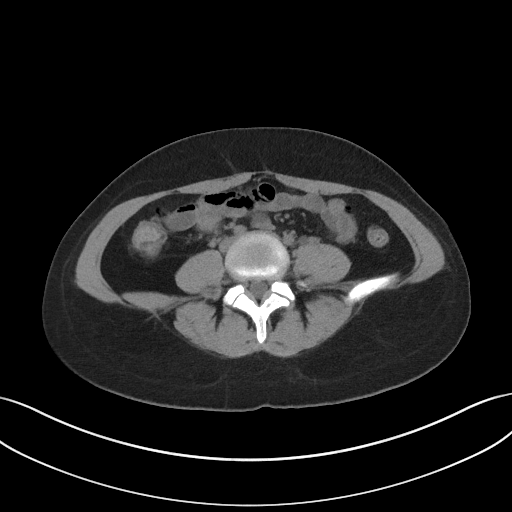
[im 51/88  soft-tissue]
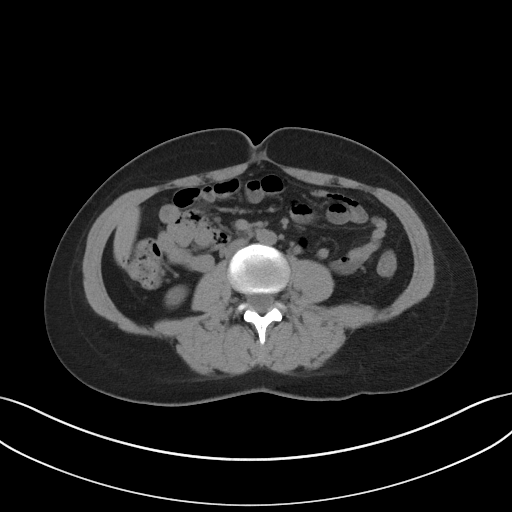
[im 59/88  soft-tissue]
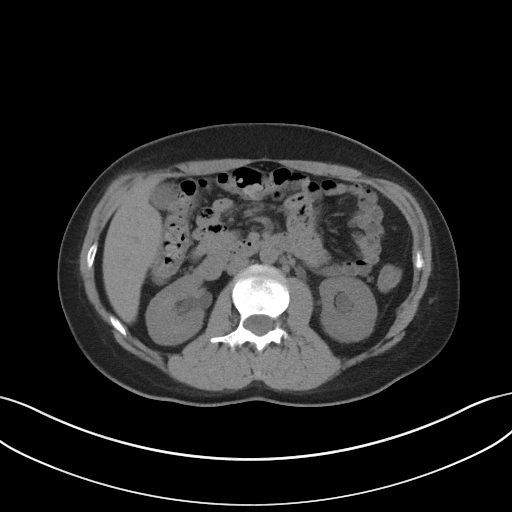
[im 59/88  bone]
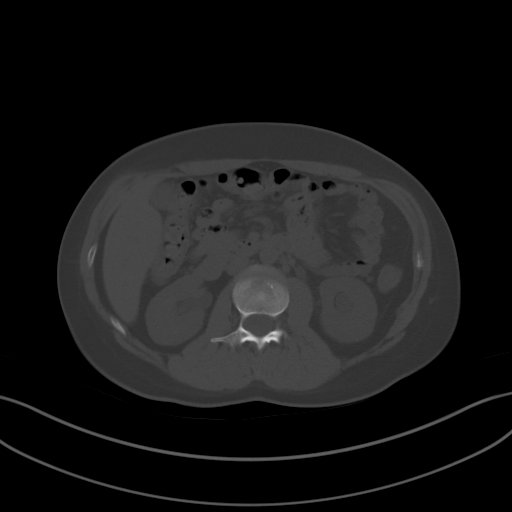
[im 62/88  soft-tissue]
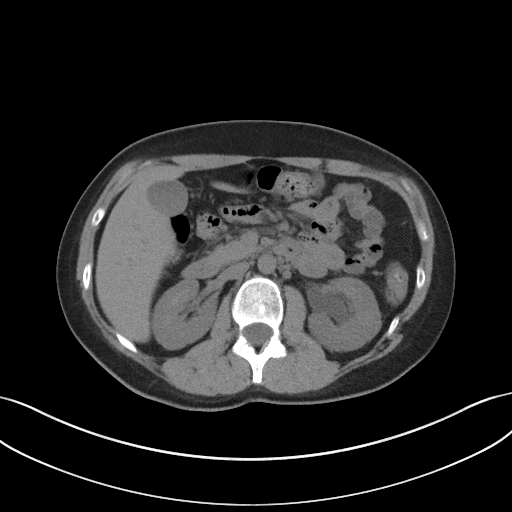
[im 69/88  soft-tissue]
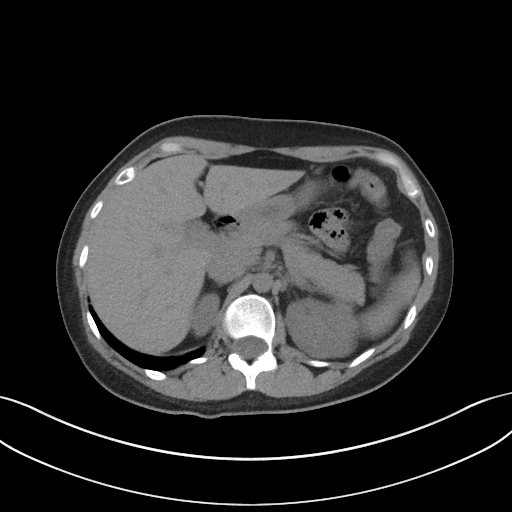
[im 77/88  soft-tissue]
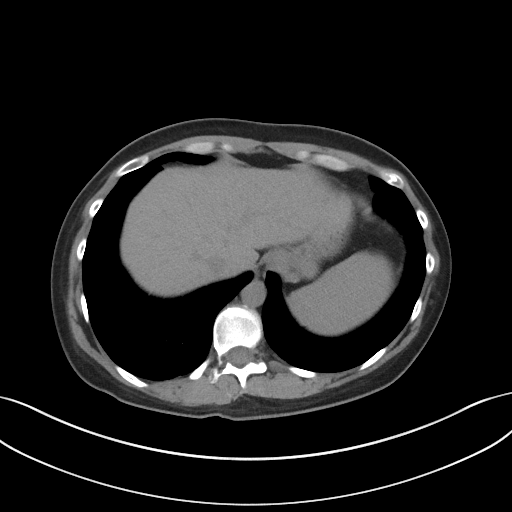
[im 84/88  soft-tissue]
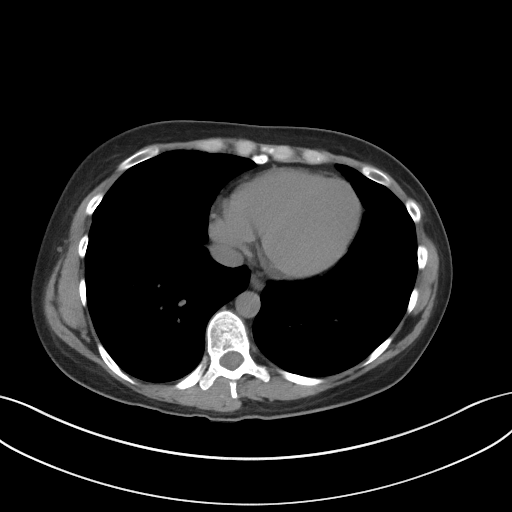

[Series 5: coronal · coronal · 0.69mm/px · 3 of 148 slices shown]
[im 50/148  soft-tissue]
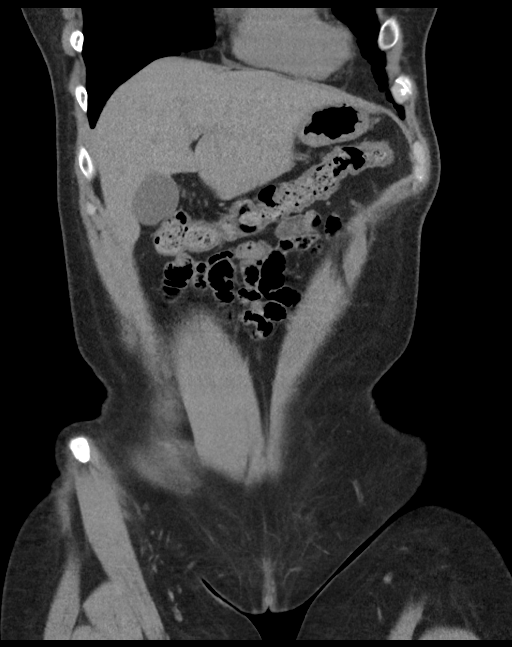
[im 66/148  soft-tissue]
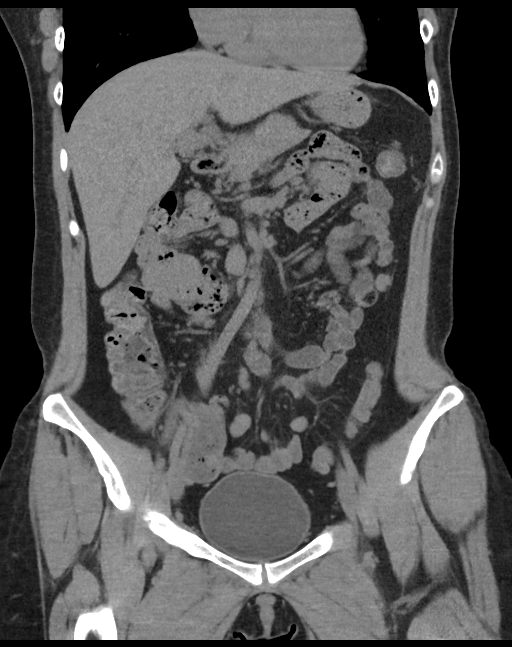
[im 82/148  soft-tissue]
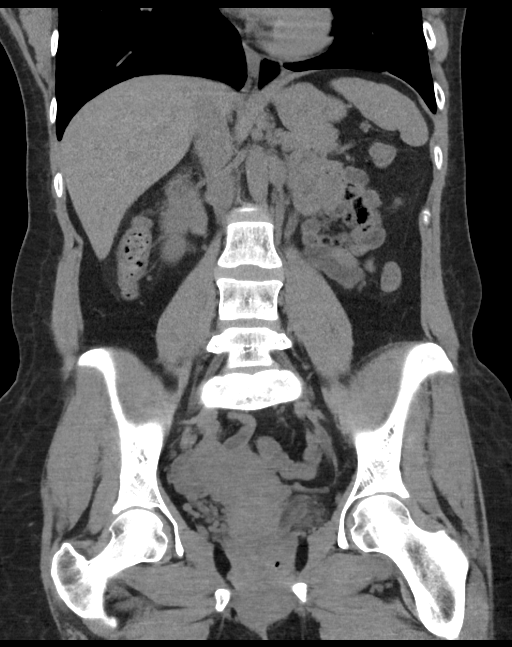

[16 of 46 positions shown; findings below may reference images not displayed]

FINDINGS: Lower chest: Clear lung bases.  Heart normal size.

Hepatobiliary: No focal liver abnormality is seen. No gallstones,
gallbladder wall thickening, or biliary dilatation.

Pancreas: Unremarkable. No pancreatic ductal dilatation or
surrounding inflammatory changes.

Spleen: Normal in size without focal abnormality.

Adrenals/Urinary Tract: No adrenal masses.

Mild to moderate left hydronephrosis with mild dilation of the left
ureter. This is due to a 5 mm stone at the left ureterovesicular
junction. No right hydronephrosis. Normal right ureter. No renal
masses. No intrarenal stones. Bladder is unremarkable.

Stomach/Bowel: Stomach is within normal limits. Appendix appears
normal. No evidence of bowel wall thickening, distention, or
inflammatory changes.

Vascular/Lymphatic: Aortic atherosclerosis. No enlarged abdominal or
pelvic lymph nodes.

Reproductive: Uterus and bilateral adnexa are unremarkable.

Other: No abdominal wall hernia or abnormality. No abdominopelvic
ascites.

Musculoskeletal: No acute or significant osseous findings.
IMPRESSION: 1. 5 mm stone at the left ureterovesicular junction causes mild to
moderate left hydronephrosis.
2. No other abnormalities.

## 2017-05-19 MED ORDER — ONDANSETRON 4 MG PO TBDP
8.0000 mg | ORAL_TABLET | Freq: Once | ORAL | Status: AC
  Administered 2017-05-19: 8 mg via ORAL
  Filled 2017-05-19: qty 2

## 2017-05-19 MED ORDER — SULFAMETHOXAZOLE-TRIMETHOPRIM 800-160 MG PO TABS: 1.0000 | ORAL_TABLET | Freq: Two times a day (BID) | ORAL | 0 refills | Status: DC

## 2017-05-19 MED ORDER — ONDANSETRON 4 MG PO TBDP: 4.0000 mg | ORAL_TABLET | Freq: Three times a day (TID) | ORAL | 0 refills | Status: DC | PRN

## 2017-05-19 MED ORDER — KETOROLAC TROMETHAMINE 60 MG/2ML IM SOLN
15.0000 mg | Freq: Once | INTRAMUSCULAR | Status: AC
  Administered 2017-05-19: 15 mg via INTRAMUSCULAR
  Filled 2017-05-19: qty 2

## 2017-05-19 MED ORDER — NAPROXEN 500 MG PO TABS: 500.0000 mg | ORAL_TABLET | Freq: Two times a day (BID) | ORAL | 0 refills | Status: DC

## 2017-05-19 MED ORDER — PHENAZOPYRIDINE HCL 200 MG PO TABS: 200.0000 mg | ORAL_TABLET | Freq: Three times a day (TID) | ORAL | 0 refills | Status: DC | PRN

## 2017-05-19 MED ORDER — TAMSULOSIN HCL 0.4 MG PO CAPS: 0.4000 mg | ORAL_CAPSULE | Freq: Every day | ORAL | 0 refills | Status: DC

## 2017-05-19 NOTE — ED Provider Notes (Signed)
[facility] Emergency Department Provider Note  ____________________________________________  Time seen: Approximately 12:20 PM  I have reviewed the triage vital signs and the nursing notes.   HISTORY  Chief Complaint Abdominal Pain and Back Pain    HPI Hannah Gregory is a 36 y.o. female who complains of left flank pain radiating to the left lower quadrant for the past 2 days, constant but colicky waxing and waning, severe worse. Moderate intensity at present. Aching. Also associated with urinary frequency and burning pain with urination. Denies fever chills or sweats. She has nausea and has decreased her oral intake to avoid painful urination, no vomiting and is tolerating oral intake. No dizziness.     Past Medical History:  Diagnosis Date  . Migraine   . Scoliosis      Patient Active Problem List   Diagnosis Date Noted  . Migraine with aura and without status migrainosus 11/29/2016  . Chronic daily headache 11/29/2016  . Neck pain 11/29/2016  . Scoliosis of thoracolumbar spine 11/29/2016  . Mild depression (HCC) 11/29/2016  . History of anxiety 11/29/2016     No past surgical history on file. Therapeutic abortion 05/07/2017  Prior to Admission medications   Medication Sig Start Date End Date Taking? Authorizing Provider  buPROPion (WELLBUTRIN XL) 150 MG 24 hr tablet Take 1 tablet (150 mg total) by mouth daily. 11/29/16   Alba Cory, MD  naproxen (NAPROSYN) 500 MG tablet Take 1 tablet (500 mg total) by mouth 2 (two) times daily with a meal. 05/19/17   Sharman Cheek, MD  ondansetron (ZOFRAN ODT) 4 MG disintegrating tablet Take 1 tablet (4 mg total) by mouth every 8 (eight) hours as needed for nausea or vomiting. 05/19/17   Sharman Cheek, MD  phenazopyridine (PYRIDIUM) 200 MG tablet Take 1 tablet (200 mg total) by mouth 3 (three) times daily as needed for pain. 05/19/17   Sharman Cheek, MD  sulfamethoxazole-trimethoprim (BACTRIM DS)  800-160 MG tablet Take 1 tablet by mouth 2 (two) times daily. 05/19/17   Sharman Cheek, MD  tamsulosin (FLOMAX) 0.4 MG CAPS capsule Take 1 capsule (0.4 mg total) by mouth daily. Until symptoms resolve 05/19/17   Sharman Cheek, MD  Topiramate ER 25 MG CP24 Take 1 capsule by mouth daily. Taper up 25 mg weekly until she reaches 100 mg daily.    [provider]  umeclidinium-vilanterol (ANORO ELLIPTA) 62.5-25 MCG/INH AEPB Inhale 1 puff into the lungs daily. 01/10/17   Alba Cory, MD  varenicline (CHANTIX CONTINUING MONTH PAK) 1 MG tablet Take 1 tablet (1 mg total) by mouth 2 (two) times daily. 01/10/17   Alba Cory, MD  varenicline (CHANTIX) 0.5 MG tablet Take 1 tablet (0.5 mg total) by mouth 2 (two) times daily. 0.5 ( 11 pills ) and 42 pills of 1 mg pills Starter pack 01/10/17   Alba Cory, MD     Allergies Prednisone   Family History  Problem Relation Age of Onset  . Asthma Mother   . Hypertension Mother   . Hyperlipidemia Mother   . Thyroid disease Mother   . Heart attack Father   . Migraines Father   . Hypertension Father   . Hyperlipidemia Father   . Mental retardation Brother   . COPD Brother   . Hypertension Brother   . Emphysema Brother   . Breast cancer Maternal Grandmother     Social History Social History  Substance Use Topics  . Smoking status: Current Every Day Smoker    Packs/day:  0.50    Years: 17.00    Types: Cigarettes    Start date: 11/30/1999  . Smokeless tobacco: Never Used  . Alcohol use Yes     Comment: occasionally    Review of Systems  Constitutional:   No fever or chills.  ENT:   No sore throat. No rhinorrhea. Cardiovascular:   No chest pain or syncope. Respiratory:   No dyspnea or cough. Gastrointestinal:   Positive flank pain as above without vomiting and diarrhea.  Musculoskeletal:   Negative for focal pain or swelling All other systems reviewed and are negative except as documented above in ROS and  HPI.  ____________________________________________   PHYSICAL EXAM:  VITAL SIGNS: ED Triage Vitals  Enc Vitals Group     BP 05/19/17 1030 131/72     Pulse Rate 05/19/17 1030 70     Resp --      Temp 05/19/17 1030 97.7 F (36.5 C)     Temp Source 05/19/17 1030 Oral     SpO2 05/19/17 1030 100 %     Weight 05/19/17 1030 146 lb (66.2 kg)     Height 05/19/17 1030 5\' 6"  (1.676 m)     Head Circumference --      Peak Flow --      Pain Score 05/19/17 1031 10     Pain Loc --      Pain Edu? --      Excl. in GC? --     Vital signs reviewed, nursing assessments reviewed.   Constitutional:   Alert and oriented. Well appearing and in no distress. Eyes:   No scleral icterus.  EOMI. No nystagmus. No conjunctival pallor. PERRL. ENT   Head:   Normocephalic and atraumatic.   Nose:   No congestion/rhinnorhea.    Mouth/Throat:   MMM, no pharyngeal erythema. No peritonsillar mass.    Neck:   No meningismus. Full ROM Hematological/Lymphatic/Immunilogical:   No cervical lymphadenopathy. Cardiovascular:   RRR. Symmetric bilateral radial and DP pulses.  No murmurs.  Respiratory:   Normal respiratory effort without tachypnea/retractions. Breath sounds are clear and equal bilaterally. No wheezes/rales/rhonchi. Gastrointestinal:   Soft with mild left lower quadrant tenderness. Non distended. There is no CVA tenderness.  No rebound, rigidity, or guarding. Genitourinary:   deferred Musculoskeletal:   Normal range of motion in all extremities. No joint effusions.  No lower extremity tenderness.  No edema. Neurologic:   Normal speech and language.  Motor grossly intact. No gross focal neurologic deficits are appreciated.  Skin:    Skin is warm, dry and intact. No rash noted.  No petechiae, purpura, or bullae.  ____________________________________________    LABS (pertinent positives/negatives) (all labs ordered are listed, but only abnormal results are displayed) Labs Reviewed   COMPREHENSIVE METABOLIC PANEL - Abnormal; Notable for the following:       Result Value   CO2 21 (*)    Creatinine, Ser 1.20 (*)    ALT 11 (*)    Total Bilirubin 1.5 (*)    GFR calc non Af Amer 58 (*)    All other components within normal limits  CBC - Abnormal; Notable for the following:    WBC 11.8 (*)    All other components within normal limits  URINALYSIS, COMPLETE (UACMP) WITH MICROSCOPIC - Abnormal; Notable for the following:    Color, Urine YELLOW (*)    APPearance CLEAR (*)    Hgb urine dipstick LARGE (*)    Leukocytes, UA TRACE (*)  Squamous Epithelial / LPF 0-5 (*)    All other components within normal limits  URINE CULTURE  LIPASE, BLOOD  POC URINE PREG, ED  POCT PREGNANCY, URINE   ____________________________________________   EKG    ____________________________________________    RADIOLOGY  Ct Renal Stone Study  Result Date: 05/19/2017 CLINICAL DATA:  Pt c/o left flank pain x 3 days with hematuria and dysuria. No Known Hx of KS. Neg. Preg. test today. EXAM: CT ABDOMEN AND PELVIS WITHOUT CONTRAST TECHNIQUE: Multidetector CT imaging of the abdomen and pelvis was performed following the standard protocol without IV contrast. COMPARISON:  07/19/2007 FINDINGS: Lower chest: Clear lung bases.  Heart normal size. Hepatobiliary: No focal liver abnormality is seen. No gallstones, gallbladder wall thickening, or biliary dilatation. Pancreas: Unremarkable. No pancreatic ductal dilatation or surrounding inflammatory changes. Spleen: Normal in size without focal abnormality. Adrenals/Urinary Tract: No adrenal masses. Mild to moderate left hydronephrosis with mild dilation of the left ureter. This is due to a 5 mm stone at the left ureterovesicular junction. No right hydronephrosis. Normal right ureter. No renal masses. No intrarenal stones. Bladder is unremarkable. Stomach/Bowel: Stomach is within normal limits. Appendix appears normal. No evidence of bowel wall thickening,  distention, or inflammatory changes. Vascular/Lymphatic: Aortic atherosclerosis. No enlarged abdominal or pelvic lymph nodes. Reproductive: Uterus and bilateral adnexa are unremarkable. Other: No abdominal wall hernia or abnormality. No abdominopelvic ascites. Musculoskeletal: No acute or significant osseous findings. IMPRESSION: 1. 5 mm stone at the left ureterovesicular junction causes mild to moderate left hydronephrosis. 2. No other abnormalities. Electronically Signed   By: Amie Portland M.D.   On: 05/19/2017 12:05    ____________________________________________   PROCEDURES Procedures  ____________________________________________   INITIAL IMPRESSION / ASSESSMENT AND PLAN / ED COURSE  Pertinent labs & imaging results that were available during my care of the patient were reviewed by me and considered in my medical decision making (see chart for details).  Patient presents with left flank pain radiating to left lower quadrant, concerning for renal colic. Urinalysis is not consistent with infection, but does reveal large amount of red blood cells. CT of the abdomen does show a 5 mm stone at the left UVJ, consistent with all of these symptoms including the colicky pain and dysuria and frequency due to bladder irritation. There is no high-grade obstruction, no evidence of pyelonephritis. Creatinine is slightly decreased from normal, which I attribute to dehydration, low suspicion for acute postobstructive renal failure. Urine culture sent, and medically with NSAIDs Zofran Flomax pyridium. I think concomitant infection is unlikely in this case, but to ensure there is no worsening of her symptoms prior to resolution I'll treat her with 1 week of bactrim as well. Return precautions provided. Considering the patient's symptoms, medical history, and physical examination today, I have low suspicion for cholecystitis or biliary pathology, pancreatitis, perforation or bowel obstruction, hernia,  intra-abdominal abscess, AAA or dissection, volvulus or intussusception, mesenteric ischemia, or appendicitis. Low suspicion for STI PID TOA or torsion.        ____________________________________________   FINAL CLINICAL IMPRESSION(S) / ED DIAGNOSES  Final diagnoses:  Lower urinary tract infectious disease  Renal colic on left side  Ureterolithiasis      New Prescriptions   NAPROXEN (NAPROSYN) 500 MG TABLET    Take 1 tablet (500 mg total) by mouth 2 (two) times daily with a meal.   ONDANSETRON (ZOFRAN ODT) 4 MG DISINTEGRATING TABLET    Take 1 tablet (4 mg total) by mouth every 8 (eight)  hours as needed for nausea or vomiting.   PHENAZOPYRIDINE (PYRIDIUM) 200 MG TABLET    Take 1 tablet (200 mg total) by mouth 3 (three) times daily as needed for pain.   SULFAMETHOXAZOLE-TRIMETHOPRIM (BACTRIM DS) 800-160 MG TABLET    Take 1 tablet by mouth 2 (two) times daily.   TAMSULOSIN (FLOMAX) 0.4 MG CAPS CAPSULE    Take 1 capsule (0.4 mg total) by mouth daily. Until symptoms resolve     Portions of this note were generated with dragon dictation software. Dictation errors may occur despite best attempts at proofreading.    Sharman Cheek, MD 05/19/17 1225

## 2017-05-19 NOTE — ED Notes (Signed)
Pt tells ED tech that she had abortion on 8/7 but did not want to say in front of family.

## 2017-05-19 NOTE — Discharge Instructions (Signed)
The CT scan shows a 5 mm stone just outside the bladder on your left side. This appears to be the source of your symptoms. It is not causing a high-grade obstruction. Take Bactrim as prescribed to treat any urinary tract infection, and taking other medications as prescribed to control these symptoms until the stone passes. Once the stone moves into your bladder your symptoms will dramatically improve. Drink lots of water to maintain good hydration. Return to the ER if you have fever or vomiting or severe uncontrollable pain.

## 2017-05-19 NOTE — ED Triage Notes (Signed)
Pt reporting lower left sided back pain, lower abdominal pain, urinary frequency and burning. Pt alert and oriented X4, active, cooperative, pt in NAD. RR even and unlabored, color WNL.

## 2017-05-21 LAB — URINE CULTURE: Culture: 20000 — AB

## 2017-12-11 ENCOUNTER — Telehealth: Payer: Self-pay | Admitting: Family Medicine

## 2017-12-11 NOTE — Telephone Encounter (Signed)
Pt states she has been directly exposed to the flu by a co-worker who was dx this AM.  Pt is asymptomatic. Questioning if [facility] could be called in for her as preventative med. Pt has had the flu shot this season.  If approved: [facility] - [place], [place] - [address]

## 2017-12-11 NOTE — Telephone Encounter (Unsigned)
Copied from CRM #[id number]. Topic: Inquiry °>> Dec 11, 2017  1:07 PM Hayes, Teresa G wrote: °Pt has been exposed to the flu - pt has had a flu shot.  Needing to know if there is anything she needs to do to prevent the flu, like tamiflu. ° °Please call pt back asap °

## 2017-12-11 NOTE — Telephone Encounter (Unsigned)
Copied from CRM [id number]. Topic: Inquiry >> Dec 11, 2017  1:07 PM Raquel SarnaHayes, Teresa G wrote: Pt has been exposed to the flu - pt has had a flu shot.  Needing to know if there is anything she needs to do to prevent the flu, like tamiflu.  Please call pt back asap

## 2017-12-12 NOTE — Telephone Encounter (Signed)
I am sorry, not seen in almost one year.

## 2018-01-17 ENCOUNTER — Emergency Department: Payer: Managed Care, Other (non HMO)

## 2018-01-17 ENCOUNTER — Emergency Department
Admission: EM | Admit: 2018-01-17 | Discharge: 2018-01-17 | Disposition: A | Discharge status: Home / Self Care | Payer: Managed Care, Other (non HMO) | Attending: Emergency Medicine | Admitting: Emergency Medicine

## 2018-01-17 ENCOUNTER — Ambulatory Visit: Payer: Self-pay | Admitting: *Deleted

## 2018-01-17 ENCOUNTER — Other Ambulatory Visit: Payer: Self-pay

## 2018-01-17 ENCOUNTER — Encounter: Payer: Self-pay | Admitting: Emergency Medicine

## 2018-01-17 DIAGNOSIS — J0141 Acute recurrent pansinusitis: Secondary | ICD-10-CM | POA: Diagnosis not present

## 2018-01-17 DIAGNOSIS — F17210 Nicotine dependence, cigarettes, uncomplicated: Secondary | ICD-10-CM | POA: Insufficient documentation

## 2018-01-17 DIAGNOSIS — Z79899 Other long term (current) drug therapy: Secondary | ICD-10-CM | POA: Diagnosis not present

## 2018-01-17 DIAGNOSIS — F17200 Nicotine dependence, unspecified, uncomplicated: Secondary | ICD-10-CM

## 2018-01-17 DIAGNOSIS — J3489 Other specified disorders of nose and nasal sinuses: Secondary | ICD-10-CM | POA: Insufficient documentation

## 2018-01-17 DIAGNOSIS — R0981 Nasal congestion: Secondary | ICD-10-CM | POA: Diagnosis not present

## 2018-01-17 DIAGNOSIS — H6690 Otitis media, unspecified, unspecified ear: Secondary | ICD-10-CM | POA: Insufficient documentation

## 2018-01-17 DIAGNOSIS — J209 Acute bronchitis, unspecified: Secondary | ICD-10-CM | POA: Diagnosis not present

## 2018-01-17 DIAGNOSIS — R05 Cough: Secondary | ICD-10-CM | POA: Diagnosis present

## 2018-01-17 IMAGING — CR DG CHEST 2V
1 series · 2 of 2 positions shown · non-contrast
Comparison: Chest x-ray dated [DATE].

CLINICAL DATA: Cough and shortness of breath.

EXAM:
CHEST - 2 VIEW

[Series 1: dg chest 2 view · 0.14mm/px · 2 of 2 slices shown]
[im 1/2]
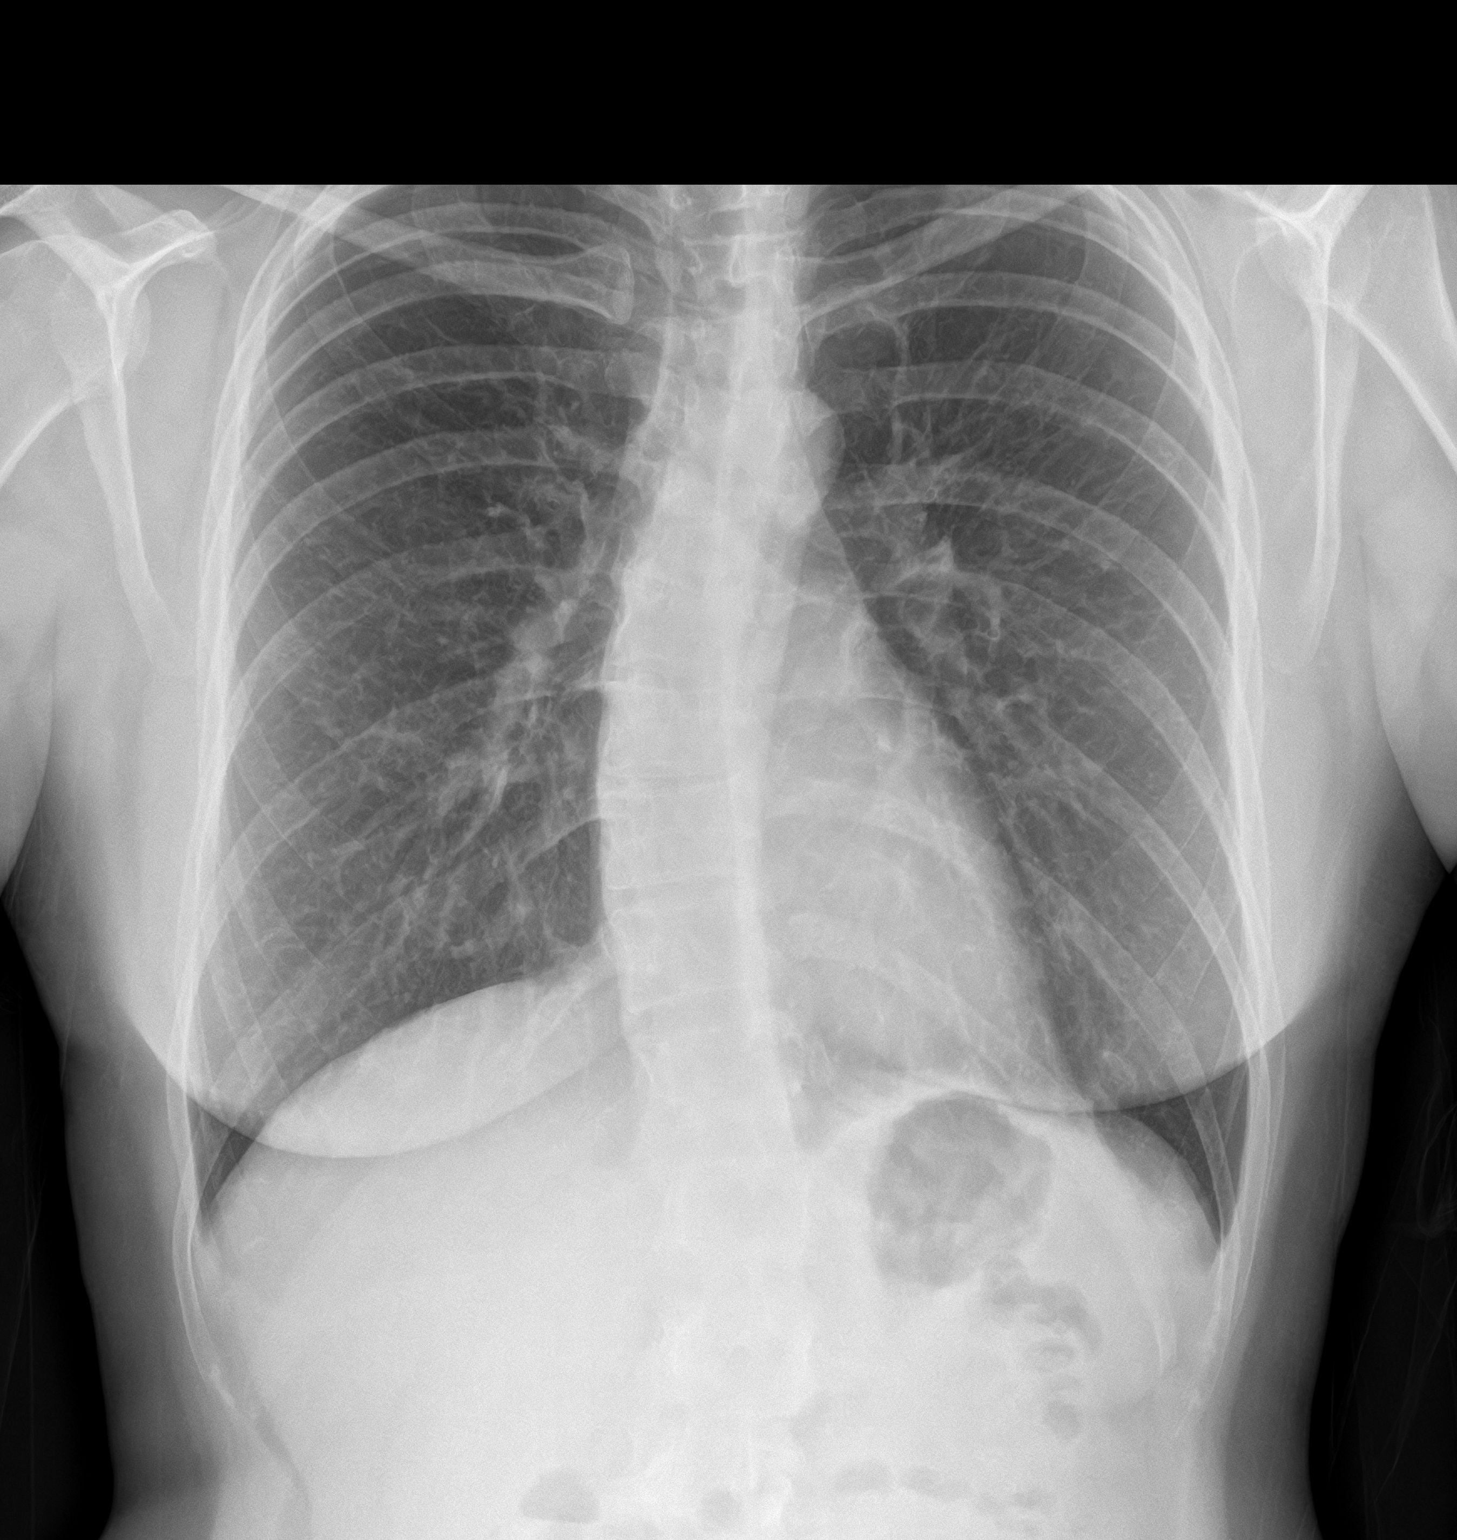
[im 2/2]
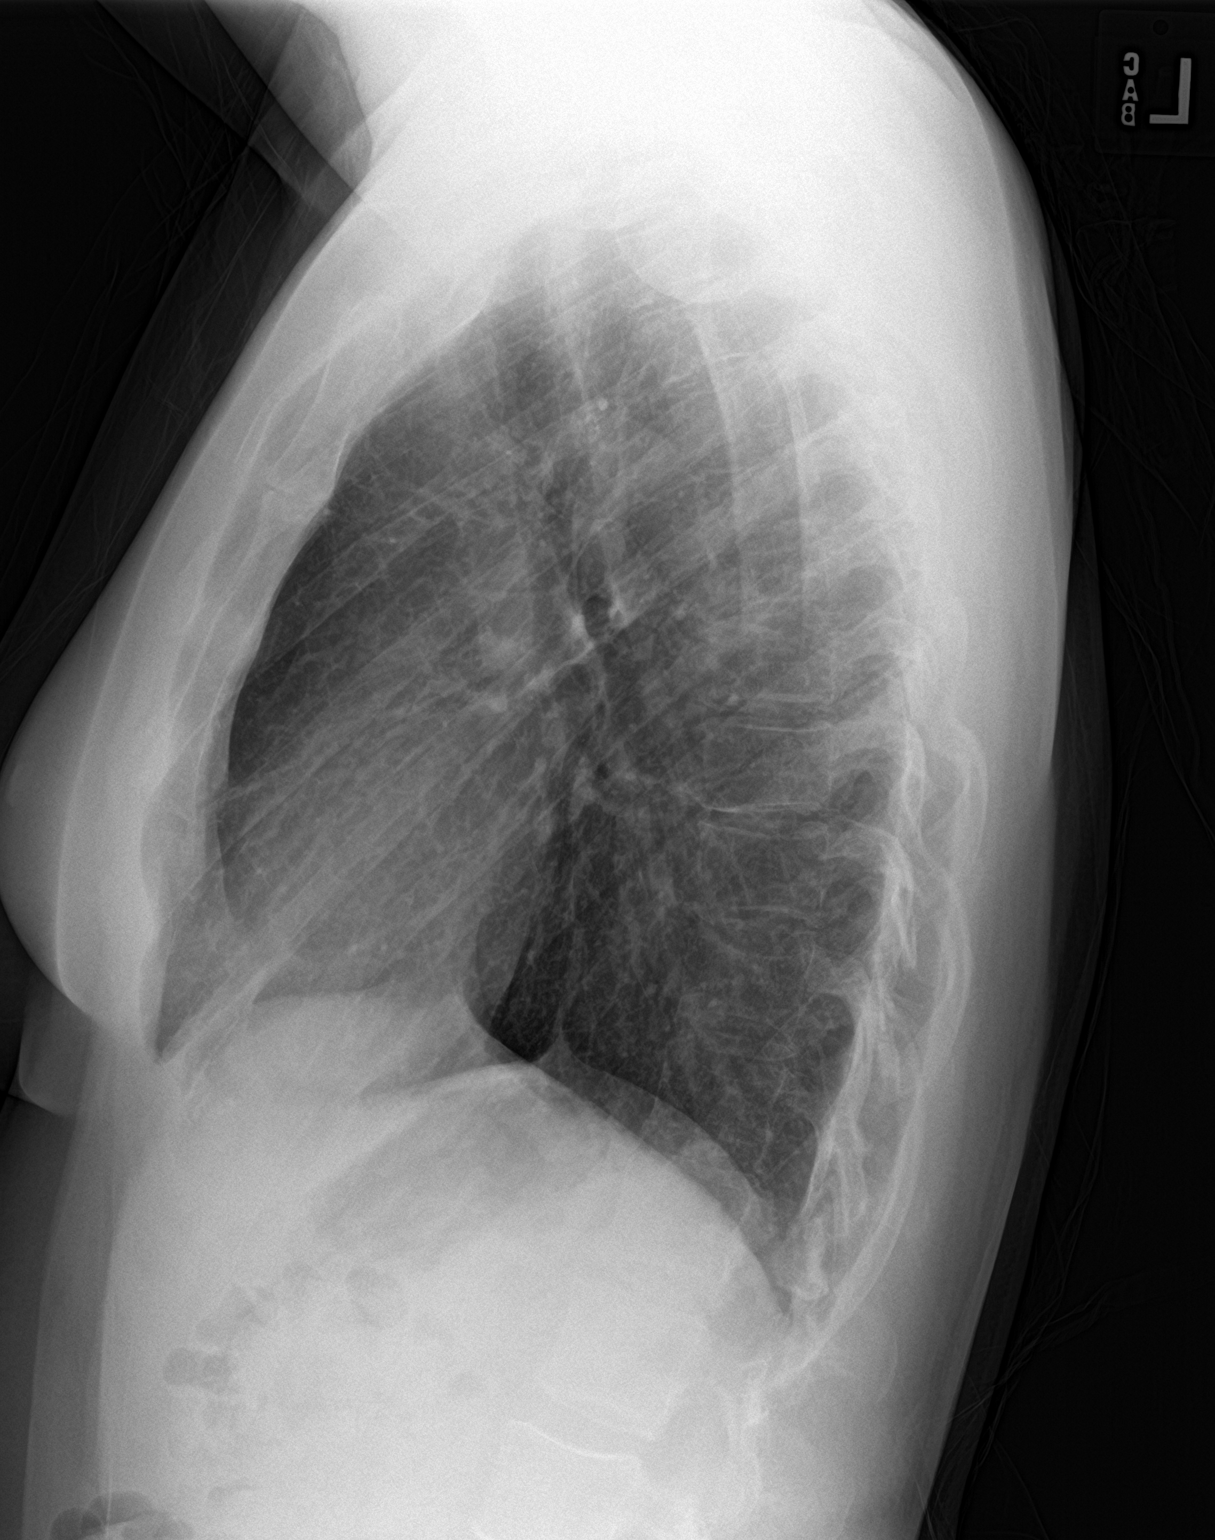

[2 of 2 positions shown; findings below may reference images not displayed]

FINDINGS: The heart size and mediastinal contours are within normal limits.
Normal pulmonary vascularity. No focal consolidation, pleural
effusion, or pneumothorax. No acute osseous abnormality. Unchanged
mild thoracic spine dextroscoliosis.
IMPRESSION: No active cardiopulmonary disease.

## 2018-01-17 MED ORDER — IPRATROPIUM-ALBUTEROL 0.5-2.5 (3) MG/3ML IN SOLN
3.0000 mL | Freq: Once | RESPIRATORY_TRACT | Status: AC
  Administered 2018-01-17: 3 mL via RESPIRATORY_TRACT
  Filled 2018-01-17: qty 3

## 2018-01-17 MED ORDER — PSEUDOEPH-BROMPHEN-DM 30-2-10 MG/5ML PO SYRP: 5.0000 mL | ORAL_SOLUTION | Freq: Four times a day (QID) | ORAL | 0 refills | Status: DC | PRN

## 2018-01-17 MED ORDER — LEVOFLOXACIN 500 MG PO TABS: 500.0000 mg | ORAL_TABLET | Freq: Every day | ORAL | 0 refills | Status: AC

## 2018-01-17 MED ORDER — ALBUTEROL SULFATE HFA 108 (90 BASE) MCG/ACT IN AERS: 2.0000 | INHALATION_SPRAY | Freq: Four times a day (QID) | RESPIRATORY_TRACT | 2 refills | Status: DC | PRN

## 2018-01-17 NOTE — ED Notes (Signed)
Patient appears congested and is tachypnic at this time.  Patient states, "I feel like I ran a marathon."

## 2018-01-17 NOTE — ED Provider Notes (Signed)
[facility] Emergency Department Provider Note   ____________________________________________   First MD Initiated Contact with Patient 01/17/18 1034     (approximate)  I have reviewed the triage vital signs and the nursing notes.   HISTORY  Chief Complaint Cough; Recurrent Sinusitis; and Otitis Media  HPI Hannah Gregory is a 37 y.o. female is here complaint of sinus infection and bilateral ear infection for over 2 weeks.  Patient states she initially started with Augmentin and took 4 days of the medication and felt that she was getting worse.  Her PCP then switched her to doxycycline which she was unable to take due to stomach upset.  Patient then went back to the Augmentin and finished it this morning.  She states she still continues to cough up phlem and have sinus pressure.  She has been taking Mucinex without any relief.  Currently she rates her pain as 7 out of 10.   Past Medical History:  Diagnosis Date  . Migraine   . Scoliosis     Patient Active Problem List   Diagnosis Date Noted  . Migraine with aura and without status migrainosus 11/29/2016  . Chronic daily headache 11/29/2016  . Neck pain 11/29/2016  . Scoliosis of thoracolumbar spine 11/29/2016  . Mild depression (HCC) 11/29/2016  . History of anxiety 11/29/2016    History reviewed. No pertinent surgical history.  Prior to Admission medications   Medication Sig Start Date End Date Taking? Authorizing Provider  albuterol (PROVENTIL HFA;VENTOLIN HFA) 108 (90 Base) MCG/ACT inhaler Inhale 2 puffs into the lungs every 6 (six) hours as needed for wheezing or shortness of breath. 01/17/18   Tommi Rumps, PA-C  brompheniramine-pseudoephedrine-DM 30-2-10 MG/5ML syrup Take 5 mLs by mouth 4 (four) times daily as needed. 01/17/18   Tommi Rumps, PA-C  buPROPion (WELLBUTRIN XL) 150 MG 24 hr tablet Take 1 tablet (150 mg total) by mouth daily. 11/29/16   Alba Cory, MD  levofloxacin  (LEVAQUIN) 500 MG tablet Take 1 tablet (500 mg total) by mouth daily for 10 days. 01/17/18 01/27/18  Tommi Rumps, PA-C  tamsulosin (FLOMAX) 0.4 MG CAPS capsule Take 1 capsule (0.4 mg total) by mouth daily. Until symptoms resolve 05/19/17   Sharman Cheek, MD  Topiramate ER 25 MG CP24 Take 1 capsule by mouth daily. Taper up 25 mg weekly until she reaches 100 mg daily.    [provider]  umeclidinium-vilanterol (ANORO ELLIPTA) 62.5-25 MCG/INH AEPB Inhale 1 puff into the lungs daily. 01/10/17   Alba Cory, MD  varenicline (CHANTIX CONTINUING MONTH PAK) 1 MG tablet Take 1 tablet (1 mg total) by mouth 2 (two) times daily. 01/10/17   Alba Cory, MD  varenicline (CHANTIX) 0.5 MG tablet Take 1 tablet (0.5 mg total) by mouth 2 (two) times daily. 0.5 ( 11 pills ) and 42 pills of 1 mg pills Starter pack 01/10/17   Alba Cory, MD    Allergies Prednisone  Family History  Problem Relation Age of Onset  . Asthma Mother   . Hypertension Mother   . Hyperlipidemia Mother   . Thyroid disease Mother   . Heart attack Father   . Migraines Father   . Hypertension Father   . Hyperlipidemia Father   . Mental retardation Brother   . COPD Brother   . Hypertension Brother   . Emphysema Brother   . Breast cancer Maternal Grandmother     Social History Social History   Tobacco Use  . Smoking  status: Current Every Day Smoker    Packs/day: 0.50    Years: 17.00    Pack years: 8.50    Types: Cigarettes    Start date: 11/30/1999  . Smokeless tobacco: Never Used  Substance Use Topics  . Alcohol use: Yes    Comment: occasionally  . Drug use: No    Review of Systems Constitutional: No fever/chills Eyes: No visual changes. ENT: Positive for sinus and ear pain. Cardiovascular: Denies chest pain. Respiratory: Denies shortness of breath.  Positive for productive cough. Gastrointestinal: No abdominal pain.  No nausea, no vomiting.  Musculoskeletal: Negative for back  pain. Neurological: Negative for headaches. ____________________________________________   PHYSICAL EXAM:  VITAL SIGNS: ED Triage Vitals  Enc Vitals Group     BP 01/17/18 0956 (!) 137/100     Pulse Rate 01/17/18 0956 69     Resp 01/17/18 0956 (!) 26     Temp 01/17/18 0956 98.1 F (36.7 C)     Temp Source 01/17/18 0956 Oral     SpO2 01/17/18 0956 100 %     Weight 01/17/18 1010 155 lb (70.3 kg)     Height 01/17/18 1010 5\' 6"  (1.676 m)     Head Circumference --      Peak Flow --      Pain Score 01/17/18 1010 7     Pain Loc --      Pain Edu? --      Excl. in GC? --    Constitutional: Alert and oriented. Well appearing and in no acute distress. Eyes: Conjunctivae are normal. PERRL. EOMI. Head: Atraumatic. Nose: Moderate congestion/rhinnorhea.  Bilateral TMs are dull with poor light reflex.  No erythema or injection noted. Mouth/Throat: Mucous membranes are moist.  Oropharynx non-erythematous.  Moderate posterior drainage noted. Neck: No stridor.   Hematological/Lymphatic/Immunilogical: No cervical lymphadenopathy. Cardiovascular: Normal rate, regular rhythm. Grossly normal heart sounds.  Good peripheral circulation. Respiratory: Normal respiratory effort.  No retractions. Lungs CTAB. Gastrointestinal: Soft and nontender. No distention.  Musculoskeletal: Moves upper and lower extremities without any difficulty.  Normal gait was noted. Neurologic:  Normal speech and language. No gross focal neurologic deficits are appreciated. Skin:  Skin is warm, dry and intact. No rash noted. Psychiatric: Mood and affect are normal. Speech and behavior are normal.  ____________________________________________   LABS (all labs ordered are listed, but only abnormal results are displayed)  Labs Reviewed - No data to display  RADIOLOGY  ED MD interpretation:   Chest x-ray was negative for pneumonia.  Official radiology report(s): Dg Chest 2 View  Result Date: 01/17/2018 CLINICAL DATA:   Cough and shortness of breath. EXAM: CHEST - 2 VIEW COMPARISON:  Chest x-ray dated April 13, 2011. FINDINGS: The heart size and mediastinal contours are within normal limits. Normal pulmonary vascularity. No focal consolidation, pleural effusion, or pneumothorax. No acute osseous abnormality. Unchanged mild thoracic spine dextroscoliosis. IMPRESSION: No active cardiopulmonary disease. Electronically Signed   By: Obie Dredge M.D.   On: 01/17/2018 10:36    ____________________________________________   PROCEDURES  Procedure(s) performed: None  Procedures  Critical Care performed: No  ____________________________________________   INITIAL IMPRESSION / ASSESSMENT AND PLAN / ED COURSE  As part of my medical decision making, I reviewed the following data within the electronic MEDICAL RECORD NUMBER Notes from prior ED visits and [place] Controlled Substance Database  Patient is here with complaint of persistent sinusitis and symptoms of bronchitis.  She has taken Augmentin infrequently, stopped and took doxycycline which she was  unable to tolerate and then went back to the Augmentin.  She states she can still continues to have symptoms and is unaware of any fever or chills.  Patient is allergic to prednisone.  Patient was discharged with a prescription for Bromfed-DM 4 times daily, albuterol inhaler, and Levaquin 500 mg 1 daily for 7 days.  Patient is encouraged to discontinue smoking and increase fluids.  She is to follow-up with her PCP if any continued problems. ____________________________________________   FINAL CLINICAL IMPRESSION(S) / ED DIAGNOSES  Final diagnoses:  Acute bronchitis, unspecified organism  Acute recurrent pansinusitis  Current every day smoker     ED Discharge Orders        Ordered    brompheniramine-pseudoephedrine-DM 30-2-10 MG/5ML syrup  4 times daily PRN     01/17/18 1228    albuterol (PROVENTIL HFA;VENTOLIN HFA) 108 (90 Base) MCG/ACT inhaler  Every 6 hours PRN      01/17/18 1228    levofloxacin (LEVAQUIN) 500 MG tablet  Daily     01/17/18 1228       Note:  This document was prepared using Dragon voice recognition software and may include unintentional dictation errors.    Tommi RumpsSummers, Londell Noll L, PA-C 01/17/18 1334    Darci CurrentBrown, Parch[place] N, MD 01/17/18 [id number]

## 2018-01-17 NOTE — ED Triage Notes (Signed)
Pt was diagnosed with a sinus infection and bilat ear infection last week, started augmentin, pt then started feeling like it was going into her chest and states that she is coughing a lot of sputum up, pt also states that they switched her to doxycycline but she was unable to keep it down, back to augmentin this morning was the last dose.

## 2018-01-17 NOTE — ED Notes (Signed)
Pt ambulatory upon discharge. Verbalized understanding of discharge instructions, follow-up care and prescriptions. VSS. Skin warm and dry. A&O x4.   Pt reports her breathing "feeling a lot easier."

## 2018-01-17 NOTE — Telephone Encounter (Signed)
Hannah Gregory phoned in feeling like she can't get a good breath in. "Stated she feels like she has been running". [facility] and dx for bilat ear infections and sinus infection, was given Augmentin. After no improvement, she phoned [facility] on Monday and started a new antibiotic, Doxycycline which she could not tolerate due to vomiting. She did go back to taking the Augmentin after not tolerating Doxycycline. Beginning yesterday, she feels it's getting harder to breath in she is also coughing up green-brownish chunks of phlegm. Stated she began wheezing yesterday. Denies any fever. During our conversation, I could actively hear her shortness of breath. Advised she go directly to [facility] at [facility]. Stated she would.     Reason for Disposition . [1] MODERATE difficulty breathing (e.g., speaks in phrases, SOB even at rest, pulse 100-120) AND [2] NEW-onset or WORSE than normal  Answer Assessment - Initial Assessment Questions 1. RESPIRATORY STATUS: "Describe your breathing?" (e.g., wheezing, shortness of breath, unable to speak, severe coughing)      Feels like she can't get a good deep breath, wheezing on intake and blowing out. Productive cough, greenish brown chunks going on 4-5 days.  2. ONSET: "When did this breathing problem begin?"     About one week 3. PATTERN "Does the difficult breathing come and go, or has it been constant since it started?"     Started yesterday and today not being able to walk without getting SOB 4. SEVERITY: "How bad is your breathing?" (e.g., mild, moderate, severe)    - MILD: No SOB at rest, mild SOB with walking, speaks normally in sentences, can lay down, no retractions, pulse < 100.    - MODERATE: SOB at rest, SOB with minimal exertion and prefers to sit, cannot lie down flat, speaks in phrases, mild retractions, audible wheezing, pulse 100-120.    - SEVERE: Very SOB at rest, speaks in single words, struggling to breathe, sitting hunched forward,  retractions, pulse > 120     Moderate 5. RECURRENT SYMPTOM: "Have you had difficulty breathing before?" If so, ask: "When was the last time?" and "What happened that time?"     no 6. CARDIAC HISTORY: "Do you have any history of heart disease?" (e.g., heart attack, angina, bypass surgery, angioplasty)     no 7. LUNG HISTORY: "Do you have any history of lung disease?"  (e.g., pulmonary embolus, asthma, emphysema)  no 8. CAUSE: "What do you think is causing the breathing problem?"     [facility] dx'd with double ear infection and s inus infection. 9. OTHER SYMPTOMS: "Do you have any other symptoms? (e.g., dizziness, runny nose, cough, chest pain, fever) Runny nose. Using mucinex and antihistamine OTC 10. PREGNANCY: "Is there any chance you are pregnant?" "When was your last menstrual period?" no 11. TRAVEL: "Have you traveled out of the country in the last month?" (e.g., travel history, exposures)      no  Protocols used: BREATHING DIFFICULTY-A-AH

## 2018-01-17 NOTE — Discharge Instructions (Addendum)
Aloe up with your primary care provider if any continued problems.  Begin using albuterol inhaler 4 times a day.ed10 Bromfed DM as needed for cough and congestion.  The antibiotic Levaquin is once a day for the next 7 days.  Increase fluids and decrease smoking.

## 2018-03-26 ENCOUNTER — Ambulatory Visit (INDEPENDENT_AMBULATORY_CARE_PROVIDER_SITE_OTHER): Payer: Managed Care, Other (non HMO) | Admitting: Family Medicine

## 2018-03-26 ENCOUNTER — Encounter: Payer: Self-pay | Admitting: Family Medicine

## 2018-03-26 VITALS — BP 116/74 | HR 76 | Temp 97.7°F | Resp 16 | Ht 65.35 in | Wt 151.5 lb

## 2018-03-26 DIAGNOSIS — Z131 Encounter for screening for diabetes mellitus: Secondary | ICD-10-CM

## 2018-03-26 DIAGNOSIS — R51 Headache: Secondary | ICD-10-CM | POA: Diagnosis not present

## 2018-03-26 DIAGNOSIS — Z01419 Encounter for gynecological examination (general) (routine) without abnormal findings: Secondary | ICD-10-CM

## 2018-03-26 DIAGNOSIS — Z720 Tobacco use: Secondary | ICD-10-CM

## 2018-03-26 DIAGNOSIS — Z79899 Other long term (current) drug therapy: Secondary | ICD-10-CM | POA: Diagnosis not present

## 2018-03-26 DIAGNOSIS — Z01411 Encounter for gynecological examination (general) (routine) with abnormal findings: Secondary | ICD-10-CM

## 2018-03-26 DIAGNOSIS — F320 Major depressive disorder, single episode, mild: Secondary | ICD-10-CM

## 2018-03-26 DIAGNOSIS — G43109 Migraine with aura, not intractable, without status migrainosus: Secondary | ICD-10-CM | POA: Diagnosis not present

## 2018-03-26 DIAGNOSIS — F32A Depression, unspecified: Secondary | ICD-10-CM

## 2018-03-26 DIAGNOSIS — R519 Headache, unspecified: Secondary | ICD-10-CM

## 2018-03-26 DIAGNOSIS — Z13220 Encounter for screening for lipoid disorders: Secondary | ICD-10-CM

## 2018-03-26 DIAGNOSIS — Z124 Encounter for screening for malignant neoplasm of cervix: Secondary | ICD-10-CM

## 2018-03-26 DIAGNOSIS — Z113 Encounter for screening for infections with a predominantly sexual mode of transmission: Secondary | ICD-10-CM

## 2018-03-26 MED ORDER — ERENUMAB-AOOE 140 MG/ML ~~LOC~~ SOAJ: 140.0000 mg | SUBCUTANEOUS | 5 refills | Status: DC

## 2018-03-26 MED ORDER — TOPIRAMATE 25 MG PO TABS: 25.0000 mg | ORAL_TABLET | Freq: Two times a day (BID) | ORAL | 0 refills | Status: DC

## 2018-03-26 MED ORDER — GALCANEZUMAB-GNLM 120 MG/ML ~~LOC~~ SOAJ: 120.0000 mg | SUBCUTANEOUS | 5 refills | Status: DC

## 2018-03-26 MED ORDER — VENLAFAXINE HCL ER 37.5 MG PO CP24: 37.5000 mg | ORAL_CAPSULE | Freq: Every day | ORAL | 0 refills | Status: DC

## 2018-03-26 NOTE — Progress Notes (Signed)
Name: Hannah Gregory   MRN: [id number]    DOB: 1981/05/11   Date:03/26/2018       Progress Note  Subjective  Chief Complaint  Chief Complaint  Patient presents with  . Annual Exam  . Medication Refill  . Migraine    Lost her insurance and was unable to go to [facility]-having migraines once weekly  . Depression    HPI   Patient presents for annual CPE and follow up   Tobacco cessation: wellbutrin did not work for her, also tried Chantix but caused nausea at higher dose. She has been unable to quit, discussed tobacco quit line   Migraine/daily headaches: seeing headache center, on Tomapax ER now, still has daily headaches but not as intense, and migraines less frequent now, at most once a week. Still has some tingling and also change in taste sensation, but willing to continue medication  Mild depression: she states she has not been feeling sad, but states she is short tempered, feels angry inside that is worse at work,  Denies recent crying spells.  She still feels overwhelmed raising three kids by herself and working full time. She denies suicidal thoughts or ideation. She tried Zoloft during pregnancy and also Citalopram many years ago, but states did not seem to help. We will try another medication and monitor   Migraine and daily headache: she has a long history of migraines. She was seeing headache center, took sustained released  Topamax and had trigger point injections and symptoms improved, but out of medication and having nuchal pain and dull headache. Migraine is described as intense on nuchal area, usually happens during the night, pain is described as pulsating, like her head will explode, associated with nausea, photophobia and phonophobia, back to once a week now.   Diet: eating fast food frequently  Exercise: not very active   USPSTF grade A and B recommendations  Depression:  Depression screen [facility] [place] 2/9 03/26/2018 11/29/2016  Decreased Interest 0 0  Down,  Depressed, Hopeless 2 0  PHQ - 2 Score 2 0  Altered sleeping 1 -  Tired, decreased energy 1 -  Change in appetite 0 -  Feeling bad or failure about yourself  1 -  Trouble concentrating 0 -  Moving slowly or fidgety/restless 0 -  Suicidal thoughts 0 -  PHQ-9 Score 5 -  Difficult doing work/chores Somewhat difficult -   Hypertension: BP Readings from Last 3 Encounters:  03/26/18 116/74  01/17/18 114/68  05/19/17 121/68   Obesity: Wt Readings from Last 3 Encounters:  03/26/18 151 lb 8 oz (68.7 kg)  01/17/18 155 lb (70.3 kg)  05/19/17 146 lb (66.2 kg)   BMI Readings from Last 3 Encounters:  03/26/18 24.94 kg/m  01/17/18 25.02 kg/m  05/19/17 23.57 kg/m     HIV: today  STD testing and prevention (chl/gon/syphilis): today  Intimate partner violence: no  Sexual History/Pain during Intercourse: no pain during intercourse  Menstrual History/LMP/Abnormal Bleeding: cycles are irregular because of Depo Incontinence Symptoms: no problems.   Advanced Care Planning: A voluntary discussion about advance care planning including the explanation and discussion of advance directives.  Discussed health care proxy and Living will, and the patient was able to identify a health care proxy as mother .  Patient does not have a living will at present time.   Breast cancer: N/A BRCA gene screening: both grandmothers with breast cancer and one maternal aunt.  Cervical cancer screening: done at health department and we will get  records today    Glucose:  Glucose  Date Value Ref Range Status  07/24/2014 94 65 - 99 mg/dL Final   Glucose, Bld  Date Value Ref Range Status  05/19/2017 96 65 - 99 mg/dL Final    Skin cancer: discussed importance of checking self for changing in skin lesions    Patient Active Problem List   Diagnosis Date Noted  . Migraine with aura and without status migrainosus 11/29/2016  . Chronic daily headache 11/29/2016  . Neck pain 11/29/2016  . Scoliosis of  thoracolumbar spine 11/29/2016  . Mild depression ([place]) 11/29/2016  . History of anxiety 11/29/2016    History reviewed. No pertinent surgical history.  Family History  Problem Relation Age of Onset  . Asthma Mother   . Hypertension Mother   . Hyperlipidemia Mother   . Thyroid disease Mother   . Heart attack Father   . Migraines Father   . Hypertension Father   . Hyperlipidemia Father   . Mental retardation Brother   . COPD Brother   . Hypertension Brother   . Emphysema Brother   . Breast cancer Maternal Grandmother     Social History   Socioeconomic History  . Marital status: Single    Spouse name: Not on file  . Number of children: 3  . Years of education: Not on file  . Highest education level: High school graduate  Occupational History  . Occupation: Occupational psychologist     Comment: [employer]  Social Needs  . Financial resource strain: Somewhat hard  . Food insecurity:    Worry: Never true    Inability: Never true  . Transportation needs:    Medical: No    Non-medical: No  Tobacco Use  . Smoking status: Current Every Day Smoker    Packs/day: 0.50    Years: 18.00    Pack years: 9.00    Types: Cigarettes    Start date: 11/30/1999  . Smokeless tobacco: Never Used  . Tobacco comment: Chantix-made her nausea and unable to tolerate  Substance and Sexual Activity  . Alcohol use: Yes    Comment: occasionally  . Drug use: No  . Sexual activity: Not Currently  Lifestyle  . Physical activity:    Days per week: 0 days    Minutes per session: 0 min  . Stress: To some extent  Relationships  . Social connections:    Talks on phone: More than three times a week    Gets together: Three times a week    Attends religious service: Never    Active member of club or organization: No    Attends meetings of clubs or organizations: Never    Relationship status: Never married  . Intimate partner violence:    Fear of current or ex partner: No    Emotionally  abused: No    Physically abused: No    Forced sexual activity: No  Other Topics Concern  . Not on file  Social History Narrative   Single mother of 3 children, lives across from her parents    No current outpatient medications on file.  Allergies  Allergen Reactions  . Chantix [Varenicline Tartrate]     nausea  . Prednisone Hives and Other (See Comments)    Numbness of her face      ROS   Constitutional: Negative for fever or weight change.  Respiratory: Negative for cough and shortness of breath.   Cardiovascular: Negative for chest pain or palpitations.  Gastrointestinal:  Negative for abdominal pain, no bowel changes.  Musculoskeletal: Negative for gait problem or joint swelling.  Skin: Negative for rash.  Neurological: Negative for dizziness or headache.  No other specific complaints in a complete review of systems (except as listed in HPI above).   Objective  Vitals:   03/26/18 0850  BP: 116/74  Pulse: 76  Resp: 16  Temp: 97.7 F (36.5 C)  TempSrc: Oral  SpO2: 98%  Weight: 151 lb 8 oz (68.7 kg)  Height: 5' 5.35" (1.66 m)    Body mass index is 24.94 kg/m.  Physical Exam  Constitutional: Patient appears well-developed and well-nourished. No distress.  HENT: Head: Normocephalic and atraumatic. Ears: B TMs ok, no erythema or effusion; Nose: Nose normal. Mouth/Throat: Oropharynx is clear and moist. No oropharyngeal exudate.  Eyes: Conjunctivae and EOM are normal. Pupils are equal, round, and reactive to light. No scleral icterus.  Neck: Normal range of motion. Neck supple. No JVD present. No thyromegaly present.  Cardiovascular: Normal rate, regular rhythm and normal heart sounds.  No murmur heard. No BLE edema. Pulmonary/Chest: Effort normal and breath sounds normal. No respiratory distress. Abdominal: Soft. Bowel sounds are normal, no distension. There is no tenderness. no masses Breast: no lumps or masses, no nipple discharge or rashes FEMALE GENITALIA:   External genitalia normal External urethra normal Vaginal vault normal without discharge or lesions Cervix normal without discharge or lesions Bimanual exam normal without masses RECTAL: not done Musculoskeletal: Normal range of motion, no joint effusions. No gross deformities Neurological: he is alert and oriented to person, place, and time. No cranial nerve deficit. Coordination, balance, strength, speech and gait are normal.  Skin: Skin is warm and dry. No rash noted. No erythema.  Psychiatric: Patient has a normal mood and affect. behavior is normal. Judgment and thought content normal.   PHQ2/9: Depression screen [facility] 2/9 03/26/2018 11/29/2016  Decreased Interest 0 0  Down, Depressed, Hopeless 2 0  PHQ - 2 Score 2 0  Altered sleeping 1 -  Tired, decreased energy 1 -  Change in appetite 0 -  Feeling bad or failure about yourself  1 -  Trouble concentrating 0 -  Moving slowly or fidgety/restless 0 -  Suicidal thoughts 0 -  PHQ-9 Score 5 -  Difficult doing work/chores Somewhat difficult -     Fall Risk: Fall Risk  03/26/2018 11/29/2016  Falls in the past year? No No    Functional Status Survey: Is the patient deaf or have difficulty hearing?: No Does the patient have difficulty seeing, even when wearing glasses/contacts?: No(glasses) Does the patient have difficulty concentrating, remembering, or making decisions?: No Does the patient have difficulty walking or climbing stairs?: No Does the patient have difficulty dressing or bathing?: No Does the patient have difficulty doing errands alone such as visiting a doctor's office or shopping?: No   Assessment & Plan  1. Well woman exam  Discussed importance of 150 minutes of physical activity weekly, eat two servings of fish weekly, eat one serving of tree nuts ( cashews, pistachios, pecans, almonds.Marland Kitchen) every other day, eat 6 servings of fruit/vegetables daily and drink plenty of water and avoid sweet beverages.   2. Diabetes  mellitus screening  - Hemoglobin A1c  3. Lipid screening  - Lipid panel  4. Encounter for long-term (current) use of medications  - CBC with Differential/Platelet - COMPLETE METABOLIC PANEL WITH GFR  5. Cervical cancer screening  - Pap IG, CT/NG NAA, and HPV (high risk)  6. Routine  screening for STI (sexually transmitted infection)  - HIV antibody - RPR- - Acute hepatitis panel   7. Migraine with aura and without status migrainosus, not intractable  - topiramate (TOPAMAX) 25 MG tablet; Take 1-4 tablets (25-100 mg total) by mouth 2 (two) times daily.  Dispense: 120 tablet; Refill: 0  - Erenumab-aooe (AIMOVIG) 140 MG/ML SOAJ; Inject 140 mg into the skin every 30 (thirty) days.  Dispense: 1 mL; Refill: 5   8. Tobacco abuse  Discussed ways to quit   9. Chronic daily headache  Used to go to headache center but lost to follow up because of gap of insurance and new insurance has high deductible.   10. Mild depression (HCC)  - venlafaxine XR (EFFEXOR-XR) 37.5 MG 24 hr capsule; Take 1-2 capsules (37.5-75 mg total) by mouth daily with breakfast. 37.5 first week after that 2 daily  Dispense: 60 capsule; Refill: 0

## 2018-03-26 NOTE — Patient Instructions (Signed)
Preventive Care 18-39 Years, Female Preventive care refers to lifestyle choices and visits with your health care provider that can promote health and wellness. What does preventive care include?  A yearly physical exam. This is also called an annual well check.  Dental exams once or twice a year.  Routine eye exams. Ask your health care provider how often you should have your eyes checked.  Personal lifestyle choices, including: ? Daily care of your teeth and gums. ? Regular physical activity. ? Eating a healthy diet. ? Avoiding tobacco and drug use. ? Limiting alcohol use. ? Practicing safe sex. ? Taking vitamin and mineral supplements as recommended by your health care provider. What happens during an annual well check? The services and screenings done by your health care provider during your annual well check will depend on your age, overall health, lifestyle risk factors, and family history of disease. Counseling Your health care provider may ask you questions about your:  Alcohol use.  Tobacco use.  Drug use.  Emotional well-being.  Home and relationship well-being.  Sexual activity.  Eating habits.  Work and work Statistician.  Method of birth control.  Menstrual cycle.  Pregnancy history.  Screening You may have the following tests or measurements:  Height, weight, and BMI.  Diabetes screening. This is done by checking your blood sugar (glucose) after you have not eaten for a while (fasting).  Blood pressure.  Lipid and cholesterol levels. These may be checked every 5 years starting at age 66.  Skin check.  Hepatitis C blood test.  Hepatitis B blood test.  Sexually transmitted disease (STD) testing.  BRCA-related cancer screening. This may be done if you have a family history of breast, ovarian, tubal, or peritoneal cancers.  Pelvic exam and Pap test. This may be done every 3 years starting at age 40. Starting at age 59, this may be done every 5  years if you have a Pap test in combination with an HPV test.  Discuss your test results, treatment options, and if necessary, the need for more tests with your health care provider. Vaccines Your health care provider may recommend certain vaccines, such as:  Influenza vaccine. This is recommended every year.  Tetanus, diphtheria, and acellular pertussis (Tdap, Td) vaccine. You may need a Td booster every 10 years.  Varicella vaccine. You may need this if you have not been vaccinated.  HPV vaccine. If you are 69 or younger, you may need three doses over 6 months.  Measles, mumps, and rubella (MMR) vaccine. You may need at least one dose of MMR. You may also need a second dose.  Pneumococcal 13-valent conjugate (PCV13) vaccine. You may need this if you have certain conditions and were not previously vaccinated.  Pneumococcal polysaccharide (PPSV23) vaccine. You may need one or two doses if you smoke cigarettes or if you have certain conditions.  Meningococcal vaccine. One dose is recommended if you are age 27-21 years and a first-year college student living in a residence hall, or if you have one of several medical conditions. You may also need additional booster doses.  Hepatitis A vaccine. You may need this if you have certain conditions or if you travel or work in places where you may be exposed to hepatitis A.  Hepatitis B vaccine. You may need this if you have certain conditions or if you travel or work in places where you may be exposed to hepatitis B.  Haemophilus influenzae type b (Hib) vaccine. You may need this if  you have certain risk factors.  Talk to your health care provider about which screenings and vaccines you need and how often you need them. This information is not intended to replace advice given to you by your health care provider. Make sure you discuss any questions you have with your health care provider. Document Released: 11/13/2001 Document Revised: 06/06/2016  Document Reviewed: 07/19/2015 Elsevier Interactive Patient Education  [employer].

## 2018-03-27 LAB — HEPATITIS PANEL, ACUTE
Hep A IgM: NONREACTIVE
Hep B C IgM: NONREACTIVE
Hepatitis B Surface Ag: NONREACTIVE
Hepatitis C Ab: NONREACTIVE
SIGNAL TO CUT-OFF: 0.04 (ref <1.00)

## 2018-03-27 LAB — CBC WITH DIFFERENTIAL/PLATELET
Basophils Absolute: 52 cells/uL (ref 0–200)
Basophils Relative: 0.8 %
Eosinophils Absolute: 202 cells/uL (ref 15–500)
Eosinophils Relative: 3.1 %
HCT: 41.3 % (ref 35.0–45.0)
Hemoglobin: 14 g/dL (ref 11.7–15.5)
Lymphs Abs: 2100 cells/uL (ref 850–3900)
MCH: 32 pg (ref 27.0–33.0)
MCHC: 33.9 g/dL (ref 32.0–36.0)
MCV: 94.3 fL (ref 80.0–100.0)
MPV: 9.8 fL (ref 7.5–12.5)
Monocytes Relative: 6.6 %
Neutro Abs: 3718 cells/uL (ref 1500–7800)
Neutrophils Relative %: 57.2 %
Platelets: 323 10*3/uL (ref 140–400)
RBC: 4.38 10*6/uL (ref 3.80–5.10)
RDW: 11.7 % (ref 11.0–15.0)
Total Lymphocyte: 32.3 %
WBC mixed population: 429 cells/uL (ref 200–950)
WBC: 6.5 10*3/uL (ref 3.8–10.8)

## 2018-03-27 LAB — COMPLETE METABOLIC PANEL WITH GFR
AG Ratio: 1.5 (calc) (ref 1.0–2.5)
ALT: 20 U/L (ref 6–29)
AST: 16 U/L (ref 10–30)
Albumin: 4.4 g/dL (ref 3.6–5.1)
Alkaline phosphatase (APISO): 94 U/L (ref 33–115)
BUN: 14 mg/dL (ref 7–25)
CO2: 22 mmol/L (ref 20–32)
Calcium: 9.4 mg/dL (ref 8.6–10.2)
Chloride: 109 mmol/L (ref 98–110)
Creat: 0.98 mg/dL (ref 0.50–1.10)
GFR, Est African American: 87 mL/min/{1.73_m2} (ref >=60)
GFR, Est Non African American: 75 mL/min/{1.73_m2} (ref >=60)
Globulin: 2.9 g/dL (calc) (ref 1.9–3.7)
Glucose, Bld: 71 mg/dL (ref 65–139)
Potassium: 3.9 mmol/L (ref 3.5–5.3)
Sodium: 140 mmol/L (ref 135–146)
Total Bilirubin: 0.5 mg/dL (ref 0.2–1.2)
Total Protein: 7.3 g/dL (ref 6.1–8.1)

## 2018-03-27 LAB — LIPID PANEL
Cholesterol: 183 mg/dL (ref <200)
HDL: 39 mg/dL — ABNORMAL LOW (ref >50)
LDL Cholesterol (Calc): 127 mg/dL (calc) — ABNORMAL HIGH
Non-HDL Cholesterol (Calc): 144 mg/dL (calc) — ABNORMAL HIGH (ref <130)
Total CHOL/HDL Ratio: 4.7 (calc) (ref <5.0)
Triglycerides: 73 mg/dL (ref <150)

## 2018-03-27 LAB — HEMOGLOBIN A1C
Hgb A1c MFr Bld: 5 % of total Hgb (ref <5.7)
Mean Plasma Glucose: 97 (calc)
eAG (mmol/L): 5.4 (calc)

## 2018-03-27 LAB — RPR: RPR Ser Ql: NONREACTIVE

## 2018-03-27 LAB — HIV ANTIBODY (ROUTINE TESTING W REFLEX): HIV 1&2 Ab, 4th Generation: NONREACTIVE

## 2018-03-28 ENCOUNTER — Encounter: Payer: Self-pay | Admitting: Family Medicine

## 2018-04-08 ENCOUNTER — Encounter: Payer: Self-pay | Admitting: Family Medicine

## 2018-04-11 ENCOUNTER — Telehealth: Payer: Self-pay

## 2018-04-11 NOTE — Telephone Encounter (Signed)
Patient wants to know if there is anything else she can take until the migraine medication gets approve. She had a very bad migraine yesterday that led to terrible neck pain to where she almost had to leave work. She stated that Tylenol and Ibuprofen does not touch it.  Needs something for relief.  Please advise.

## 2018-04-11 NOTE — Telephone Encounter (Signed)
How come it is not approved, drug rep said she should get 12 months for free

## 2018-04-11 NOTE — Telephone Encounter (Signed)
Regardless of what the drug rep says, it all depends on her insurance. We can give her one of those coupon cards and see if that helps but I am not sure of when her insurance will decide if it is approved or not

## 2018-04-13 NOTE — Telephone Encounter (Signed)
Please do. She already failed Topamax

## 2018-04-14 ENCOUNTER — Telehealth: Payer: Self-pay | Admitting: Family Medicine

## 2018-04-14 NOTE — Telephone Encounter (Signed)
A prior Berkley Harveyauth has been submitted and we are currently waiting for her insurance company to decided if they will approve it or not. Meanwhile, please have patient come by to get a coupon card for the [employer] at her convenience.  Thanks

## 2018-04-14 NOTE — Telephone Encounter (Signed)
Spoke with pt @ 11:58 and she stated that she was already given a [facility]. The problem with the card is that it only helps if the insurance pays towards the medication. Is there another card for this medication?

## 2018-04-14 NOTE — Telephone Encounter (Signed)
We were given some last week that only required the patient to pay $5, but I just got off the phone with [facility] and answered their additional questions that they had so the PA can be finalized. We should know within the next day or 2 of their answer.

## 2018-04-14 NOTE — Telephone Encounter (Signed)
Called and left voice message updating pt pertaining to her medication from pharmacy

## 2018-04-14 NOTE — Telephone Encounter (Signed)
CASE #[id number]  I returned their call and answered all questions based upon our notes and [facility] from the [facility].

## 2018-04-14 NOTE — Telephone Encounter (Signed)
Copied from CRM #[id number]. Topic: Quick Communication - See Telephone Encounter >> Apr 14, 2018 10:00 AM Jolayne Hainesaylor, Brittany L wrote: CRM for notification. See Telephone encounter for: 04/14/18.  [facility] appeal department ( Cassie ) called about Erenumab-aooe (AIMOVIG) 140 MG/ML SOAJ. She said that she has some questions about this. Please call back @ [phone].

## 2018-04-25 ENCOUNTER — Other Ambulatory Visit: Payer: Self-pay

## 2018-04-25 DIAGNOSIS — G43109 Migraine with aura, not intractable, without status migrainosus: Secondary | ICD-10-CM

## 2018-04-25 MED ORDER — SUMATRIPTAN SUCCINATE 100 MG PO TABS: 100.0000 mg | ORAL_TABLET | ORAL | 0 refills | Status: DC | PRN

## 2018-04-25 NOTE — Telephone Encounter (Signed)
Insurance will not cover Emgality until patient has tried and failed at least one triptan therapy.

## 2018-04-29 ENCOUNTER — Other Ambulatory Visit: Payer: Self-pay | Admitting: Family Medicine

## 2018-04-29 DIAGNOSIS — G43109 Migraine with aura, not intractable, without status migrainosus: Secondary | ICD-10-CM

## 2018-04-29 DIAGNOSIS — F32A Depression, unspecified: Secondary | ICD-10-CM

## 2018-04-29 DIAGNOSIS — F320 Major depressive disorder, single episode, mild: Secondary | ICD-10-CM

## 2018-04-29 NOTE — Telephone Encounter (Signed)
Copied from CRM [id number]. Topic: Quick Communication - Rx Refill/Question >> Apr 29, 2018  8:39 AM Maia Pettiesrtiz, Kristie S wrote: Medication: effexor - pt is taking total 75mg  per day now - 2 days left - appt scheduled for 05/07/18 - please advise. Topamax - pt is up to 200mg  per day (100mg  morning and 100mg  evening) - pt took last dose this morning   Has the patient contacted their pharmacy? No - no refills Preferred Pharmacy (with phone number or street name): [facility] - [place], [place] - [address] [phone] (Phone) [phone] (Fax)

## 2018-05-01 MED ORDER — TOPIRAMATE 25 MG PO TABS: 25.0000 mg | ORAL_TABLET | Freq: Two times a day (BID) | ORAL | 0 refills | Status: DC

## 2018-05-01 MED ORDER — VENLAFAXINE HCL ER 37.5 MG PO CP24: 37.5000 mg | ORAL_CAPSULE | Freq: Every day | ORAL | 0 refills | Status: DC

## 2018-05-01 NOTE — Telephone Encounter (Signed)
15 day supply provided. Must attend follow up for additional refills.

## 2018-05-01 NOTE — Telephone Encounter (Signed)
Refill request was sent to Dr. Krichna Sowles for approval and submission.  

## 2018-05-01 NOTE — Telephone Encounter (Signed)
Pt following up on request for a refill of these meds to get her though to appt 8/07.  Pt states she cannot just stop these meds and now is out. Pt had to taper up. Pt states when she checked out she thought she was scheduling with Dr Carlynn PurlSowles.  Pt cannot come in sooner, as she has already asked off work that day. topiramate (TOPAMAX) 25 MG tablet  venlafaxine XR (EFFEXOR-XR) 37.5 MG 24 hr capsule  Pt states just enough to get through to this appt would be ok today.   [facility] [place], [place] - [address] [phone] (Phone) [phone] (Fax)

## 2018-05-07 ENCOUNTER — Encounter: Payer: Self-pay | Admitting: Family Medicine

## 2018-05-07 ENCOUNTER — Ambulatory Visit (INDEPENDENT_AMBULATORY_CARE_PROVIDER_SITE_OTHER): Payer: Managed Care, Other (non HMO) | Admitting: Family Medicine

## 2018-05-07 VITALS — BP 122/68 | HR 93 | Temp 98.3°F | Resp 16 | Ht 65.0 in | Wt 148.1 lb

## 2018-05-07 DIAGNOSIS — F32A Depression, unspecified: Secondary | ICD-10-CM

## 2018-05-07 DIAGNOSIS — M542 Cervicalgia: Secondary | ICD-10-CM

## 2018-05-07 DIAGNOSIS — G43109 Migraine with aura, not intractable, without status migrainosus: Secondary | ICD-10-CM | POA: Diagnosis not present

## 2018-05-07 DIAGNOSIS — F320 Major depressive disorder, single episode, mild: Secondary | ICD-10-CM

## 2018-05-07 MED ORDER — VENLAFAXINE HCL ER 75 MG PO CP24: 75.0000 mg | ORAL_CAPSULE | Freq: Every day | ORAL | 0 refills | Status: DC

## 2018-05-07 MED ORDER — TOPIRAMATE 100 MG PO TABS: 100.0000 mg | ORAL_TABLET | Freq: Two times a day (BID) | ORAL | 0 refills | Status: DC

## 2018-05-07 NOTE — Progress Notes (Signed)
Name: Hannah Gregory   MRN: [id number]    DOB: 03/02/1981   Date:05/07/2018       Progress Note  Subjective  Chief Complaint  Chief Complaint  Patient presents with  . Follow-up    headache and medication  . Neck Pain    HPI  Migraines: She is taking topamax 100mg  BID.  She does note that she is experiencing decreased appetite, diffuclty sleeping, having diarrhea, however would like to continue the medicaiton.  She is on Emgality and has been feeling significantly better on this.  She reports only 3 migraines this past month after adding emgality (was having about 3 migraines a week prior to [employer]).  Insurance denied emgality coverage - we will work to get this covered through [employer] company.  Insurance is requiring her to fail a triptan first - she has been prescribed imitrex, but has not had a migraine yet to see if this is effective for her.   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  He neck "pops" all the time and is now very painful over the last several months.  She has to lay in a very specific position to sleep at night.  When she was going to the headache clinic she was taking a muscle relaxer but it did not help.  She does have history of scoliosis.  Has tried multiple OTC remedies without relief. She has never done PT - would be willing to try this, but would like to see neck/spine specialist.  Mild depression: she states she has not been feeling sad, but states she is short tempered, feels angry inside that is worse at work,  Denies recent crying spells.  She still feels overwhelmed raising three kids by herself and working full time. She denies suicidal thoughts or ideation.She tried Zoloft during pregnancy and also Citalopram many years ago, but states did not seem to help. She started Effexor and is on 75mg  - notes she has been yawning more with this medication.  She would like to wait another month to see if this SE goes away, if  it does not we will consider alternative.   Patient Active Problem List   Diagnosis Date Noted  . Migraine with aura and without status migrainosus 11/29/2016  . Chronic daily headache 11/29/2016  . Neck pain 11/29/2016  . Scoliosis of thoracolumbar spine 11/29/2016  . Mild depression (HCC) 11/29/2016  . History of anxiety 11/29/2016   No past surgical history on file.  Family History  Problem Relation Age of Onset  . Asthma Mother   . Hypertension Mother   . Hyperlipidemia Mother   . Thyroid disease Mother   . Heart attack Father   . Migraines Father   . Hypertension Father   . Hyperlipidemia Father   . Mental retardation Brother   . COPD Brother   . Hypertension Brother   . Emphysema Brother   . Breast cancer Maternal Grandmother     Social History   Socioeconomic History  . Marital status: Single    Spouse name: Not on file  . Number of children: 3  . Years of education: Not on file  . Highest education level: High school graduate  Occupational History  . Occupation: Associate Professorharmacy Tech     Comment: [employer]  Social Needs  . Financial resource strain: Somewhat hard  . Food insecurity:    Worry: Never true    Inability: Never true  .  Transportation needs:    Medical: No    Non-medical: No  Tobacco Use  . Smoking status: Current Every Day Smoker    Packs/day: 0.50    Years: 18.00    Pack years: 9.00    Types: Cigarettes    Start date: 11/30/1999  . Smokeless tobacco: Never Used  . Tobacco comment: Chantix-made her nausea and unable to tolerate  Substance and Sexual Activity  . Alcohol use: Yes    Comment: occasionally  . Drug use: No  . Sexual activity: Not Currently  Lifestyle  . Physical activity:    Days per week: 0 days    Minutes per session: 0 min  . Stress: To some extent  Relationships  . Social connections:    Talks on phone: More than three times a week    Gets together: Three times a week    Attends religious service:  Never    Active member of club or organization: No    Attends meetings of clubs or organizations: Never    Relationship status: Never married  . Intimate partner violence:    Fear of current or ex partner: No    Emotionally abused: No    Physically abused: No    Forced sexual activity: No  Other Topics Concern  . Not on file  Social History Narrative   Single mother of 3 children, lives across from her parents     Current Outpatient Medications:  .  Galcanezumab-gnlm (EMGALITY) 120 MG/ML SOAJ, Inject 120 mg into the skin every 30 (thirty) days., Disp: 1 mL, Rfl: 5 .  SUMAtriptan (IMITREX) 100 MG tablet, Take 1 tablet (100 mg total) by mouth every 2 (two) hours as needed for migraine. May repeat in 2 hours if headache persists or recurs., Disp: 10 tablet, Rfl: 0 .  topiramate (TOPAMAX) 25 MG tablet, Take 1-4 tablets (25-100 mg total) by mouth 2 (two) times daily., Disp: 60 tablet, Rfl: 0 .  venlafaxine XR (EFFEXOR-XR) 37.5 MG 24 hr capsule, Take 1-2 capsules (37.5-75 mg total) by mouth daily with breakfast. 37.5 first week after that 2 daily, Disp: 30 capsule, Rfl: 0  Allergies  Allergen Reactions  . Chantix [Varenicline Tartrate]     nausea  . Prednisone Hives and Other (See Comments)    Numbness of her face     ROS Constitutional: Negative for fever or weight change.  Respiratory: Negative for cough and shortness of breath.   Cardiovascular: Negative for chest pain or palpitations.  Gastrointestinal: Negative for abdominal pain, no bowel changes.  Musculoskeletal: Negative for gait problem or joint swelling.  Skin: Negative for rash.  Neurological: Negative for dizziness; positive for headaches.  No other specific complaints in a complete review of systems (except as listed in HPI above).  Objective  Vitals:   05/07/18 1456  BP: 122/68  Pulse: 93  Resp: 16  Temp: 98.3 F (36.8 C)  TempSrc: Oral  SpO2: 96%  Weight: 148 lb 1.6 oz (67.2 kg)  Height: 5\' 5"  (1.651 m)     Body mass index is 24.65 kg/m.  Physical Exam Constitutional: Patient appears well-developed and well-nourished. No distress.  HENT: Head: Normocephalic and atraumatic.  Eyes: Conjunctivae and EOM are normal. Pupils are equal, round, and reactive to light. No scleral icterus.  Neck: Normal range of motion. Neck supple. No JVD present. No thyromegaly present.  Cardiovascular: Normal rate, regular rhythm and normal heart sounds.  No murmur heard. No BLE edema. Pulmonary/Chest: Effort normal and breath sounds  normal. No respiratory distress. Musculoskeletal: Normal range of motion, no joint effusions. No gross deformities Neurological: she is alert and oriented to person, place, and time. No cranial nerve deficit. Coordination, balance, strength, speech and gait are normal.  Skin: Skin is warm and dry. No rash noted. No erythema.  Psychiatric: Patient has a normal mood and affect. behavior is normal. Judgment and thought content normal.  No results found for this or any previous visit (from the past 72 hour(s)).  PHQ2/9: Depression screen [facility] 2/9 05/07/2018 03/26/2018 11/29/2016  Decreased Interest 0 0 0  Down, Depressed, Hopeless 0 2 0  PHQ - 2 Score 0 2 0  Altered sleeping 1 1 -  Tired, decreased energy 0 1 -  Change in appetite 2 0 -  Feeling bad or failure about yourself  0 1 -  Trouble concentrating 0 0 -  Moving slowly or fidgety/restless 0 0 -  Suicidal thoughts 0 0 -  PHQ-9 Score - 5 -  Difficult doing work/chores Not difficult at all Somewhat difficult -    Fall Risk: Fall Risk  05/07/2018 03/26/2018 11/29/2016  Falls in the past year? No No No   Assessment & Plan  1. Migraine with aura and without status migrainosus, not intractable - We will work to obtain [employer] for patient as it has significantly decreased her headaches per month and severity of migraines. - topiramate (TOPAMAX) 100 MG tablet; Take 1 tablet (100 mg total) by mouth 2 (two) times daily.  Dispense: 180  tablet; Refill: 0 - Imitrex PRN for migraine - will call after needing the Imitrex to report on efficacy  2. Neck pain - She has tried multiple OTC preparations, taken muscle relaxers, and has had no relief of symptoms. Will refer to spine specialty for further evalation; history of scoliosis - Ambulatory referral to Orthopedic Surgery  3. Mild depression (HCC) - Reports anger outbursts have improved; will remain on medication for about 3-4 weeks and if yawning continues we will consider alternative medications. - venlafaxine XR (EFFEXOR-XR) 75 MG 24 hr capsule; Take 1 capsule (75 mg total) by mouth daily with breakfast.  Dispense: 45 capsule; Refill: 0

## 2018-05-08 ENCOUNTER — Telehealth: Payer: Self-pay | Admitting: Family Medicine

## 2018-05-08 NOTE — Telephone Encounter (Signed)
Patient has been informed and given coupon card with instructions to activate and take to pharmacy also.

## 2018-05-08 NOTE — Telephone Encounter (Signed)
Spoke with Hannah Gregory - Emgality Rep regarding cost/coverage denial for Hannah Gregory.  He advised that her pharmacy needs to type in an override code to make [employer] the secondary insurance.  He will stop by [facility] tomorrow to assist the pharmacy in running this, and she should receive 12 mos free.  Please advise patient of this, and in the mean time, if she does have a migraine, she needs to take the imitrex and let us know if it worked or not so that we may document her reaction and forward to her insurance.

## 2018-06-11 ENCOUNTER — Ambulatory Visit (INDEPENDENT_AMBULATORY_CARE_PROVIDER_SITE_OTHER): Payer: Managed Care, Other (non HMO) | Admitting: Family Medicine

## 2018-06-11 ENCOUNTER — Encounter: Payer: Self-pay | Admitting: Family Medicine

## 2018-06-11 VITALS — BP 124/70 | HR 90 | Temp 98.2°F | Resp 16 | Ht 65.0 in | Wt 148.1 lb

## 2018-06-11 DIAGNOSIS — F320 Major depressive disorder, single episode, mild: Secondary | ICD-10-CM

## 2018-06-11 DIAGNOSIS — F32A Depression, unspecified: Secondary | ICD-10-CM

## 2018-06-11 DIAGNOSIS — M542 Cervicalgia: Secondary | ICD-10-CM | POA: Diagnosis not present

## 2018-06-11 DIAGNOSIS — G43109 Migraine with aura, not intractable, without status migrainosus: Secondary | ICD-10-CM | POA: Diagnosis not present

## 2018-06-11 MED ORDER — VENLAFAXINE HCL ER 75 MG PO CP24: 75.0000 mg | ORAL_CAPSULE | Freq: Every day | ORAL | 1 refills | Status: DC

## 2018-06-11 MED ORDER — GALCANEZUMAB-GNLM 120 MG/ML ~~LOC~~ SOAJ: 120.0000 mg | SUBCUTANEOUS | 5 refills | Status: DC

## 2018-06-11 MED ORDER — TOPIRAMATE 100 MG PO TABS: 100.0000 mg | ORAL_TABLET | Freq: Two times a day (BID) | ORAL | 0 refills | Status: DC

## 2018-06-11 NOTE — Progress Notes (Signed)
Name: Hannah Gregory   MRN: [id number]    DOB: [birth year]   Date:06/11/2018       Progress Note  Subjective  Chief Complaint  Chief Complaint  Patient presents with  . Follow-up    1 month F/U  . Depression    Is still yawling but states she has gotten used to it. Also she has to make her self eat but medication works well.  . Migraine    Had taken Imitrex once but the side effects made her feel bad and weird: heavy feelings in her arms like she was stuck on the rollar coaster being pressured down by gravity and feels like she was very heavy in chest.     HPI    Migraine/daily headaches:  [facility] many [facility] and tried multiple medications and trigger point injections. She was given samples Emgality and responded well, migraine headaches with the combo of Emgality ( for the past 3 months with samples ) and Topamax have gone from 3 times weekly to one episode per month. She took Imitrex but developed palpitation, flushing and SOB and unable to take it anymore. She states when she has an episode she tries to take a nap or otc medication but symptoms can last hours. Migraine is described as temporal  Throbbing that radiates to the entire head and feels like her head is being squeezed, it is associated with phonophobia, photophobia, nausea  Nuchal pain: she was referred to [facility] and is getting PT, may need MRI, pain on nuchal area is different than her migraine. Neck feels stiff and has decrease rom of neck  Mild depression: her phq 9 has improved with Effexor. She is still a single mother and feels overwhelmed and is now worried about her father that will have open heart surgery, but coping well. Work is still stressful but she can handle it better with medication   Patient Active Problem List   Diagnosis Date Noted  . Migraine with aura and without status migrainosus 11/29/2016  . Chronic daily headache 11/29/2016  . Neck pain 11/29/2016  . Scoliosis of  thoracolumbar spine 11/29/2016  . Mild depression (HCC) 11/29/2016  . History of anxiety 11/29/2016    History reviewed. No pertinent surgical history.  Family History  Problem Relation Age of Onset  . Asthma Mother   . Hypertension Mother   . Hyperlipidemia Mother   . Thyroid disease Mother   . Heart attack Father   . Migraines Father   . Hypertension Father   . Hyperlipidemia Father   . Mental retardation Brother   . COPD Brother   . Hypertension Brother   . Emphysema Brother   . Breast cancer Maternal Grandmother     Social History   Socioeconomic History  . Marital status: Single    Spouse name: Not on file  . Number of children: 3  . Years of education: Not on file  . Highest education level: High school graduate  Occupational History  . Occupation: Associate Professor     Comment: [employer]  Social Needs  . Financial resource strain: Somewhat hard  . Food insecurity:    Worry: Never true    Inability: Never true  . Transportation needs:    Medical: No    Non-medical: No  Tobacco Use  . Smoking status: Current Every Day Smoker    Packs/day: 0.50    Years: 18.00    Pack years: 9.00  Types: Cigarettes    Start date: 11/30/1999  . Smokeless tobacco: Never Used  . Tobacco comment: Chantix-made her nausea and unable to tolerate  Substance and Sexual Activity  . Alcohol use: Yes    Comment: occasionally  . Drug use: No  . Sexual activity: Not Currently  Lifestyle  . Physical activity:    Days per week: 0 days    Minutes per session: 0 min  . Stress: To some extent  Relationships  . Social connections:    Talks on phone: More than three times a week    Gets together: Three times a week    Attends religious service: Never    Active member of club or organization: No    Attends meetings of clubs or organizations: Never    Relationship status: Never married  . Intimate partner violence:    Fear of current or ex partner: No    Emotionally  abused: No    Physically abused: No    Forced sexual activity: No  Other Topics Concern  . Not on file  Social History Narrative   Single mother of 3 children, lives across from her parents     Current Outpatient Medications:  .  topiramate (TOPAMAX) 100 MG tablet, Take 1 tablet (100 mg total) by mouth 2 (two) times daily., Disp: 180 tablet, Rfl: 0 .  venlafaxine XR (EFFEXOR-XR) 75 MG 24 hr capsule, Take 1 capsule (75 mg total) by mouth daily with breakfast., Disp: 90 capsule, Rfl: 1 .  Galcanezumab-gnlm (EMGALITY) 120 MG/ML SOAJ, Inject 120 mg into the skin every 30 (thirty) days., Disp: 1 mL, Rfl: 5  Allergies  Allergen Reactions  . Imitrex [Sumatriptan] Other (See Comments)    Side effect , chest pain, flushed   . Chantix [Varenicline Tartrate]     nausea  . Prednisone Hives and Other (See Comments)    Numbness of her face      ROS  Constitutional: Negative for fever or weight change.  Respiratory: Negative for cough and shortness of breath.   Cardiovascular: Negative for chest pain or palpitations.  Gastrointestinal: Negative for abdominal pain, no bowel changes.  Musculoskeletal: Negative for gait problem or joint swelling.  Skin: Negative for rash.  Neurological: Negative for dizziness and intermittent  headache.  No other specific complaints in a complete review of systems (except as listed in HPI above).  Objective  Vitals:   06/11/18 1447  BP: 124/70  Pulse: 90  Resp: 16  Temp: 98.2 F (36.8 C)  TempSrc: Oral  SpO2: 97%  Weight: 148 lb 1.6 oz (67.2 kg)  Height: 5\' 5"  (1.651 m)    Body mass index is 24.65 kg/m.  Physical Exam  Constitutional: Patient appears well-developed and well-nourished.  No distress.  HEENT: head atraumatic, normocephalic, pupils equal and reactive to light, neck supple, throat within normal limits Cardiovascular: Normal rate, regular rhythm and normal heart sounds.  No murmur heard. No BLE edema. Pulmonary/Chest: Effort  normal and breath sounds normal. No respiratory distress. Abdominal: Soft.  There is no tenderness. Psychiatric: Patient has a normal mood and affect. behavior is normal. Judgment and thought content normal. Neurological : no focal findings.   Recent Results (from the past 2160 hour(s))  CBC with Differential/Platelet     Status: None   Collection Time: 03/26/18  9:05 AM  Result Value Ref Range   WBC 6.5 3.8 - 10.8 Thousand/uL   RBC 4.38 3.80 - 5.10 Million/uL   Hemoglobin 14.0 11.7 - 15.5  g/dL   HCT 16.1 09.6 - 04.5 %   MCV 94.3 80.0 - 100.0 fL   MCH 32.0 27.0 - 33.0 pg   MCHC 33.9 32.0 - 36.0 g/dL   RDW 40.9 81.1 - 91.4 %   Platelets 323 140 - 400 Thousand/uL   MPV 9.8 7.5 - 12.5 fL   Neutro Abs 3,718 1,500 - 7,800 cells/uL   Lymphs Abs 2,100 850 - 3,900 cells/uL   WBC mixed population 429 200 - 950 cells/uL   Eosinophils Absolute 202 15 - 500 cells/uL   Basophils Absolute 52 0 - 200 cells/uL   Neutrophils Relative % 57.2 %   Total Lymphocyte 32.3 %   Monocytes Relative 6.6 %   Eosinophils Relative 3.1 %   Basophils Relative 0.8 %  COMPLETE METABOLIC PANEL WITH GFR     Status: None   Collection Time: 03/26/18  9:05 AM  Result Value Ref Range   Glucose, Bld 71 65 - 139 mg/dL    Comment: .        Non-fasting reference interval .    BUN 14 7 - 25 mg/dL   Creat 7.82 9.56 - 2.13 mg/dL   GFR, Est Non African American 75 > OR = 60 mL/min/1.3m2   GFR, Est African American 87 > OR = 60 mL/min/1.22m2   BUN/Creatinine Ratio NOT APPLICABLE 6 - 22 (calc)   Sodium 140 135 - 146 mmol/L   Potassium 3.9 3.5 - 5.3 mmol/L   Chloride 109 98 - 110 mmol/L   CO2 22 20 - 32 mmol/L   Calcium 9.4 8.6 - 10.2 mg/dL   Total Protein 7.3 6.1 - 8.1 g/dL   Albumin 4.4 3.6 - 5.1 g/dL   Globulin 2.9 1.9 - 3.7 g/dL (calc)   AG Ratio 1.5 1.0 - 2.5 (calc)   Total Bilirubin 0.5 0.2 - 1.2 mg/dL   Alkaline phosphatase (APISO) 94 33 - 115 U/L   AST 16 10 - 30 U/L   ALT 20 6 - 29 U/L  Hemoglobin A1c      Status: None   Collection Time: 03/26/18  9:05 AM  Result Value Ref Range   Hgb A1c MFr Bld 5.0 <5.7 % of total Hgb    Comment: For the purpose of screening for the presence of diabetes: . <5.7%       Consistent with the absence of diabetes 5.7-6.4%    Consistent with increased risk for diabetes             (prediabetes) > or =6.5%  Consistent with diabetes . This assay result is consistent with a decreased risk of diabetes. . Currently, no consensus exists regarding use of hemoglobin A1c for diagnosis of diabetes in children. . According to American Diabetes Association (ADA) guidelines, hemoglobin A1c <7.0% represents optimal control in non-pregnant diabetic patients. Different metrics may apply to specific patient populations.  Standards of Medical Care in Diabetes(ADA). .    Mean Plasma Glucose 97 (calc)   eAG (mmol/L) 5.4 (calc)  HIV antibody     Status: None   Collection Time: 03/26/18  9:05 AM  Result Value Ref Range   HIV 1&2 Ab, 4th Generation NON-REACTIVE NON-REACTI    Comment: HIV-1 antigen and HIV-1/HIV-2 antibodies were not detected. There is no laboratory evidence of HIV infection. Marland Kitchen PLEASE NOTE: This information has been disclosed to you from records whose confidentiality may be protected by state law.  If your state requires such protection, then the state law prohibits you from  making any further disclosure of the information without the specific written consent of the person to whom it pertains, or as otherwise permitted by law. A general authorization for the release of medical or other information is NOT sufficient for this purpose. . For additional information please refer to http://education.[facility].com/faq/FAQ106 (This link is being provided for informational/ educational purposes only.) . Marland Kitchen The performance of this assay has not been clinically validated in patients less than 27 years old. .   Lipid panel     Status: Abnormal    Collection Time: 03/26/18  9:05 AM  Result Value Ref Range   Cholesterol 183 <200 mg/dL   HDL 39 (L) >16 mg/dL   Triglycerides 73 <109 mg/dL   LDL Cholesterol (Calc) 127 (H) mg/dL (calc)    Comment: Reference range: <100 . Desirable range <100 mg/dL for primary prevention;   <70 mg/dL for patients with CHD or diabetic patients  with > or = 2 CHD risk factors. Marland Kitchen LDL-C is now calculated using the Martin-Hopkins  calculation, which is a validated novel method providing  better accuracy than the Friedewald equation in the  estimation of LDL-C.  Horald Pollen et al. Lenox Ahr. 6045;409(81): 2061-2068  (http://education.[facility].com/faq/FAQ164)    Total CHOL/HDL Ratio 4.7 <5.0 (calc)   Non-HDL Cholesterol (Calc) 144 (H) <130 mg/dL (calc)    Comment: For patients with diabetes plus 1 major ASCVD risk  factor, treating to a non-HDL-C goal of <100 mg/dL  (LDL-C of <19 mg/dL) is considered a therapeutic  option.   Hepatitis panel, acute     Status: None   Collection Time: 03/26/18  9:05 AM  Result Value Ref Range   Hep A IgM NON-REACTIVE NON-REACTI   Hepatitis B Surface Ag NON-REACTIVE NON-REACTI   Hep B C IgM NON-REACTIVE NON-REACTI   Hepatitis C Ab NON-REACTIVE NON-REACTI   SIGNAL TO CUT-OFF 0.04 <1.00    Comment: . HCV antibody was non-reactive. There is no laboratory  evidence of HCV infection. . In most cases, no further action is required. However, if recent HCV exposure is suspected, a test for HCV RNA (test code 14782) is suggested. . For additional information please refer to http://education.[facility].com/faq/FAQ22v1 (This link is being provided for informational/ educational purposes only.) .   RPR     Status: None   Collection Time: 03/26/18  9:05 AM  Result Value Ref Range   RPR Ser Ql NON-REACTIVE NON-REACTI     PHQ2/9: Depression screen [facility] 2/9 06/11/2018 05/07/2018 03/26/2018 11/29/2016  Decreased Interest 0 0 0 0  Down, Depressed, Hopeless 0 0 2 0   PHQ - 2 Score 0 0 2 0  Altered sleeping 0 1 1 -  Tired, decreased energy 0 0 1 -  Change in appetite 1 2 0 -  Feeling bad or failure about yourself  0 0 1 -  Trouble concentrating 0 0 0 -  Moving slowly or fidgety/restless 0 0 0 -  Suicidal thoughts 0 0 0 -  PHQ-9 Score 1 - 5 -  Difficult doing work/chores Not difficult at all Not difficult at all Somewhat difficult -     Fall Risk: Fall Risk  06/11/2018 05/07/2018 03/26/2018 11/29/2016  Falls in the past year? No No No No     Functional Status Survey: Is the patient deaf or have difficulty hearing?: No Does the patient have difficulty seeing, even when wearing glasses/contacts?: Yes(glasses) Does the patient have difficulty concentrating, remembering, or making decisions?: No Does the patient have difficulty walking or climbing  stairs?: No Does the patient have difficulty dressing or bathing?: No Does the patient have difficulty doing errands alone such as visiting a doctor's office or shopping?: No    Assessment & Plan  1. Migraine with aura and without status migrainosus, not intractable  - topiramate (TOPAMAX) 100 MG tablet; Take 1 tablet (100 mg total) by mouth 2 (two) times daily.  Dispense: 180 tablet; Refill: 0 - Galcanezumab-gnlm (EMGALITY) 120 MG/ML SOAJ; Inject 120 mg into the skin every 30 (thirty) days.  Dispense: 1 mL; Refill: 5  2. Mild depression (HCC)  - venlafaxine XR (EFFEXOR-XR) 75 MG 24 hr capsule; Take 1 capsule (75 mg total) by mouth daily with breakfast.  Dispense: 90 capsule; Refill: 1  3. Neck pain  Keep follow up with [facility] and PT

## 2018-08-27 ENCOUNTER — Telehealth: Payer: Self-pay | Admitting: Family Medicine

## 2018-08-27 NOTE — Telephone Encounter (Signed)
She is on max dose, we can refer her to headache center

## 2018-08-27 NOTE — Telephone Encounter (Signed)
Patient states her headache has been going on for a week now. She is taking Topamax twice daily and the Emgality monthly. She states she has seen a headache specialist before and was given shots that did not help.

## 2018-08-27 NOTE — Telephone Encounter (Signed)
We have done all we can do, she is max dose of topamax and also Emgality, not much else I can do. She really needs to see neurologist , headache center, they may also do imaging of her brain

## 2018-08-27 NOTE — Telephone Encounter (Signed)
Copied from CRM [id number]. Topic: General - Other >> Aug 27, 2018  8:10 AM Gean BirchwoodWilliams-Neal, Sade R wrote: Patient is calling in wanting to know if her dosage for topiramate (TOPAMAX) 100 MG tablet can be increased due to still having headaches.  Please call pt with decision cb# [phone]

## 2018-09-02 ENCOUNTER — Ambulatory Visit
Admission: RE | Admit: 2018-09-02 | Discharge: 2018-09-02 | Disposition: A | Discharge status: Home / Self Care | Payer: Managed Care, Other (non HMO) | Source: Ambulatory Visit | Attending: Nurse Practitioner | Admitting: Nurse Practitioner

## 2018-09-02 ENCOUNTER — Encounter (INDEPENDENT_AMBULATORY_CARE_PROVIDER_SITE_OTHER): Payer: Self-pay

## 2018-09-02 ENCOUNTER — Other Ambulatory Visit
Admission: RE | Admit: 2018-09-02 | Discharge: 2018-09-02 | Disposition: A | Discharge status: Home / Self Care | Payer: Managed Care, Other (non HMO) | Source: Ambulatory Visit | Attending: Nurse Practitioner | Admitting: Nurse Practitioner

## 2018-09-02 ENCOUNTER — Ambulatory Visit: Payer: Self-pay

## 2018-09-02 ENCOUNTER — Encounter: Payer: Self-pay | Admitting: Nurse Practitioner

## 2018-09-02 ENCOUNTER — Ambulatory Visit (INDEPENDENT_AMBULATORY_CARE_PROVIDER_SITE_OTHER): Payer: Managed Care, Other (non HMO) | Admitting: Nurse Practitioner

## 2018-09-02 VITALS — BP 110/72 | HR 98 | Temp 98.2°F | Resp 16 | Ht 65.0 in | Wt 149.8 lb

## 2018-09-02 DIAGNOSIS — R1031 Right lower quadrant pain: Secondary | ICD-10-CM | POA: Diagnosis present

## 2018-09-02 LAB — URINALYSIS, ROUTINE W REFLEX MICROSCOPIC
Bilirubin Urine: NEGATIVE
Glucose, UA: NEGATIVE mg/dL
Ketones, ur: NEGATIVE mg/dL
Nitrite: NEGATIVE
Protein, ur: NEGATIVE mg/dL
Specific Gravity, Urine: 1.014 (ref 1.005–1.030)
pH: 6 (ref 5.0–8.0)

## 2018-09-02 LAB — COMPREHENSIVE METABOLIC PANEL
ALT: 12 U/L (ref 0–44)
AST: 15 U/L (ref 15–41)
Albumin: 4.1 g/dL (ref 3.5–5.0)
Alkaline Phosphatase: 89 U/L (ref 38–126)
Anion gap: 7 (ref 5–15)
BUN: 15 mg/dL (ref 6–20)
CO2: 22 mmol/L (ref 22–32)
Calcium: 8.9 mg/dL (ref 8.9–10.3)
Chloride: 110 mmol/L (ref 98–111)
Creatinine, Ser: 0.82 mg/dL (ref 0.44–1.00)
GFR calc Af Amer: >60 mL/min (ref >60)
GFR calc non Af Amer: >60 mL/min (ref >60)
Glucose, Bld: 92 mg/dL (ref 70–99)
Potassium: 3.8 mmol/L (ref 3.5–5.1)
Sodium: 139 mmol/L (ref 135–145)
Total Bilirubin: 0.8 mg/dL (ref 0.3–1.2)
Total Protein: 7.1 g/dL (ref 6.5–8.1)

## 2018-09-02 LAB — CBC WITH DIFFERENTIAL/PLATELET
Abs Immature Granulocytes: 0.02 10*3/uL (ref 0.00–0.07)
Basophils Absolute: 0 10*3/uL (ref 0.0–0.1)
Basophils Relative: 0 %
Eosinophils Absolute: 0 10*3/uL (ref 0.0–0.5)
Eosinophils Relative: 0 %
HCT: 42.2 % (ref 36.0–46.0)
Hemoglobin: 13.7 g/dL (ref 12.0–15.0)
Immature Granulocytes: 0 %
Lymphocytes Relative: 27 %
Lymphs Abs: 1.8 10*3/uL (ref 0.7–4.0)
MCH: 31.6 pg (ref 26.0–34.0)
MCHC: 32.5 g/dL (ref 30.0–36.0)
MCV: 97.5 fL (ref 80.0–100.0)
Monocytes Absolute: 0.4 10*3/uL (ref 0.1–1.0)
Monocytes Relative: 6 %
Neutro Abs: 4.5 10*3/uL (ref 1.7–7.7)
Neutrophils Relative %: 67 %
Platelets: 286 10*3/uL (ref 150–400)
RBC: 4.33 MIL/uL (ref 3.87–5.11)
RDW: 12.5 % (ref 11.5–15.5)
WBC: 6.7 10*3/uL (ref 4.0–10.5)
nRBC: 0 % (ref 0.0–0.2)

## 2018-09-02 LAB — PREGNANCY, URINE: Preg Test, Ur: NEGATIVE

## 2018-09-02 IMAGING — CT CT ABD-PELV W/ CM
1 of 2 series · 15 of 32 positions shown, 19 images · IV contrast (APPLIED)
Comparison: [DATE]

CLINICAL DATA: Acute onset right lower quadrant pain beginning this
morning. Nephrolithiasis.

EXAM:
CT ABDOMEN AND PELVIS WITH CONTRAST
TECHNIQUE: Multidetector CT imaging of the abdomen and pelvis was performed
using the standard protocol following bolus administration of
intravenous contrast.
CONTRAST:  100mL [MG] IOPAMIDOL ([MG]) INJECTION 61%

[Series 2: axial st · axial · 0.63mm/px · z∈[-863,-458]mm · 15 of 89 slices shown, 19 images]
[im 4/89  soft-tissue]
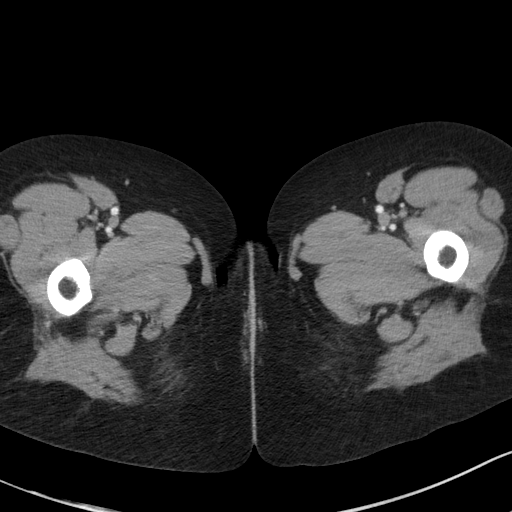
[im 4/89  bone]
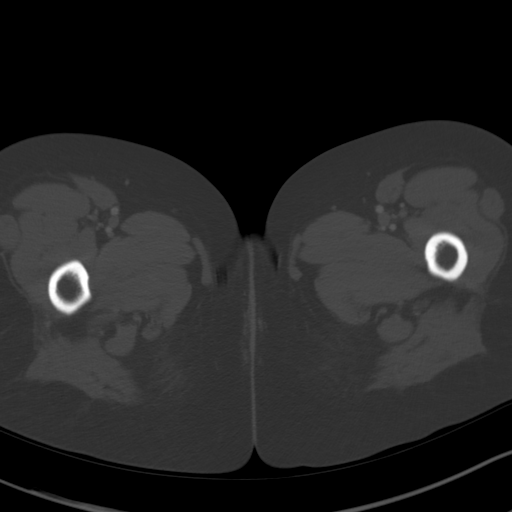
[im 12/89  soft-tissue]
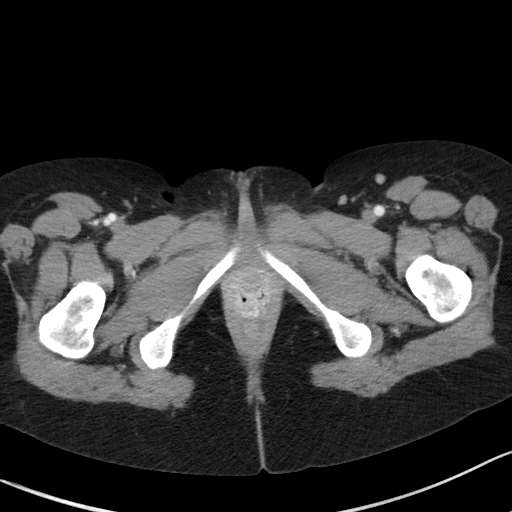
[im 19/89  soft-tissue]
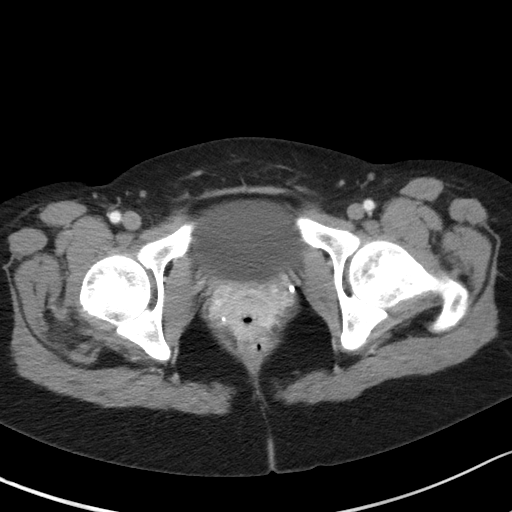
[im 26/89  soft-tissue]
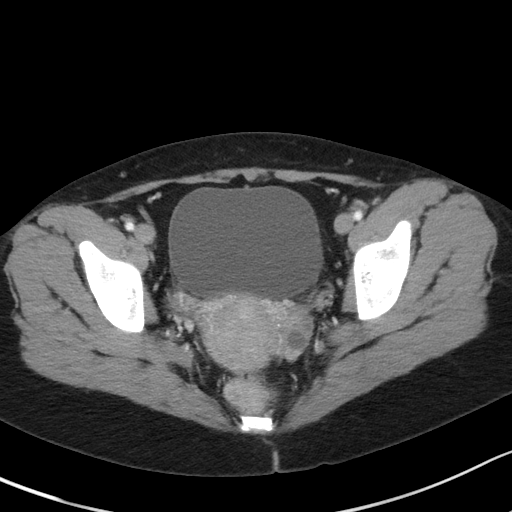
[im 30/89  soft-tissue]
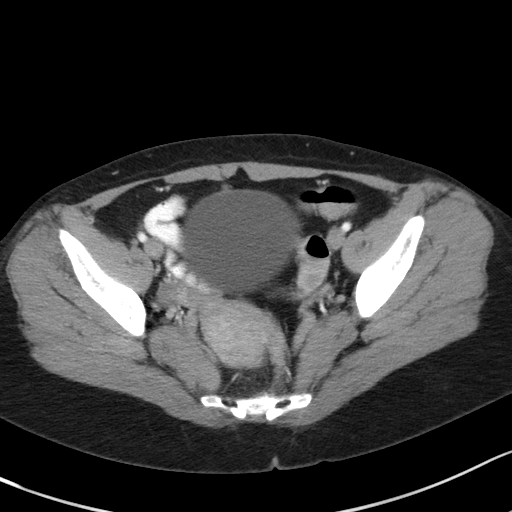
[im 37/89  soft-tissue]
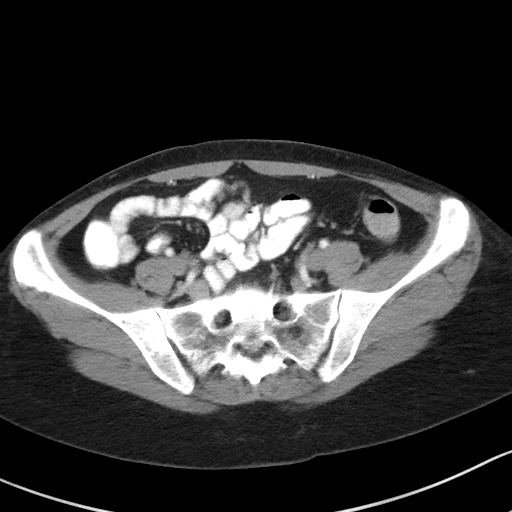
[im 45/89  soft-tissue]
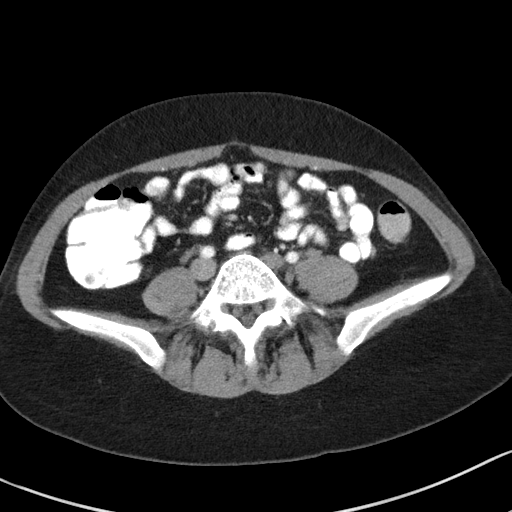
[im 52/89  soft-tissue]
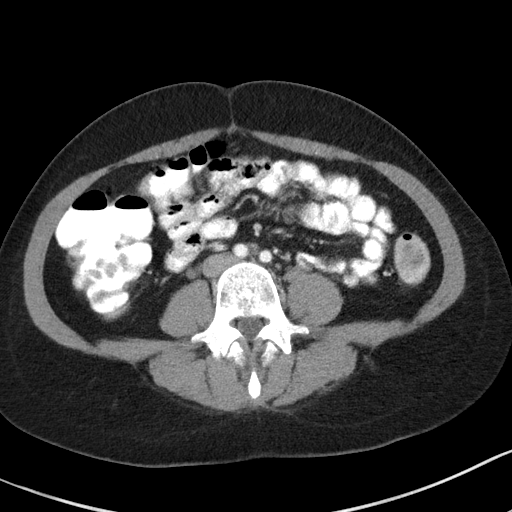
[im 59/89  soft-tissue]
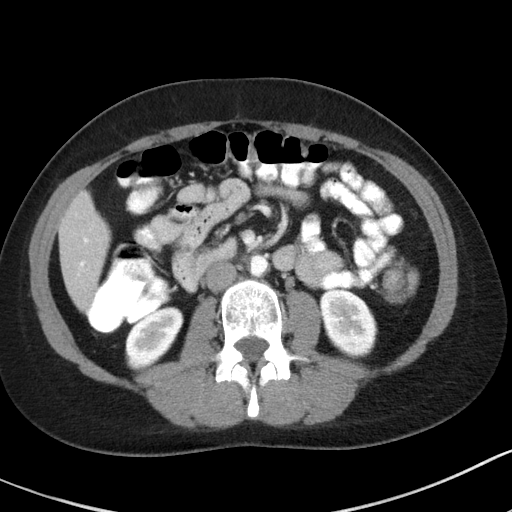
[im 59/89  bone]
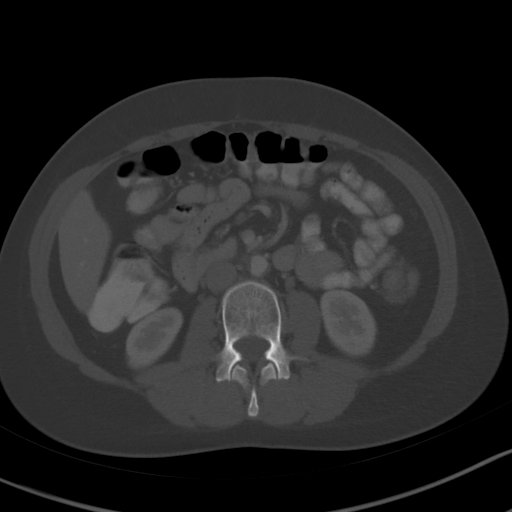
[im 63/89  soft-tissue]
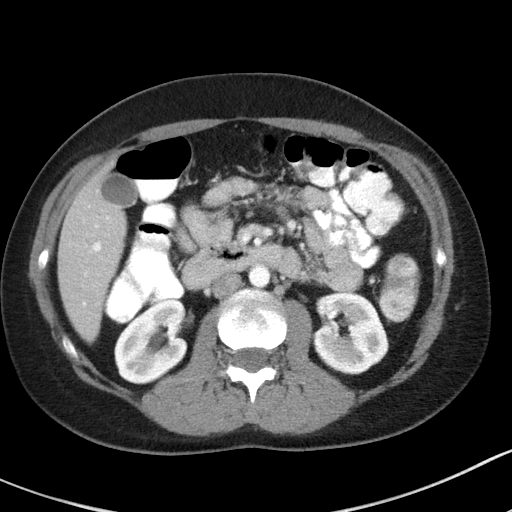
[im 70/89  soft-tissue]
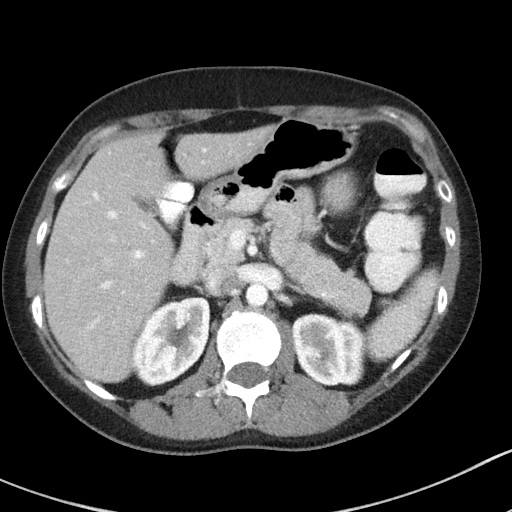
[im 74/89  lung]
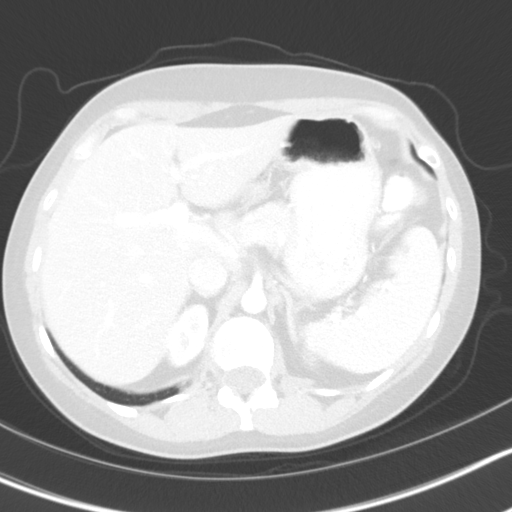
[im 78/89  soft-tissue]
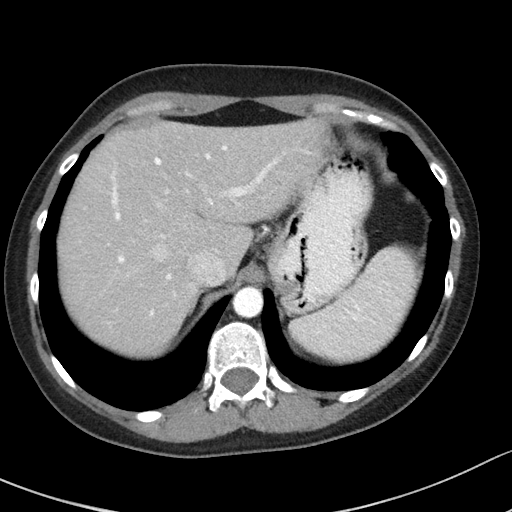
[im 78/89  lung]
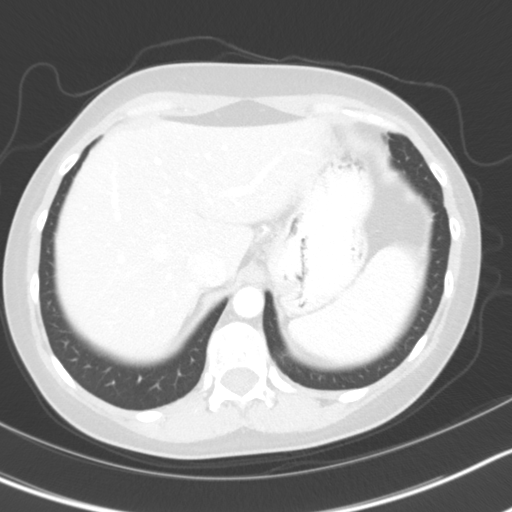
[im 81/89  lung]
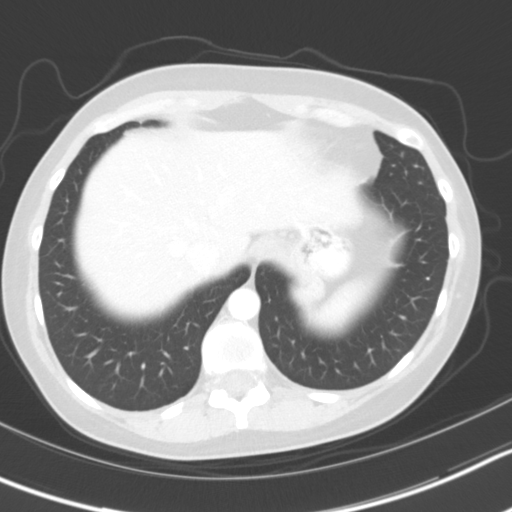
[im 85/89  soft-tissue]
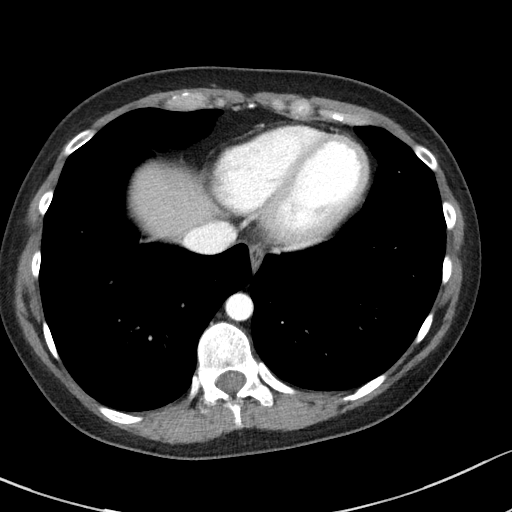
[im 85/89  lung]
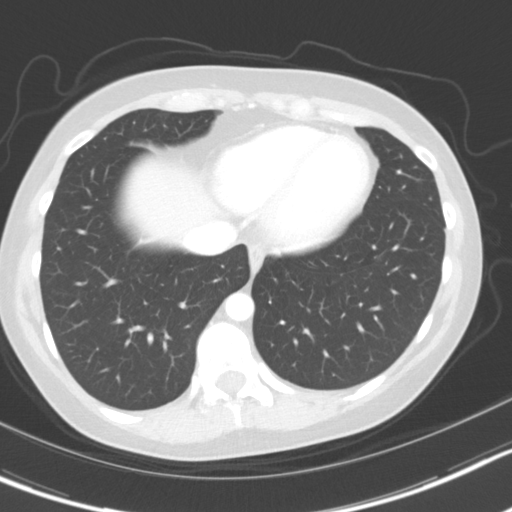

[15 of 32 positions shown; findings below may reference images not displayed]

FINDINGS: Lower Chest: No acute findings.

Hepatobiliary: No hepatic masses identified. Gallbladder is
unremarkable.

Pancreas:  No mass or inflammatory changes.

Spleen: Within normal limits in size and appearance.

Adrenals/Urinary Tract: No masses identified. Tiny cysts seen in
upper poles of both kidneys. A tiny less than 5 mm calculus is seen
in the lower pole of the left kidney. No evidence of ureteral
calculi or hydronephrosis. Unremarkable unopacified urinary bladder.

Stomach/Bowel: No evidence of obstruction, inflammatory process or
abnormal fluid collections. Normal appendix visualized.

Vascular/Lymphatic: No pathologically enlarged lymph nodes. No
abdominal aortic aneurysm.

Reproductive:  No mass or other significant abnormality.

Other:  None.

Musculoskeletal:  No suspicious bone lesions identified.
IMPRESSION: No evidence of appendicitis or other acute findings.

Tiny nonobstructing left renal calculus.

## 2018-09-02 MED ORDER — IOPAMIDOL (ISOVUE-300) INJECTION 61%
100.0000 mL | Freq: Once | INTRAVENOUS | Status: AC | PRN
  Administered 2018-09-02: 100 mL via INTRAVENOUS

## 2018-09-02 NOTE — Progress Notes (Signed)
Name: Hannah Gregory   MRN: [id number]    DOB: [birth year]   Date:09/02/2018       Progress Note  Subjective  Chief Complaint  Chief Complaint  Patient presents with  . Abdominal Pain    Lower abdominal pain x last night. She felt something pop last night. Constant tightness and soreness. Denies urinary symptoms.  . Nausea    HPI  Patient complaining of of right lower quadrant pain that started suddenly last night. She was turning in bed, felt something pop inside and had sudden pain in RLQ, Pain is 7-8/10 when lying down and improved with standing. Endorses fever, nausea and vomiting started on Saturday. Last emesis yesterday morning- looks like spit. Fevers resolved Sunday morning. Still having nausea and dry heaves. Has been taking tylenol and ibuprofen for fevers- hasnt taken anything for pain.  Last BM: last night, normal Last sexual intercourse: over 9 months ago No dysuria, urinary frequency, diarrhea, vaginal bleeding- states has come off depo and had irregular periods with frequent spotting; this stopped over a week ago. Chronic intermittent white vaginal discharge- no changes in this in the past years.     Patient Active Problem List   Diagnosis Date Noted  . Migraine with aura and without status migrainosus 11/29/2016  . Chronic daily headache 11/29/2016  . Neck pain 11/29/2016  . Scoliosis of thoracolumbar spine 11/29/2016  . Mild depression (HCC) 11/29/2016  . History of anxiety 11/29/2016    Past Medical History:  Diagnosis Date  . Migraine   . Scoliosis     History reviewed. No pertinent surgical history.  Social History   Tobacco Use  . Smoking status: Current Every Day Smoker    Packs/day: 0.50    Years: 18.00    Pack years: 9.00    Types: Cigarettes    Start date: 11/30/1999  . Smokeless tobacco: Never Used  . Tobacco comment: Chantix-made her nausea and unable to tolerate  Substance Use Topics  . Alcohol use: Yes    Comment: occasionally      Current Outpatient Medications:  .  Galcanezumab-gnlm (EMGALITY) 120 MG/ML SOAJ, Inject 120 mg into the skin every 30 (thirty) days., Disp: 1 mL, Rfl: 5 .  topiramate (TOPAMAX) 100 MG tablet, Take 1 tablet (100 mg total) by mouth 2 (two) times daily., Disp: 180 tablet, Rfl: 0 .  venlafaxine XR (EFFEXOR-XR) 75 MG 24 hr capsule, Take 1 capsule (75 mg total) by mouth daily with breakfast., Disp: 90 capsule, Rfl: 1  Allergies  Allergen Reactions  . Imitrex [Sumatriptan] Other (See Comments)    Side effect , chest pain, flushed   . Chantix [Varenicline Tartrate]     nausea  . Prednisone Hives and Other (See Comments)    Numbness of her face     ROS   No other specific complaints in a complete review of systems (except as listed in HPI above).  Objective  Vitals:   09/02/18 0844  BP: 110/72  Pulse: 98  Resp: 16  Temp: 98.2 F (36.8 C)  TempSrc: Oral  SpO2: 99%  Weight: 149 lb 12.8 oz (67.9 kg)  Height: 5\' 5"  (1.651 m)    Body mass index is 24.93 kg/m.  Nursing Note and Vital Signs reviewed.  Physical Exam  Constitutional: She is oriented to person, place, and time. She appears well-developed and well-nourished.  HENT:  Head: Normocephalic and atraumatic.  Mouth/Throat: Oropharynx is clear and moist. No oropharyngeal exudate.  Eyes: Pupils are equal, round,  and reactive to light. EOM are normal.  Cardiovascular: Normal rate and normal heart sounds.  Pulmonary/Chest: Effort normal.  Abdominal: Soft. Normal appearance, normal aorta and bowel sounds are normal. She exhibits no distension, no pulsatile liver and no mass. There is tenderness in the right lower quadrant. There is tenderness at McBurney's point. There is no CVA tenderness and negative Murphy's sign. No hernia.  Neurological: She is alert and oriented to person, place, and time.  Skin: Skin is warm and dry. Capillary refill takes less than 2 seconds. No erythema.  Psychiatric: She has a normal mood and  affect. Her behavior is normal.  Nursing note and vitals reviewed.      No results found for this or any previous visit (from the past 48 hour(s)).  Assessment & Plan  1. Right lower quadrant abdominal pain STAT CT to rule out appendicitis, consider unappreciable hernia/ abdominal muscle strain/ fibroids/ nephrolithiasis ect. Discussed risks and benefits - CT Abdomen Pelvis W Contrast; Future - CBC with Differential; Future - Comprehensive Metabolic Panel (CMET); Future - Urinalysis, Routine w reflex microscopic; Future    -Red flags and when to present for emergency care or RTC including fever >101.74F, sudden improvement new/worsening/un-resolving symptoms,  reviewed with patient at time of visit. Follow up and care instructions discussed and provided in AVS.

## 2018-09-02 NOTE — Telephone Encounter (Signed)
Pt. Reports she was around sick children and Saturday felt nauseous and had a fever.Had some vomiting. No vomiting since Sunday.Still has "a lot of nausea, and last night in bed "felt something pop" in her right lower abdomen. Pain is better when she is standing up. Appointment made for today. No availability with Dr. Carlynn PurlSowles.   Reason for Disposition . [1] MODERATE pain (e.g., interferes with normal activities) AND [2] pain comes and goes (cramps) AND [3] present > 24 hours  (Exception: pain with Vomiting or Diarrhea - see that Guideline)  Answer Assessment - Initial Assessment Questions 1. LOCATION: "Where does it hurt?"      Right lower quadrant of abd. 2. RADIATION: "Does the pain shoot anywhere else?" (e.g., chest, back)     No 3. ONSET: "When did the pain begin?" (e.g., minutes, hours or days ago)      Started last night 4. SUDDEN: "Gradual or sudden onset?"     Sudden 5. PATTERN "Does the pain come and go, or is it constant?"    - If constant: "Is it getting better, staying the same, or worsening?"      (Note: Constant means the pain never goes away completely; most serious pain is constant and it progresses)     - If intermittent: "How long does it last?" "Do you have pain now?"     (Note: Intermittent means the pain goes away completely between bouts)     Worse with movement 6. SEVERITY: "How bad is the pain?"  (e.g., Scale 1-10; mild, moderate, or severe)   - MILD (1-3): doesn't interfere with normal activities, abdomen soft and not tender to touch    - MODERATE (4-7): interferes with normal activities or awakens from sleep, tender to touch    - SEVERE (8-10): excruciating pain, doubled over, unable to do any normal activities         Laying down  7-8  Better when standing 7. RECURRENT SYMPTOM: "Have you ever had this type of abdominal pain before?" If so, ask: "When was the last time?" and "What happened that time?"      No 8. CAUSE: "What do you think is causing the abdominal  pain?"     Unsure 9. RELIEVING/AGGRAVATING FACTORS: "What makes it better or worse?" (e.g., movement, antacids, bowel movement)     Position change 10. OTHER SYMPTOMS: "Has there been any vomiting, diarrhea, constipation, or urine problems?"       Nausea 11. PREGNANCY: "Is there any chance you are pregnant?" "When was your last menstrual period?"       No  Protocols used: ABDOMINAL PAIN - [facility]

## 2018-09-02 NOTE — Patient Instructions (Addendum)
-   Please [place] the medical mall today to get blood work done. You will get a phone call today about getting your CT scan completed today at the [facility] once it has been approved by your insurance.   Appendicitis The appendix is a tube that is shaped like a finger. It is connected to the large intestine. Appendicitis means that this tube is swollen (inflamed). Without treatment, the tube can tear (rupture). This can lead to a life-threatening infection. It can also cause you to have sores (abscesses). These sores hurt. What are the causes? This condition may be caused by something that blocks the appendix, such as:  A ball of poop (stool).  Lymph glands that are bigger than normal.  Sometimes, the cause is not known. What are the signs or symptoms? Symptoms of this condition include:  Pain around the belly button (navel). ? The pain moves toward the lower right belly (abdomen). ? The pain can get worse with time. ? The pain can get worse if you cough. ? The pain can get worse if you move suddenly.  Tenderness in the lower right belly.  Feeling sick to your stomach (nauseous).  Throwing up (vomiting).  Not feeling hungry (loss of appetite).  A fever.  Having a hard time pooping (constipation).  Watery poop (diarrhea).  Not feeling well.  How is this treated? Usually, this condition is treated by taking out the appendix (appendectomy). There are two ways that the appendix can be taken out:  Open surgery. In this surgery, the appendix is taken out through a large cut (incision). The cut is made in the lower right belly. This surgery may be picked if: ? You have scars from another surgery. ? You have a bleeding condition. ? You are pregnant and will be having your baby soon. ? You have a condition that does not allow the other type of surgery.  Laparoscopic surgery. In this surgery, the appendix is taken out through small cuts. Often, this surgery: ? Causes less  pain. ? Causes fewer problems. ? Is easier to heal from.  If your appendix tears and a sore forms:  A drain may be put into the sore. The drain will be used to get rid of fluid.  You may get an antibiotic medicine through an IV tube.  Your appendix may or may not need to be taken out.  This information is not intended to replace advice given to you by your health care provider. Make sure you discuss any questions you have with your health care provider. Document Released: 12/10/2011 Document Revised: 02/23/2016 Document Reviewed: 02/02/2015 Elsevier Interactive Patient Education  [employer]2018 Elsevier Inc.

## 2018-09-02 NOTE — Addendum Note (Signed)
Addended by: Tommie RaymondBOOKER, CRYSTAL L on: 09/02/2018 11:57 AM   Modules accepted: Orders

## 2018-09-03 ENCOUNTER — Other Ambulatory Visit: Payer: Self-pay

## 2018-09-03 ENCOUNTER — Telehealth: Payer: Self-pay

## 2018-09-03 DIAGNOSIS — R82998 Other abnormal findings in urine: Secondary | ICD-10-CM

## 2018-09-03 DIAGNOSIS — N200 Calculus of kidney: Secondary | ICD-10-CM

## 2018-09-03 DIAGNOSIS — N3090 Cystitis, unspecified without hematuria: Secondary | ICD-10-CM

## 2018-09-03 MED ORDER — NITROFURANTOIN MONOHYD MACRO 100 MG PO CAPS: 100.0000 mg | ORAL_CAPSULE | Freq: Two times a day (BID) | ORAL | 0 refills | Status: DC

## 2018-09-03 MED ORDER — TAMSULOSIN HCL 0.4 MG PO CAPS: 0.4000 mg | ORAL_CAPSULE | Freq: Every day | ORAL | 1 refills | Status: DC

## 2018-09-03 MED ORDER — MELOXICAM 7.5 MG PO TABS: 7.5000 mg | ORAL_TABLET | Freq: Every day | ORAL | 0 refills | Status: DC

## 2018-09-03 NOTE — Telephone Encounter (Signed)
Patient called with questions and concerns about the tiny cyst found on the upper poles of both kidneys. She worries about the findings and would like to know if this is anything she should be concerned about. She also has question in regards to the kidney stone being found on the left and she is having pain on the right side. She continues to have pain this morning pain is the same as it was yesterday, 4/10 when standing and 9/10 when sitting or laying. She has hx of kidney stones with UTI. She would like to know if she needs to be on an antibiotic.

## 2018-09-03 NOTE — Telephone Encounter (Signed)
Called patient to discuss CT results and answer all the questions. Will see patient tomorrow.  _____ Crystal- please add on urine culture.

## 2018-09-04 ENCOUNTER — Ambulatory Visit: Payer: Managed Care, Other (non HMO) | Admitting: Nurse Practitioner

## 2018-09-04 LAB — URINE CULTURE
Culture: NO GROWTH
Special Requests: NORMAL

## 2018-09-05 ENCOUNTER — Other Ambulatory Visit: Payer: Self-pay | Admitting: Nurse Practitioner

## 2018-09-05 ENCOUNTER — Telehealth: Payer: Self-pay | Admitting: Nurse Practitioner

## 2018-09-05 MED ORDER — TIZANIDINE HCL 4 MG PO TABS: 4.0000 mg | ORAL_TABLET | Freq: Four times a day (QID) | ORAL | 0 refills | Status: DC | PRN

## 2018-09-05 NOTE — Telephone Encounter (Signed)
Can stop antibiotic. Due to CT scan showing no causes of Right lower quadrant pain, and tenderness to area that is worse with moving abdominal muscles I think it is likely you have an abdominal muscle injury. This can be very painful, for the first few days it is appropriate to rest, no lifting or straining, limit bending, girdle is appropriate. Take meloxicam and tylenol to reduce pain and inflammation, can apply ice to the area for 20 minutes at a time 3-4 times a day. I am additionally sending you a muscle relaxer to assist with muscle stiffness and spasms that typically accompany this pain. This medicine can make you drowsy. Do not drive or operate heavy machinery and do not drink alcohol with this. After the first few days of rest it is recommended to do some physical therapy to help with pain and prevent further stiffness. (crystal place order if willing). If pain worsens or is unmanageable please go to ER.

## 2018-09-05 NOTE — Telephone Encounter (Signed)
Patient cannot afford physical therapy at this time. She will try all the other options.

## 2018-09-05 NOTE — Telephone Encounter (Signed)
-----   Message from Tommie Raymondrystal L Booker, [place] sent at 09/05/2018  9:03 AM EST ----- Patient called.  Patient aware. She wants to know if she should stop taking antibiotics. She missed her appointment yesterday, because she can't afford to miss work. She continues to have abdominal pain in her RLQ. She has been wearing a girdle to put pressure on it so she could go get through her day along with the meloxicam and tylenol.

## 2018-11-12 ENCOUNTER — Telehealth: Payer: Self-pay | Admitting: Family Medicine

## 2018-11-12 DIAGNOSIS — G43109 Migraine with aura, not intractable, without status migrainosus: Secondary | ICD-10-CM

## 2018-11-14 ENCOUNTER — Other Ambulatory Visit: Payer: Self-pay | Admitting: Family Medicine

## 2018-11-14 DIAGNOSIS — G43109 Migraine with aura, not intractable, without status migrainosus: Secondary | ICD-10-CM

## 2018-11-17 NOTE — Telephone Encounter (Signed)
Larita Fife with [facility] calling.  States that they do not see this in their system and need a verbal that it is okay to give medication to pt.  Pt is waiting at pharmacy for medication.  Per FC send message. Larita Fife can be reached at [phone]

## 2018-12-02 ENCOUNTER — Ambulatory Visit: Payer: Self-pay

## 2018-12-02 NOTE — Telephone Encounter (Signed)
    1  Eulene A. Bernhart Female, 38 y.o., 1981-07-25 MRN:  [id number] Phone:  [phone] Judie Petit) PCP:  Alba Cory, MD Primary Cvg:  CIG[employer]/CIGNA MANAGED Next Appt With Internal Medicine 12/11/2018 at 9:40 AM Message from Lorayne Bender sent at 12/02/2018 1:42 PM EST   Summary: flu exposure   Pt calling in: States her son was just diagnosed with Flu (no strand identified). Pediatrician recommended she get preventative Tamiflu. Pt denies symptoms at this time. Pt uses [facility] - [place], [place] - [address] [phone] (Phone) [phone] (Fax)         Call History    Type Contact Phone User  12/02/2018 01:40 PM Phone (Incoming) [place], Hospital doctor A (Self) [phone] Rexene Edison) Lyn Hollingshead, Jenene Slicker

## 2018-12-02 NOTE — Telephone Encounter (Signed)
Best prevention for the flu is the flu vaccine, no longer standard of care to give tamiflu after exposure. We can start treating her if she develops symptoms. Her father on the other hand should get medication if exposure since he is high risk

## 2018-12-03 MED ORDER — OSELTAMIVIR PHOSPHATE 75 MG PO CAPS: 75.0000 mg | ORAL_CAPSULE | Freq: Every day | ORAL | 0 refills | Status: DC

## 2018-12-03 NOTE — Addendum Note (Signed)
Addended by: Alba Cory F on: 12/03/2018 05:10 PM   Modules accepted: Orders

## 2018-12-03 NOTE — Telephone Encounter (Signed)
Patient states she is worried about it since her son with the Flu has been sleeping with her. She just didn't want to get it and have to miss work.

## 2018-12-04 NOTE — Addendum Note (Signed)
Addended by: Cynda Familia on: 12/04/2018 09:34 AM   Modules accepted: Orders

## 2018-12-04 NOTE — Telephone Encounter (Signed)
Patient notified

## 2018-12-11 ENCOUNTER — Ambulatory Visit: Payer: Managed Care, Other (non HMO) | Admitting: Family Medicine

## 2018-12-11 ENCOUNTER — Encounter: Payer: Self-pay | Admitting: Family Medicine

## 2018-12-11 ENCOUNTER — Other Ambulatory Visit (HOSPITAL_COMMUNITY)
Admission: RE | Admit: 2018-12-11 | Discharge: 2018-12-11 | Disposition: A | Discharge status: Home / Self Care | Payer: Managed Care, Other (non HMO) | Source: Ambulatory Visit | Attending: Family Medicine | Admitting: Family Medicine

## 2018-12-11 VITALS — BP 118/68 | HR 107 | Temp 97.7°F | Resp 16 | Ht 65.0 in | Wt 150.5 lb

## 2018-12-11 DIAGNOSIS — G43109 Migraine with aura, not intractable, without status migrainosus: Secondary | ICD-10-CM

## 2018-12-11 DIAGNOSIS — R51 Headache: Secondary | ICD-10-CM

## 2018-12-11 DIAGNOSIS — Z124 Encounter for screening for malignant neoplasm of cervix: Secondary | ICD-10-CM | POA: Diagnosis not present

## 2018-12-11 DIAGNOSIS — N926 Irregular menstruation, unspecified: Secondary | ICD-10-CM

## 2018-12-11 DIAGNOSIS — F32A Depression, unspecified: Secondary | ICD-10-CM

## 2018-12-11 DIAGNOSIS — Z113 Encounter for screening for infections with a predominantly sexual mode of transmission: Secondary | ICD-10-CM

## 2018-12-11 DIAGNOSIS — Z87442 Personal history of urinary calculi: Secondary | ICD-10-CM

## 2018-12-11 DIAGNOSIS — Z30013 Encounter for initial prescription of injectable contraceptive: Secondary | ICD-10-CM

## 2018-12-11 DIAGNOSIS — R519 Headache, unspecified: Secondary | ICD-10-CM

## 2018-12-11 DIAGNOSIS — F320 Major depressive disorder, single episode, mild: Secondary | ICD-10-CM

## 2018-12-11 DIAGNOSIS — F325 Major depressive disorder, single episode, in full remission: Secondary | ICD-10-CM

## 2018-12-11 LAB — POCT URINE PREGNANCY: Preg Test, Ur: NEGATIVE

## 2018-12-11 MED ORDER — MEDROXYPROGESTERONE ACETATE 150 MG/ML IM SUSP: 150.0000 mg | INTRAMUSCULAR | 1 refills | Status: DC

## 2018-12-11 MED ORDER — TOPIRAMATE 100 MG PO TABS: 100.0000 mg | ORAL_TABLET | Freq: Two times a day (BID) | ORAL | 0 refills | Status: DC

## 2018-12-11 MED ORDER — GALCANEZUMAB-GNLM 120 MG/ML ~~LOC~~ SOAJ: 120.0000 mg | SUBCUTANEOUS | 5 refills | Status: DC

## 2018-12-11 MED ORDER — VENLAFAXINE HCL ER 75 MG PO CP24: 75.0000 mg | ORAL_CAPSULE | Freq: Every day | ORAL | 1 refills | Status: DC

## 2018-12-11 NOTE — Progress Notes (Signed)
Name: Hannah Gregory   MRN: [id number]    DOB: 1981-05-31   Date:12/11/2018       Progress Note  Subjective  Chief Complaint  Chief Complaint  Patient presents with  . Migraine  . Contraception    HPI  Major depression in remission : she was seen last September and was  not been feeling sad, but states she is short tempered, felt anger all the time but was worse at work.  She still feels overwhelmed raising three kids by herself and working full time. She denies suicidal thoughts or ideation.She tried Zoloft during pregnancy and also Citalopram in the past  but states did not seem to help. She was started on Effexor 06/2018 and seems to be helping, phq 9 is negative.   Migraine and daily headache: she has a long history of migraines. She was seeing headache center, took sustained released Topamax and had trigger point injections and symptoms improved, but out of medication and having nuchal pain and dull headache. Migraine is described as intense on nuchal area, usually happens during the night, pain is described as pulsating, like her head will explode, associated with nausea, photophobia and phonophobia, it was happening at least  once a week and lasting 24 hours, she has been on Emgality and is doing well, still has some tension headache, but no migraine episodes in months.    Contraception: she has been sexually active with the father of her youngest child for the past 2 months, usually wears condoms however not on the last encounter. LMP is delayed pregnancy today negative. She cannot take estrogen ( OCP ) because of history of migraine with aura and is a smoker. She tried Depo and Mirena, first time worked well, second time Depo causes irregular bleeding and Mirena caused dyspareunia, but she is willing to try Depo again, she will return first couple of days of her cycle  Patient Active Problem List   Diagnosis Date Noted  . Migraine with aura and without status migrainosus 11/29/2016   . Chronic daily headache 11/29/2016  . Neck pain 11/29/2016  . Scoliosis of thoracolumbar spine 11/29/2016  . Mild depression (HCC) 11/29/2016  . History of anxiety 11/29/2016    History reviewed. No pertinent surgical history.  Family History  Problem Relation Age of Onset  . Asthma Mother   . Hypertension Mother   . Hyperlipidemia Mother   . Thyroid disease Mother   . Heart attack Father   . Migraines Father   . Hypertension Father   . Hyperlipidemia Father   . Mental retardation Brother   . COPD Brother   . Hypertension Brother   . Emphysema Brother   . Breast cancer Maternal Grandmother     Social History   Socioeconomic History  . Marital status: Single    Spouse name: Not on file  . Number of children: 3  . Years of education: Not on file  . Highest education level: High school graduate  Occupational History  . Occupation: Associate Professor     Comment: [employer]  Social Needs  . Financial resource strain: Somewhat hard  . Food insecurity:    Worry: Never true    Inability: Never true  . Transportation needs:    Medical: No    Non-medical: No  Tobacco Use  . Smoking status: Current Every Day Smoker    Packs/day: 0.50    Years: 21.00    Pack years: 10.50    Types: Cigarettes  Start date: 10/01/1997  . Smokeless tobacco: Never Used  . Tobacco comment: Chantix-made her nausea and unable to tolerate  Substance and Sexual Activity  . Alcohol use: Yes    Comment: occasionally  . Drug use: No  . Sexual activity: Yes    Partners: Male    Birth control/protection: None    Comment: Wants to discuss BC options  Lifestyle  . Physical activity:    Days per week: 2 days    Minutes per session: 40 min  . Stress: To some extent  Relationships  . Social connections:    Talks on phone: More than three times a week    Gets together: Three times a week    Attends religious service: Never    Active member of club or organization: No    Attends  meetings of clubs or organizations: Never    Relationship status: Never married  . Intimate partner violence:    Fear of current or ex partner: No    Emotionally abused: No    Physically abused: No    Forced sexual activity: No  Other Topics Concern  . Not on file  Social History Narrative   Single mother of 3 children, lives across from her parents     Current Outpatient Medications:  .  Galcanezumab-gnlm (EMGALITY) 120 MG/ML SOAJ, Inject 120 mg into the skin every 30 (thirty) days., Disp: 1 mL, Rfl: 5 .  topiramate (TOPAMAX) 100 MG tablet, Take 1 tablet (100 mg total) by mouth 2 (two) times daily., Disp: 180 tablet, Rfl: 0 .  venlafaxine XR (EFFEXOR-XR) 75 MG 24 hr capsule, Take 1 capsule (75 mg total) by mouth daily with breakfast., Disp: 90 capsule, Rfl: 1  Allergies  Allergen Reactions  . Imitrex [Sumatriptan] Other (See Comments)    Side effect , chest pain, flushed   . Chantix [Varenicline Tartrate]     nausea  . Prednisone Hives and Other (See Comments)    Numbness of her face     I personally reviewed active problem list, medication list, allergies, family history, social history with the patient/caregiver today.   ROS  Constitutional: Negative for fever or weight change.  Respiratory: Negative for cough and shortness of breath.   Cardiovascular: Negative for chest pain or palpitations.  Gastrointestinal: Negative for abdominal pain, no bowel changes.  Musculoskeletal: Negative for gait problem or joint swelling.  Skin: Negative for rash.  Neurological: Negative for dizziness , positive for recurrent headache.  No other specific complaints in a complete review of systems (except as listed in HPI above).   Objective  Vitals:   12/11/18 1015  BP: 118/68  Pulse: (!) 107  Resp: 16  Temp: 97.7 F (36.5 C)  TempSrc: Oral  SpO2: 99%  Weight: 150 lb 8 oz (68.3 kg)  Height: 5\' 5"  (1.651 m)    Body mass index is 25.04 kg/m.  Physical  Exam  Constitutional: Patient appears well-developed and well-nourished.No distress.  HEENT: head atraumatic, normocephalic, pupils equal and reactive to light,  neck supple, throat within normal limits Cardiovascular: Normal rate, regular rhythm and normal heart sounds.  No murmur heard. No BLE edema. Pulmonary/Chest: Effort normal and breath sounds normal. No respiratory distress. Pelvic exam: friable cervix, no discharge, normal bimanual exam, pap smear collected  Abdominal: Soft.  There is no tenderness. Psychiatric: Patient has a normal mood and affect. behavior is normal. Judgment and thought content normal.  PHQ2/9: Depression screen [facility] 2/9 12/11/2018 09/02/2018 06/11/2018 05/07/2018 03/26/2018  Decreased  Interest 0 0 0 0 0  Down, Depressed, Hopeless 0 0 0 0 2  PHQ - 2 Score 0 0 0 0 2  Altered sleeping 1 0 0 1 1  Tired, decreased energy 0 0 0 0 1  Change in appetite 0 0 1 2 0  Feeling bad or failure about yourself  0 0 0 0 1  Trouble concentrating 0 0 0 0 0  Moving slowly or fidgety/restless 0 0 0 0 0  Suicidal thoughts 0 0 0 0 0  PHQ-9 Score 1 0 1 - 5  Difficult doing work/chores Not difficult at all - Not difficult at all Not difficult at all Somewhat difficult   phq 9 normal   Fall Risk: Fall Risk  12/11/2018 09/02/2018 06/11/2018 05/07/2018 03/26/2018  Falls in the past year? 0 0 No No No  Number falls in past yr: 0 - - - -  Injury with Fall? 0 - - - -    Functional Status Survey: Is the patient deaf or have difficulty hearing?: No Does the patient have difficulty seeing, even when wearing glasses/contacts?: Yes Does the patient have difficulty concentrating, remembering, or making decisions?: No Does the patient have difficulty walking or climbing stairs?: No Does the patient have difficulty dressing or bathing?: No Does the patient have difficulty doing errands alone such as visiting a doctor's office or shopping?: No    Assessment & Plan  1. Late menses  - POCT  urine pregnancy negative   2. Cervical cancer screening  - Cytology - PAP  3. Screening examination for STD (sexually transmitted disease)  - Cytology - PAP - RPR - HIV Antibody (routine testing w rflx) - Hepatitis panel, acute  4. Migraine with aura and without status migrainosus, not intractable  - topiramate (TOPAMAX) 100 MG tablet; Take 1 tablet (100 mg total) by mouth 2 (two) times daily.  Dispense: 180 tablet; Refill: 0 - Galcanezumab-gnlm (EMGALITY) 120 MG/ML SOAJ; Inject 120 mg into the skin every 30 (thirty) days.  Dispense: 1 mL; Refill: 5  5. Major depression in remission (HCC)  - venlafaxine XR (EFFEXOR-XR) 75 MG 24 hr capsule; Take 1 capsule (75 mg total) by mouth daily with breakfast.  Dispense: 90 capsule; Refill: 1  6. Chronic daily headache   7. History of kidney stones  Discussed referral to Urologist but she wants to hold off   8. Encounter for initial prescription of injectable contraceptive  - medroxyPROGESTERone (DEPO-PROVERA) 150 MG/ML injection; Inject 1 mL (150 mg total) into the muscle every 3 (three) months.  Dispense: 1 mL; Refill: 1

## 2018-12-15 ENCOUNTER — Encounter: Payer: Self-pay | Admitting: Family Medicine

## 2018-12-15 LAB — CYTOLOGY - PAP
Chlamydia: NEGATIVE
Diagnosis: NEGATIVE
HPV: NOT DETECTED
Neisseria Gonorrhea: NEGATIVE
Trichomonas: NEGATIVE

## 2018-12-15 NOTE — Telephone Encounter (Signed)
Copied from CRM [id number]. Topic: Quick Communication - Other Results (Clinic Use ONLY) >> Dec 15, 2018 10:48 AM Leafy Ro wrote: Pt is calling and had pap test on 12-11-2018 and would like the results

## 2018-12-15 NOTE — Telephone Encounter (Signed)
Pt called about PAP results/ please advise

## 2018-12-16 ENCOUNTER — Encounter: Payer: Self-pay | Admitting: Family Medicine

## 2018-12-17 ENCOUNTER — Ambulatory Visit (INDEPENDENT_AMBULATORY_CARE_PROVIDER_SITE_OTHER): Payer: Managed Care, Other (non HMO)

## 2018-12-17 DIAGNOSIS — Z3042 Encounter for surveillance of injectable contraceptive: Secondary | ICD-10-CM | POA: Diagnosis not present

## 2018-12-17 LAB — POCT URINE PREGNANCY: Preg Test, Ur: NEGATIVE

## 2018-12-17 MED ORDER — MEDROXYPROGESTERONE ACETATE 150 MG/ML IM SUSP
150.0000 mg | INTRAMUSCULAR | Status: DC
  Administered 2018-12-17: 150 mg via INTRAMUSCULAR

## 2018-12-17 NOTE — Progress Notes (Signed)
Patient is here for her depo injection. [facility] She was due for her next injection between 02/28-03/14. She is overdue for her injection. Urine pregnancy test was collected and the results were Negative. Her next injection will be due June 3-June 17. She tolerated injection well and was informed of next due date of June 3.

## 2019-03-06 ENCOUNTER — Ambulatory Visit (INDEPENDENT_AMBULATORY_CARE_PROVIDER_SITE_OTHER): Payer: Managed Care, Other (non HMO)

## 2019-03-06 ENCOUNTER — Other Ambulatory Visit: Payer: Self-pay

## 2019-03-06 DIAGNOSIS — Z3042 Encounter for surveillance of injectable contraceptive: Secondary | ICD-10-CM

## 2019-03-06 NOTE — Progress Notes (Signed)
Date last pap: 12/11/2018  Last Depo-Provera: 12/17/2018  Side Effects if any: No known side effects.  Serum HCG indicated? Not indicated.  Depo-Provera 150 mg IM given by: Santiago Bumpers, CMA  Next appointment due Aug 22- Sept 5.

## 2019-04-02 ENCOUNTER — Ambulatory Visit: Payer: Managed Care, Other (non HMO) | Admitting: Family Medicine

## 2019-05-05 ENCOUNTER — Ambulatory Visit: Payer: Self-pay

## 2019-05-05 ENCOUNTER — Telehealth: Payer: Self-pay

## 2019-05-05 NOTE — Telephone Encounter (Signed)
Patient called stating she has horrible muscle cramps in the back of her calves. She has already tried muscle rub, ice, heat and ibuprofen. She has a appt tomorrow with Dr. Ancil Boozer but wanted to know if she could anything else to help it?

## 2019-05-05 NOTE — Telephone Encounter (Signed)
Incoming call from Patient with complaint of leg pain from her thigh to the beginning to beginning of tth knee.  Rates it mild to severe when touching it.  Questions if it a Pulled muscle.  Has taken Ibuprofen with relief. Reports that if feels like some one is trying to pull something out of it.  States that she was moving furniture around yesterday.  Transferred [facility] to [facility] to schedule appointment.        Reason for Disposition . [1] Thigh or calf pain AND [2] only 1 side AND [3] present > 1 hour  Answer Assessment - Initial Assessment Questions 1. ONSET: "When did the pain start?"      yesterday 2. LOCATION: "Where is the pain located?"     cheek of but down to middle of leg 3. PAIN: "How bad is the pain?"    (Scale 1-10; or mild, moderate, severe)   -  MILD (1-3): doesn't interfere with normal activities    -  MODERATE (4-7): interferes with normal activities (e.g., work or school) or awakens from sleep, limping    -  SEVERE (8-10): excruciating pain, unable to do any normal activities, unable to walk     Mild to severe 4. WORK OR EXERCISE: "Has there been any recent work or exercise that involved this part of the body?"     Moving some furniture around 5. CAUSE: "What do you think is causing the leg pain?"     *No Answer* 6. OTHER SYMPTOMS: "Do you have any other symptoms?" (e.g., chest pain, back pain, breathing difficulty, swelling, rash, fever, numbness, weakness)  cant bare wt. 7. PREGNANCY: "Is there any chance you are pregnant?" "When was your last menstrual period?"    On cycle now.  Protocols used: LEG PAIN-A-AH

## 2019-05-06 ENCOUNTER — Encounter: Payer: Self-pay | Admitting: Family Medicine

## 2019-05-06 ENCOUNTER — Other Ambulatory Visit: Payer: Self-pay

## 2019-05-06 ENCOUNTER — Ambulatory Visit (INDEPENDENT_AMBULATORY_CARE_PROVIDER_SITE_OTHER): Payer: Managed Care, Other (non HMO) | Admitting: Family Medicine

## 2019-05-06 VITALS — BP 120/80 | HR 99 | Temp 97.5°F | Resp 16 | Ht 65.0 in | Wt 152.2 lb

## 2019-05-06 DIAGNOSIS — Z720 Tobacco use: Secondary | ICD-10-CM

## 2019-05-06 DIAGNOSIS — M6289 Other specified disorders of muscle: Secondary | ICD-10-CM | POA: Diagnosis not present

## 2019-05-06 DIAGNOSIS — M25552 Pain in left hip: Secondary | ICD-10-CM | POA: Diagnosis not present

## 2019-05-06 MED ORDER — VARENICLINE TARTRATE 0.5 MG PO TABS: 0.5000 mg | ORAL_TABLET | Freq: Two times a day (BID) | ORAL | 1 refills | Status: DC

## 2019-05-06 MED ORDER — MELOXICAM 15 MG PO TABS: 15.0000 mg | ORAL_TABLET | Freq: Every day | ORAL | 0 refills | Status: DC

## 2019-05-06 MED ORDER — TRAMADOL HCL 50 MG PO TABS: 50.0000 mg | ORAL_TABLET | Freq: Two times a day (BID) | ORAL | 0 refills | Status: AC

## 2019-05-06 NOTE — Telephone Encounter (Signed)
See if you can get her an appointment per Dr. Ancil Boozer.

## 2019-05-06 NOTE — Telephone Encounter (Signed)
lvm for pt to call and schedule an appt °

## 2019-05-06 NOTE — Telephone Encounter (Signed)
See if you can get her an appointment per Dr. Sowles. 

## 2019-05-06 NOTE — Progress Notes (Signed)
Name: Hannah Gregory   MRN: [id number]    DOB: 11-May-1981   Date:05/06/2019       Progress Note  Subjective  Chief Complaint  Chief Complaint  Patient presents with  . Leg Pain    She complains of leg pain x 3 days. Pain is constant and feels like something is pulling her muscle.     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 Gradually got worse and the pain is now worse on left hip, groin to buttocks area. Pain on her groin is sharp like a toothache, tearing like on her left hamstring. No rashes, denies bowel or bladder incontinence. No previous episodes. No recent URI , has not taken prednisone     Patient Active Problem List   Diagnosis Date Noted  . Migraine with aura and without status migrainosus 11/29/2016  . Chronic daily headache 11/29/2016  . Neck pain 11/29/2016  . Scoliosis of thoracolumbar spine 11/29/2016  . Mild depression (HCC) 11/29/2016  . History of anxiety 11/29/2016    History reviewed. No pertinent surgical history.  Family History  Problem Relation Age of Onset  . Asthma Mother   . Hypertension Mother   . Hyperlipidemia Mother   . Thyroid disease Mother   . Heart attack Father   . Migraines Father   . Hypertension Father   . Hyperlipidemia Father   . Mental retardation Brother   . COPD Brother   . Hypertension Brother   . Emphysema Brother   . Breast cancer Maternal Grandmother     Social History   Socioeconomic History  . Marital status: Single    Spouse name: Not on file  . Number of children: 3  . Years of education: Not on file  . Highest education level: High school graduate  Occupational History  . Occupation: Associate Professorharmacy Tech     Comment: [employer]  Social Needs  . Financial resource strain: Somewhat hard  . Food insecurity    Worry: Never true    Inability: Never true  .  Transportation needs    Medical: No    Non-medical: No  Tobacco Use  . Smoking status: Current Every Day Smoker    Packs/day: 0.50    Years: 21.00    Pack years: 10.50    Types: Cigarettes    Start date: 10/01/1997  . Smokeless tobacco: Never Used  . Tobacco comment: Chantix-made her nausea and unable to tolerate  Substance and Sexual Activity  . Alcohol use: Yes    Comment: occasionally  . Drug use: No  . Sexual activity: Yes    Partners: Male    Birth control/protection: None    Comment: Wants to discuss BC options  Lifestyle  . Physical activity    Days per week: 2 days    Minutes per session: 40 min  . Stress: To some extent  Relationships  . Social connections    Talks on phone: More than three times a week    Gets together: Three times a week    Attends religious service: Never    Active member of club or organization: No    Attends meetings of clubs or organizations: Never    Relationship status: Never married  . Intimate partner violence    Fear of current or ex partner: No    Emotionally  abused: No    Physically abused: No    Forced sexual activity: No  Other Topics Concern  . Not on file  Social History Narrative   Single mother of 3 children, lives across from her parents     Current Outpatient Medications:  .  Galcanezumab-gnlm (EMGALITY) 120 MG/ML SOAJ, Inject 120 mg into the skin every 30 (thirty) days., Disp: 1 mL, Rfl: 5 .  medroxyPROGESTERone (DEPO-PROVERA) 150 MG/ML injection, Inject 1 mL (150 mg total) into the muscle every 3 (three) months., Disp: 1 mL, Rfl: 1 .  SAFETY-LOK 3CC SYR 23GX1" 23G X 1" 3 ML MISC, , Disp: , Rfl:  .  topiramate (TOPAMAX) 100 MG tablet, Take 1 tablet (100 mg total) by mouth 2 (two) times daily., Disp: 180 tablet, Rfl: 0 .  venlafaxine XR (EFFEXOR-XR) 75 MG 24 hr capsule, Take 1 capsule (75 mg total) by mouth daily with breakfast., Disp: 90 capsule, Rfl: 1  Current Facility-Administered Medications:  .   medroxyPROGESTERone (DEPO-PROVERA) injection 150 mg, 150 mg, Intramuscular, Q90 days, Alba CorySowles, Stefon Ramthun, MD, 150 mg at 12/17/18 1354  Allergies  Allergen Reactions  . Imitrex [Sumatriptan] Other (See Comments)    Side effect , chest pain, flushed   . Chantix [Varenicline Tartrate]     nausea  . Prednisone Hives and Other (See Comments)    Numbness of her face     I personally reviewed active problem list, medication list, allergies, family history, social history with the patient/caregiver today.   ROS  Ten systems reviewed and is negative except as mentioned in HPI   Objective  Vitals:   05/06/19 0953  BP: 120/80  Pulse: 99  Resp: 16  Temp: (!) 97.5 F (36.4 C)  TempSrc: Temporal  SpO2: 100%  Weight: 152 lb 3.2 oz (69 kg)  Height: 5\' 5"  (1.651 m)    Body mass index is 25.33 kg/m.  Physical Exam  Constitutional: Patient appears well-developed and well-nourished.  No distress.  HEENT: head atraumatic, normocephalic, pupils equal and reactive to light,  neck supple Cardiovascular: Normal rate, regular rhythm and normal heart sounds.  No murmur heard. No BLE edema. Pulmonary/Chest: Effort normal and breath sounds normal. No respiratory distress. Muscular skeletal: pain when bearing weight on left leg, no edema , redness or increase in warmth of left leg, pain is worse with forward bending when standing, but also unable to abduct the left hip. Left hamstring feels tight  Abdominal: Soft.  There is no tenderness. Psychiatric: Patient has a normal mood and affect. behavior is normal. Judgment and thought content normal.  PHQ2/9: [facility] 12/11/2018 09/02/2018 06/11/2018 05/07/2018 03/26/2018  Decreased Interest 0 0 0 0 0  Down, Depressed, Hopeless 0 0 0 0 2  PHQ - 2 Score 0 0 0 0 2  Altered sleeping 1 0 0 1 1  Tired, decreased energy 0 0 0 0 1  Change in appetite 0 0 1 2 0  Feeling bad or failure about yourself  0 0 0 0 1  Trouble concentrating 0 0 0 0 0   Moving slowly or fidgety/restless 0 0 0 0 0  Suicidal thoughts 0 0 0 0 0  PHQ-9 Score 1 0 1 - 5  Difficult doing work/chores Not difficult at all - Not difficult at all Not difficult at all Somewhat difficult    phq 9 is negative   Fall Risk: Fall Risk  05/06/2019 12/11/2018 09/02/2018 06/11/2018 05/07/2018  Falls in the past year? 0 0  0 No No  Number falls in past yr: 0 0 - - -  Injury with Fall? 0 0 - - -     Functional Status Survey: Is the patient deaf or have difficulty hearing?: No Does the patient have difficulty seeing, even when wearing glasses/contacts?: No Does the patient have difficulty concentrating, remembering, or making decisions?: No Does the patient have difficulty walking or climbing stairs?: No Does the patient have difficulty dressing or bathing?: No Does the patient have difficulty doing errands alone such as visiting a doctor's office or shopping?: No    Assessment & Plan  1. Acute hip pain, left  We will refer her to ortho , may need MRI hip  - meloxicam (MOBIC) 15 MG tablet; Take 1 tablet (15 mg total) by mouth daily.  Dispense: 30 tablet; Refill: 0 - traMADol (ULTRAM) 50 MG tablet; Take 1 tablet (50 mg total) by mouth 2 (two) times daily for 5 days.  Dispense: 20 tablet; Refill: 0 - Ambulatory referral to Orthopedic Surgery  2. Hamstring tightness  - meloxicam (MOBIC) 15 MG tablet; Take 1 tablet (15 mg total) by mouth daily.  Dispense: 30 tablet; Refill: 0 - traMADol (ULTRAM) 50 MG tablet; Take 1 tablet (50 mg total) by mouth 2 (two) times daily for 5 days.  Dispense: 20 tablet; Refill: 0 - Ambulatory referral to Orthopedic Surgery  3. Tobacco abuse  - varenicline (CHANTIX) 0.5 MG tablet; Take 1 tablet (0.5 mg total) by mouth 2 (two) times daily.  Dispense: 60 tablet; Refill: 1

## 2019-05-22 ENCOUNTER — Telehealth: Payer: Self-pay | Admitting: Family Medicine

## 2019-05-22 ENCOUNTER — Ambulatory Visit: Payer: Managed Care, Other (non HMO)

## 2019-05-22 DIAGNOSIS — G43109 Migraine with aura, not intractable, without status migrainosus: Secondary | ICD-10-CM

## 2019-05-25 ENCOUNTER — Ambulatory Visit: Payer: Managed Care, Other (non HMO)

## 2019-05-25 ENCOUNTER — Other Ambulatory Visit: Payer: Self-pay

## 2019-05-25 DIAGNOSIS — Z3042 Encounter for surveillance of injectable contraceptive: Secondary | ICD-10-CM

## 2019-05-25 DIAGNOSIS — Z30013 Encounter for initial prescription of injectable contraceptive: Secondary | ICD-10-CM

## 2019-05-25 MED ORDER — MEDROXYPROGESTERONE ACETATE 150 MG/ML IM SUSP: 150.0000 mg | INTRAMUSCULAR | 1 refills | Status: DC

## 2019-05-25 NOTE — Progress Notes (Signed)
Date last pap: 12/11/2018 Last Depo-Provera: 03/06/2019  Side Effects if any: No known side effects  Serum HCG indicated? Not indicated  Depo-Provera 150 mg IM given by: Vonna Kotyk, CMA  Next appointment due November 9-23,2020

## 2019-06-12 ENCOUNTER — Other Ambulatory Visit: Payer: Self-pay

## 2019-06-12 ENCOUNTER — Other Ambulatory Visit (HOSPITAL_COMMUNITY)
Admission: RE | Admit: 2019-06-12 | Discharge: 2019-06-12 | Disposition: A | Discharge status: Home / Self Care | Payer: Managed Care, Other (non HMO) | Source: Ambulatory Visit | Attending: Family Medicine | Admitting: Family Medicine

## 2019-06-12 ENCOUNTER — Ambulatory Visit (INDEPENDENT_AMBULATORY_CARE_PROVIDER_SITE_OTHER): Payer: Managed Care, Other (non HMO) | Admitting: Family Medicine

## 2019-06-12 ENCOUNTER — Encounter: Payer: Self-pay | Admitting: Family Medicine

## 2019-06-12 VITALS — BP 126/72 | HR 81 | Temp 97.5°F | Resp 16 | Ht 65.0 in | Wt 153.2 lb

## 2019-06-12 DIAGNOSIS — Z131 Encounter for screening for diabetes mellitus: Secondary | ICD-10-CM

## 2019-06-12 DIAGNOSIS — N921 Excessive and frequent menstruation with irregular cycle: Secondary | ICD-10-CM | POA: Diagnosis not present

## 2019-06-12 DIAGNOSIS — G43109 Migraine with aura, not intractable, without status migrainosus: Secondary | ICD-10-CM | POA: Diagnosis not present

## 2019-06-12 DIAGNOSIS — Z113 Encounter for screening for infections with a predominantly sexual mode of transmission: Secondary | ICD-10-CM | POA: Diagnosis present

## 2019-06-12 DIAGNOSIS — Z87442 Personal history of urinary calculi: Secondary | ICD-10-CM

## 2019-06-12 DIAGNOSIS — F325 Major depressive disorder, single episode, in full remission: Secondary | ICD-10-CM | POA: Diagnosis not present

## 2019-06-12 DIAGNOSIS — E785 Hyperlipidemia, unspecified: Secondary | ICD-10-CM

## 2019-06-12 DIAGNOSIS — Z720 Tobacco use: Secondary | ICD-10-CM | POA: Diagnosis not present

## 2019-06-12 MED ORDER — VENLAFAXINE HCL ER 75 MG PO CP24: 75.0000 mg | ORAL_CAPSULE | Freq: Every day | ORAL | 1 refills | Status: DC

## 2019-06-12 MED ORDER — EMGALITY 120 MG/ML ~~LOC~~ SOAJ: 120.0000 mg | SUBCUTANEOUS | 5 refills | Status: DC

## 2019-06-12 MED ORDER — TOPIRAMATE 100 MG PO TABS: 100.0000 mg | ORAL_TABLET | Freq: Two times a day (BID) | ORAL | 1 refills | Status: DC

## 2019-06-12 MED ORDER — VARENICLINE TARTRATE 0.5 MG PO TABS: 0.5000 mg | ORAL_TABLET | Freq: Two times a day (BID) | ORAL | 1 refills | Status: DC

## 2019-06-12 NOTE — Progress Notes (Signed)
Name: Hannah Gregory   MRN: [id number]    DOB: [birth year]   Date:06/12/2019       Progress Note  Subjective  Chief Complaint  Chief Complaint  Patient presents with  . Medication Refill    6 month F/U  . Migraine  . Depression  . Menorrhagia    States she has been bleeding ever since her shot-Goes to several tampons a day due to how heavy it is    HPI  Breakthrough bleeding: she states she was on Depo in the past and it caused the same problem, she has been bleeding heavy since last Depo over 2 weeks ago , she has tried IUD twice but it caused pain during intercourse. She cannot take ocp because of history of migraine with aura. She is not sure if done having children so endometrial ablation is not an option. She has been taking ibuprofen prn for cramps but it does not decrease the bleeding. Discussed referring her back to GYN  Major depression in remission : she was seen  September 2019  and was not been feeling sad, but she was having episodes of being  short tempered, felt anger all the time and it was  worse at work. She still feels overwhelmed raising three kids by herselfand working full time.She denies suicidal thoughts or ideation.She tried Zoloft during pregnancy and also Citalopram in the past we gave her  Effexor 06/2018 and seems to be helping, phq 9 is negative.   Migraine and daily headache: she has a long history of migraines. She was seeing headache center, took sustained released Topamax and had trigger point injections and symptoms improved, but out of medication and having nuchal pain and dull headache. Migraine is described as intense on nuchal area, usually happens during the night, pain is described as pulsating, like her head will explode, associated with nausea, photophobia and phonophobia, it was happening at least  once a week and lasting over 24 hours, she has been on Emgality , she states she has migraine episodes once or twice a month and feels like medication  is working well for her. Also takes Topamax BID, no side effects.    Patient Active Problem List   Diagnosis Date Noted  . Migraine with aura and without status migrainosus 11/29/2016  . Chronic daily headache 11/29/2016  . Neck pain 11/29/2016  . Scoliosis of thoracolumbar spine 11/29/2016  . Mild depression (HCC) 11/29/2016  . History of anxiety 11/29/2016    History reviewed. No pertinent surgical history.  Family History  Problem Relation Age of Onset  . Asthma Mother   . Hypertension Mother   . Hyperlipidemia Mother   . Thyroid disease Mother   . Heart attack Father   . Migraines Father   . Hypertension Father   . Hyperlipidemia Father   . Mental retardation Brother   . COPD Brother   . Hypertension Brother   . Emphysema Brother   . Breast cancer Maternal Grandmother     Social History   Socioeconomic History  . Marital status: Single    Spouse name: Not on file  . Number of children: 3  . Years of education: Not on file  . Highest education level: High school graduate  Occupational History  . Occupation: Associate Professorharmacy Tech     Comment: [employer]  Social Needs  . Financial resource strain: Somewhat hard  . Food insecurity    Worry: Never true    Inability: Never true  .  Transportation needs    Medical: No    Non-medical: No  Tobacco Use  . Smoking status: Current Every Day Smoker    Packs/day: 0.50    Years: 22.00    Pack years: 11.00    Types: Cigarettes    Start date: 10/01/1997  . Smokeless tobacco: Never Used  . Tobacco comment: still taking Chantix but the lower dose due to side effects  Substance and Sexual Activity  . Alcohol use: Yes    Comment: occasionally  . Drug use: No  . Sexual activity: Yes    Partners: Male    Birth control/protection: Condom, Injection  Lifestyle  . Physical activity    Days per week: 2 days    Minutes per session: 40 min  . Stress: To some extent  Relationships  . Social connections    Talks on  phone: More than three times a week    Gets together: Three times a week    Attends religious service: Never    Active member of club or organization: No    Attends meetings of clubs or organizations: Never    Relationship status: Never married  . Intimate partner violence    Fear of current or ex partner: No    Emotionally abused: No    Physically abused: No    Forced sexual activity: No  Other Topics Concern  . Not on file  Social History Narrative   Single mother of 3 children, lives across from her parents     Current Outpatient Medications:  .  Galcanezumab-gnlm (EMGALITY) 120 MG/ML SOAJ, Inject 120 mg into the skin every 30 (thirty) days., Disp: 1 mL, Rfl: 5 .  medroxyPROGESTERone (DEPO-PROVERA) 150 MG/ML injection, Inject 1 mL (150 mg total) into the muscle every 3 (three) months., Disp: 1 mL, Rfl: 1 .  SAFETY-LOK 3CC SYR 23GX1" 23G X 1" 3 ML MISC, , Disp: , Rfl:  .  topiramate (TOPAMAX) 100 MG tablet, Take 1 tablet (100 mg total) by mouth 2 (two) times daily., Disp: 180 tablet, Rfl: 1 .  varenicline (CHANTIX) 0.5 MG tablet, Take 1 tablet (0.5 mg total) by mouth 2 (two) times daily., Disp: 60 tablet, Rfl: 1 .  venlafaxine XR (EFFEXOR-XR) 75 MG 24 hr capsule, Take 1 capsule (75 mg total) by mouth daily with breakfast., Disp: 90 capsule, Rfl: 1  Allergies  Allergen Reactions  . Imitrex [Sumatriptan] Other (See Comments)    Side effect , chest pain, flushed   . Chantix [Varenicline Tartrate]     nausea  . Prednisone Hives and Other (See Comments)    Numbness of her face     I personally reviewed active problem list, medication list, allergies, family history, social history, health maintenance with the patient/caregiver today.   ROS  Constitutional: Negative for fever or weight change.  Respiratory: Negative for cough and shortness of breath.   Cardiovascular: Negative for chest pain or palpitations.  Gastrointestinal: Negative for abdominal pain, no bowel changes.   Musculoskeletal: Negative for gait problem or joint swelling.  Skin: Negative for rash.  Neurological: Negative for dizziness, positive for intermittent  headache.  No other specific complaints in a complete review of systems (except as listed in HPI above).  Objective  Vitals:   06/12/19 0909  BP: 126/72  Pulse: 81  Resp: 16  Temp: (!) 97.5 F (36.4 C)  TempSrc: Temporal  SpO2: 99%  Weight: 153 lb 3.2 oz (69.5 kg)  Height: 5\' 5"  (1.651 m)  Body mass index is 25.49 kg/m.  Physical Exam  Constitutional: Patient appears well-developed and well-nourished.  No distress.  HEENT: head atraumatic, normocephalic, pupils equal and reactive to light Cardiovascular: Normal rate, regular rhythm and normal heart sounds.  No murmur heard. No BLE edema. Pulmonary/Chest: Effort normal and breath sounds normal. No respiratory distress. Abdominal: Soft.  There is no tenderness. Psychiatric: Patient has a normal mood and affect. behavior is normal. Judgment and thought content normal.  PHQ2/9: Depression screen [facility] 2/9 06/12/2019 12/11/2018 09/02/2018 06/11/2018 05/07/2018  Decreased Interest 0 0 0 0 0  Down, Depressed, Hopeless 0 0 0 0 0  PHQ - 2 Score 0 0 0 0 0  Altered sleeping 0 1 0 0 1  Tired, decreased energy 0 0 0 0 0  Change in appetite 0 0 0 1 2  Feeling bad or failure about yourself  0 0 0 0 0  Trouble concentrating 0 0 0 0 0  Moving slowly or fidgety/restless 0 0 0 0 0  Suicidal thoughts 0 0 0 0 0  PHQ-9 Score 0 1 0 1 -  Difficult doing work/chores Not difficult at all Not difficult at all - Not difficult at all Not difficult at all    phq 9 is negative   Fall Risk: Fall Risk  06/12/2019 05/06/2019 12/11/2018 09/02/2018 06/11/2018  Falls in the past year? 0 0 0 0 No  Number falls in past yr: 0 0 0 - -  Injury with Fall? 0 0 0 - -     Functional Status Survey: Is the patient deaf or have difficulty hearing?: No Does the patient have difficulty seeing, even when wearing  glasses/contacts?: Yes Does the patient have difficulty concentrating, remembering, or making decisions?: No Does the patient have difficulty walking or climbing stairs?: No Does the patient have difficulty dressing or bathing?: No Does the patient have difficulty doing errands alone such as visiting a doctor's office or shopping?: No    Assessment & Plan  1. Breakthrough bleeding on depo provera  - Ambulatory referral to Obstetrics / Gynecology - COMPLETE METABOLIC PANEL WITH GFR - CBC with Differential/Platelet - TSH  2. Major depression in remission (HCC)  - COMPLETE METABOLIC PANEL WITH GFR - venlafaxine XR (EFFEXOR-XR) 75 MG 24 hr capsule; Take 1 capsule (75 mg total) by mouth daily with breakfast.  Dispense: 90 capsule; Refill: 1  3. Migraine with aura and without status migrainosus, not intractable  - Galcanezumab-gnlm (EMGALITY) 120 MG/ML SOAJ; Inject 120 mg into the skin every 30 (thirty) days.  Dispense: 1 mL; Refill: 5 - topiramate (TOPAMAX) 100 MG tablet; Take 1 tablet (100 mg total) by mouth 2 (two) times daily.  Dispense: 180 tablet; Refill: 1  4. Tobacco abuse  - varenicline (CHANTIX) 0.5 MG tablet; Take 1 tablet (0.5 mg total) by mouth 2 (two) times daily.  Dispense: 60 tablet; Refill: 1  5. History of kidney stones   6. Routine screening for STI (sexually transmitted infection)  - HIV Antibody (routine testing w rflx) - Hepatitis panel, acute - RPR - Cervicovaginal ancillary only  7. Dyslipidemia  - Lipid panel  8. Diabetes mellitus screening  - Hemoglobin A1c

## 2019-06-13 LAB — CERVICOVAGINAL ANCILLARY ONLY
Chlamydia: NEGATIVE
Neisseria Gonorrhea: NEGATIVE

## 2019-06-16 ENCOUNTER — Encounter: Payer: Self-pay | Admitting: Obstetrics and Gynecology

## 2019-06-16 LAB — CBC WITH DIFFERENTIAL/PLATELET
Absolute Monocytes: 490 cells/uL (ref 200–950)
Basophils Absolute: 41 cells/uL (ref 0–200)
Basophils Relative: 0.6 %
Eosinophils Absolute: 27 cells/uL (ref 15–500)
Eosinophils Relative: 0.4 %
HCT: 38.2 % (ref 35.0–45.0)
Hemoglobin: 12.8 g/dL (ref 11.7–15.5)
Lymphs Abs: 1741 cells/uL (ref 850–3900)
MCH: 32.7 pg (ref 27.0–33.0)
MCHC: 33.5 g/dL (ref 32.0–36.0)
MCV: 97.7 fL (ref 80.0–100.0)
MPV: 10 fL (ref 7.5–12.5)
Monocytes Relative: 7.2 %
Neutro Abs: 4502 cells/uL (ref 1500–7800)
Neutrophils Relative %: 66.2 %
Platelets: 293 10*3/uL (ref 140–400)
RBC: 3.91 10*6/uL (ref 3.80–5.10)
RDW: 11.7 % (ref 11.0–15.0)
Total Lymphocyte: 25.6 %
WBC: 6.8 10*3/uL (ref 3.8–10.8)

## 2019-06-16 LAB — COMPLETE METABOLIC PANEL WITH GFR
AG Ratio: 1.9 (calc) (ref 1.0–2.5)
ALT: 7 U/L (ref 6–29)
AST: 10 U/L (ref 10–30)
Albumin: 4.3 g/dL (ref 3.6–5.1)
Alkaline phosphatase (APISO): 84 U/L (ref 31–125)
BUN: 12 mg/dL (ref 7–25)
CO2: 22 mmol/L (ref 20–32)
Calcium: 8.8 mg/dL (ref 8.6–10.2)
Chloride: 115 mmol/L — ABNORMAL HIGH (ref 98–110)
Creat: 0.88 mg/dL (ref 0.50–1.10)
GFR, Est African American: 98 mL/min/{1.73_m2} (ref >=60)
GFR, Est Non African American: 85 mL/min/{1.73_m2} (ref >=60)
Globulin: 2.3 g/dL (calc) (ref 1.9–3.7)
Glucose, Bld: 85 mg/dL (ref 65–99)
Potassium: 4 mmol/L (ref 3.5–5.3)
Sodium: 143 mmol/L (ref 135–146)
Total Bilirubin: 0.6 mg/dL (ref 0.2–1.2)
Total Protein: 6.6 g/dL (ref 6.1–8.1)

## 2019-06-16 LAB — LIPID PANEL
Cholesterol: 164 mg/dL (ref <200)
HDL: 44 mg/dL — ABNORMAL LOW (ref >=50)
LDL Cholesterol (Calc): 103 mg/dL (calc) — ABNORMAL HIGH
Non-HDL Cholesterol (Calc): 120 mg/dL (calc) (ref <130)
Total CHOL/HDL Ratio: 3.7 (calc) (ref <5.0)
Triglycerides: 78 mg/dL (ref <150)

## 2019-06-16 LAB — HEMOGLOBIN A1C
Hgb A1c MFr Bld: 4.9 % of total Hgb (ref <5.7)
Mean Plasma Glucose: 94 (calc)
eAG (mmol/L): 5.2 (calc)

## 2019-06-16 LAB — FLUORESCENT TREPONEMAL AB(FTA)-IGG-BLD: Fluorescent Treponemal ABS: NONREACTIVE

## 2019-06-16 LAB — HEPATITIS PANEL, ACUTE
Hep A IgM: NONREACTIVE
Hep B C IgM: NONREACTIVE
Hepatitis B Surface Ag: NONREACTIVE
Hepatitis C Ab: NONREACTIVE
SIGNAL TO CUT-OFF: 0.03 (ref <1.00)

## 2019-06-16 LAB — HIV ANTIBODY (ROUTINE TESTING W REFLEX): HIV 1&2 Ab, 4th Generation: NONREACTIVE

## 2019-06-16 LAB — RPR: RPR Ser Ql: REACTIVE — AB

## 2019-06-16 LAB — TSH: TSH: 1.19 mIU/L

## 2019-06-16 LAB — RPR TITER: RPR Titer: 1:1 {titer} — ABNORMAL HIGH

## 2019-06-17 ENCOUNTER — Other Ambulatory Visit: Payer: Self-pay

## 2019-06-17 ENCOUNTER — Ambulatory Visit (INDEPENDENT_AMBULATORY_CARE_PROVIDER_SITE_OTHER): Payer: Managed Care, Other (non HMO) | Admitting: Obstetrics & Gynecology

## 2019-06-17 ENCOUNTER — Encounter: Payer: Self-pay | Admitting: Obstetrics & Gynecology

## 2019-06-17 VITALS — BP 130/90 | Ht 65.0 in | Wt 156.0 lb

## 2019-06-17 DIAGNOSIS — N921 Excessive and frequent menstruation with irregular cycle: Secondary | ICD-10-CM | POA: Diagnosis not present

## 2019-06-17 NOTE — Progress Notes (Signed)
Consultant: Dr Carlynn PurlSowles Reason: Heavy irregular bleeding  HPI:      [name] is a 38 y.o. [phone] who LMP was Patient's last menstrual period was 05/26/2019., presents today for a problem visit.  She complains of menometrorrhagia that  began several months ago and its severity is described as severe.  She has irregular bleeding since restarting Depo Provera 7 mos ago; with prior use no periods.  She had been off of hormones for a while (abstinance).  Prior IUD helped w bleeding but had dyspareunia and pelvic pain, and was told she had adhesions when it was removed (the second one);  Cannot take estrogen due to migraine hx and medication use there; also is limited in NSAID use for similar reasons;  Periods are heavy and prolonged and they are associated with moderate  menstrual cramping.  She has used the following for attempts at control: maxi pad and tampon.  Previous evaluation: none. Prior Diagnosis: no prior fibroids or other conditions. Previous Treatment: none.  She is single partner, contraception - Depo-Provera injections.  Hx of STDs: none. She is premenopausal.  PMHx: She  has a past medical history of Migraine and Scoliosis. Also,  has no past surgical history on file., family history includes Asthma in her mother; Breast cancer in her maternal grandmother; COPD in her brother; Emphysema in her brother; Heart attack in her father; Hyperlipidemia in her father and mother; Hypertension in her brother, father, and mother; Mental retardation in her brother; Migraines in her father; Thyroid disease in her mother.,  reports that she has been smoking cigarettes. She started smoking about 21 years ago. She has a 11.00 pack-year smoking history. She has never used smokeless tobacco. She reports current alcohol use. She reports that she does not use drugs.  She  Current Outpatient Medications:  .  Galcanezumab-gnlm (EMGALITY) 120 MG/ML SOAJ, Inject 120 mg into the skin every 30 (thirty)  days., Disp: 1 mL, Rfl: 5 .  medroxyPROGESTERone (DEPO-PROVERA) 150 MG/ML injection, Inject 1 mL (150 mg total) into the muscle every 3 (three) months., Disp: 1 mL, Rfl: 1 .  SAFETY-LOK 3CC SYR 23GX1" 23G X 1" 3 ML MISC, , Disp: , Rfl:  .  topiramate (TOPAMAX) 100 MG tablet, Take 1 tablet (100 mg total) by mouth 2 (two) times daily., Disp: 180 tablet, Rfl: 1 .  varenicline (CHANTIX) 0.5 MG tablet, Take 1 tablet (0.5 mg total) by mouth 2 (two) times daily., Disp: 60 tablet, Rfl: 1 .  venlafaxine XR (EFFEXOR-XR) 75 MG 24 hr capsule, Take 1 capsule (75 mg total) by mouth daily with breakfast., Disp: 90 capsule, Rfl: 1  Also, is allergic to imitrex [sumatriptan]; chantix [varenicline tartrate]; and prednisone.  ROS  Objective: BP 130/90   Ht 5\' 5"  (1.651 m)   Wt 156 lb (70.8 kg)   LMP 05/26/2019 Comment: Has been bleeding every since  BMI 25.96 kg/m  OBGyn Exam  ASSESSMENT/PLAN:  menometrorrhagia  Problem List Items Addressed This Visit      Other   Menometrorrhagia - Primary   Relevant Orders   US PELVIC COMPLETE WITH TRANSVAGINAL    Lysteda option discussed Other options based on US results  Patient has abnormal uterine bleeding . She has a normal exam today, with no evidence of lesions.  Evaluation includes the following: exam, labs such as hormonal testing, and pelvic ultrasound to evaluate for any structural gynecologic abnormalities.  Patient to follow up after testing.  Treatment option for menorrhagia or menometrorrhagia  discussed in great detail with the patient.  Options include hormonal therapy, IUD therapy such as Mirena, D&C, Ablation, and Hysterectomy.  The pros and cons of each option discussed with patient.  Desires to keep fertility option open  Barnett Applebaum, MD, [facility], [facility] 06/17/2019  4:28 PM

## 2019-06-17 NOTE — Patient Instructions (Signed)
Transvaginal Ultrasound A transvaginal ultrasound, also called an endovaginal ultrasound, is a test that uses sound waves to take pictures of the female genital tract. The pictures are taken with a device, called a transducer, that is placed in the vagina. This test may be done to:  Check for problems with your pregnancy.  Check your developing baby.  Check for anything abnormal in the uterus or ovaries.  Find out why you have pelvic pain or bleeding. Tell a health care provider about:  Any allergies you have.  All medicines you are taking, including vitamins, herbs, eye drops, creams, and over-the-counter medicines.  Any blood disorders you have.  Any surgeries you have had.  Any medical conditions you have.  Whether you are pregnant or may be pregnant.  Whether you are having your menstrual period. What are the risks? This is a safe procedure. There are no known risks or complications of having this test. What happens before the procedure? This procedure needs to be done when your bladder is empty. Follow your health care provider's instructions about drinking fluids and emptying your bladder before the test. What happens during the procedure?   You will empty your bladder before the procedure.  You will undress from the waist down.  You will lie down on an exam table, with your knees bent and your feet in foot holders.  A health care provider will cover the transducer with a sterile cover.  A gel will be put on the transducer. The gel helps transmit the sound waves and prevents irritation of your vagina.  The technician will insert the transducer into your vagina to get images. These will be displayed on a monitor that looks like a small television screen.  The transducer will be removed when the procedure is complete. The procedure may vary among health care providers and hospitals. What happens after the procedure?  It is up to you to get the results of your  procedure. Ask your health care provider, or the department that is doing the procedure, when your results will be ready.  Keep all follow-up visits as told by your health care provider. This is important. Summary  A transvaginal ultrasound, also called an endovaginal ultrasound, is a test that uses sound waves to take pictures of the female genital tract.  This is a safe procedure. There are no known risks associated with this test.  The procedure needs to be done when your bladder is empty. Follow your health care provider's instructions about drinking fluids and emptying your bladder before the test.  During the procedure, you will undress from the waist down and lie down on an exam table. A technician will insert a transducer into your vagina to obtain images.  Ask your health care provider, or the department that is doing the procedure, when your results will be ready. This information is not intended to replace advice given to you by your health care provider. Make sure you discuss any questions you have with your health care provider. Document Released: 08/29/2004 Document Revised: 04/30/2018 Document Reviewed: 04/30/2018 Elsevier Patient Education  2020 [employer]  

## 2019-06-23 ENCOUNTER — Other Ambulatory Visit: Payer: Self-pay | Admitting: Obstetrics & Gynecology

## 2019-06-23 ENCOUNTER — Ambulatory Visit (INDEPENDENT_AMBULATORY_CARE_PROVIDER_SITE_OTHER): Payer: Managed Care, Other (non HMO) | Admitting: Obstetrics & Gynecology

## 2019-06-23 ENCOUNTER — Encounter: Payer: Self-pay | Admitting: Obstetrics & Gynecology

## 2019-06-23 ENCOUNTER — Ambulatory Visit (INDEPENDENT_AMBULATORY_CARE_PROVIDER_SITE_OTHER): Payer: Managed Care, Other (non HMO)

## 2019-06-23 ENCOUNTER — Other Ambulatory Visit: Payer: Self-pay

## 2019-06-23 VITALS — BP 120/80 | HR 82 | Ht 65.0 in | Wt 156.0 lb

## 2019-06-23 DIAGNOSIS — N921 Excessive and frequent menstruation with irregular cycle: Secondary | ICD-10-CM

## 2019-06-23 MED ORDER — MEDROXYPROGESTERONE ACETATE 10 MG PO TABS: 10.0000 mg | ORAL_TABLET | Freq: Every day | ORAL | 0 refills | Status: DC

## 2019-06-23 NOTE — Patient Instructions (Signed)
Dilation and Curettage or Vacuum Curettage ° °Dilation and curettage (D&C) and vacuum curettage are minor procedures. A D&C involves stretching (dilation) the cervix and scraping (curettage) the inside lining of the uterus (endometrium). During a D&C, tissue is gently scraped from the endometrium, starting from the top portion of the uterus down to the lowest part of the uterus (cervix). During a vacuum curettage, the lining and tissue in the uterus are removed with the use of gentle suction. °Curettage may be performed to either diagnose or treat a problem. As a diagnostic procedure, curettage is performed to examine tissues from the uterus. A diagnostic curettage may be done if you have: °· Irregular bleeding in the uterus. °· Bleeding with the development of clots. °· Spotting between menstrual periods. °· Prolonged menstrual periods or other abnormal bleeding. °· Bleeding after menopause. °· No menstrual period (amenorrhea). °· A change in size and shape of the uterus. °· Abnormal endometrial cells discovered during a Pap test. °As a treatment procedure, curettage may be performed for the following reasons: °· Removal of an IUD (intrauterine device). °· Removal of retained placenta after giving birth. °· Abortion. °· Miscarriage. °· Removal of endometrial polyps. °· Removal of uncommon types of noncancerous lumps (fibroids). °Tell a health care provider about: °· Any allergies you have, including allergies to prescribed medicine or latex. °· All medicines you are taking, including vitamins, herbs, eye drops, creams, and over-the-counter medicines. This is especially important if you take any blood-thinning medicine. Bring a list of all of your medicines to your appointment. °· Any problems you or family members have had with anesthetic medicines. °· Any blood disorders you have. °· Any surgeries you have had. °· Your medical history and any medical conditions you have. °· Whether you are pregnant or may be  pregnant. °· Recent vaginal infections you have had. °· Recent menstrual periods, bleeding problems you have had, and what form of birth control (contraception) you use. °What are the risks? °Generally, this is a safe procedure. However, problems may occur, including: °· Infection. °· Heavy vaginal bleeding. °· Allergic reactions to medicines. °· Damage to the cervix or other structures or organs. °· Development of scar tissue (adhesions) inside the uterus, which can cause abnormal amounts of menstrual bleeding. This may make it harder to get pregnant in the future. °· A hole (perforation) or puncture in the uterine wall. This is rare. °What happens before the procedure? °Staying hydrated °Follow instructions from your health care provider about hydration, which may include: °· Up to 2 hours before the procedure - you may continue to drink clear liquids, such as water, clear fruit juice, black coffee, and plain tea. °Eating and drinking restrictions °Follow instructions from your health care provider about eating and drinking, which may include: °· 8 hours before the procedure - stop eating heavy meals or foods such as meat, fried foods, or fatty foods. °· 6 hours before the procedure - stop eating light meals or foods, such as toast or cereal. °· 6 hours before the procedure - stop drinking milk or drinks that contain milk. °· 2 hours before the procedure - stop drinking clear liquids. If your health care provider told you to take your medicine(s) on the day of your procedure, take them with only a sip of water. °Medicines °· Ask your health care provider about: °? Changing or stopping your regular medicines. This is especially important if you are taking diabetes medicines or blood thinners. °? Taking medicines such as aspirin   and ibuprofen. These medicines can thin your blood. Do not take these medicines before your procedure if your health care provider instructs you not to. °· You may be given antibiotic  medicine to help prevent infection. °General instructions °· For 24 hours before your procedure, do not: °? Douche. °? Use tampons. °? Use medicines, creams, or suppositories in the vagina. °? Have sexual intercourse. °· You may be given a pregnancy test on the day of the procedure. °· Plan to have someone take you home from the hospital or clinic. °· You may have a blood or urine sample taken. °· If you will be going home right after the procedure, plan to have someone with you for 24 hours. °What happens during the procedure? °· To reduce your risk of infection: °? Your health care team will wash or sanitize their hands. °? Your skin will be washed with soap. °· An IV tube will be inserted into one of your veins. °· You will be given one of the following: °? A medicine that numbs the area in and around the cervix (local anesthetic). °? A medicine to make you fall asleep (general anesthetic). °· You will lie down on your back, with your feet in foot rests (stirrups). °· The size and position of your uterus will be checked. °· A lubricated instrument (speculum or Sims retractor) will be inserted into the back side of your vagina. The speculum will be used to hold apart the walls of your vagina so your health care provider can see your cervix. °· A tool (tenaculum) will be attached to the lip of the cervix to stabilize it. °· Your cervix will be softened and dilated. This may be done by: °? Taking a medicine. °? Having tapered dilators or thin rods (laminaria) or gradual widening instruments (tapered dilators) inserted into your cervix. °· A small, sharp, curved instrument (curette) will be used to scrape a small amount of tissue or cells from the endometrium or cervical canal. In some cases, gentle suction is applied with the curette. The curette will then be removed. The cells will be taken to a lab for testing. °The procedure may vary among health care providers and hospitals. °What happens after the  procedure? °· You may have mild cramping, backache, pain, and light bleeding or spotting. You may pass small blood clots from your vagina. °· You may have to wear compression stockings. These stockings help to prevent blood clots and reduce swelling in your legs. °· Your blood pressure, heart rate, breathing rate, and blood oxygen level will be monitored until the medicines you were given have worn off. °Summary °· Dilation and curettage (D&C) involves stretching (dilation) the cervix and scraping (curettage) the inside lining of the uterus (endometrium). °· After the procedure, you may have mild cramping, backache, pain, and light bleeding or spotting. You may pass small blood clots from your vagina. °· Plan to have someone take you home from the hospital or clinic. °This information is not intended to replace advice given to you by your health care provider. Make sure you discuss any questions you have with your health care provider. °Document Released: 09/17/2005 Document Revised: 08/30/2017 Document Reviewed: 06/03/2016 °Elsevier Patient Education © 2020 [employer] Inc[employer] ° °

## 2019-06-23 NOTE — Progress Notes (Signed)
  HPI: Pt has been having a lot of BTB for the past few mos on Depo, even thoiugh past h/o Depo use was not having these side effects,  Cannot take estrogen due to migraines.    Ultrasound demonstrates no masses seen, some blood in endometrial canal, otherwise normal  PMHx: She  has a past medical history of Migraine and Scoliosis. Also,  has no past surgical history on file., family history includes Asthma in her mother; Breast cancer in her maternal grandmother; COPD in her brother; Emphysema in her brother; Heart attack in her father; Hyperlipidemia in her father and mother; Hypertension in her brother, father, and mother; Mental retardation in her brother; Migraines in her father; Thyroid disease in her mother.,  reports that she has been smoking cigarettes. She started smoking about 21 years ago. She has a 11.00 pack-year smoking history. She has never used smokeless tobacco. She reports current alcohol use. She reports that she does not use drugs.  She has a current medication list which includes the following prescription(s): emgality, medroxyprogesterone, safety-lok 3cc syr 23gx1", topiramate, varenicline, venlafaxine xr, and medroxyprogesterone. Also, is allergic to imitrex [sumatriptan]; chantix [varenicline tartrate]; and prednisone.  Review of Systems  All other systems reviewed and are negative.  Objective: BP 120/80 (BP Location: Right Arm, Patient Position: Sitting, Cuff Size: Normal)   Pulse 82   Ht 5\' 5"  (1.651 m)   Wt 156 lb (70.8 kg)   LMP 05/26/2019 Comment: Has been bleeding every since  BMI 25.96 kg/m   Physical examination Constitutional NAD, Conversant  Skin No rashes, lesions or ulceration.   Extremities: Moves all appropriately.  Normal ROM for age. No lymphadenopathy.  Neuro: Grossly intact  Psych: Oriented to PPT.  Normal mood. Normal affect.   US Pelvis Transvaginal Non-ob (tv Only)  Result Date: 06/23/2019 Patient Name: Hannah Gregory DOB: 30-Apr-1981 MRN:  [id number] ULTRASOUND REPORT Location: [facility] Date of Service: 06/23/2019 Indications:Abnormal Uterine Bleeding Findings: The uterus is retroverted and measures 7.9 x 5.0 x 4.1cm. Echo texture is homogenous without evidence of focal masses. The Endometrium measures 1.7 mm. Filled with complex material. Appears as complex blood products. Right Ovary measures 2.4 x 1.6 x 1.6 cm. It is normal in appearance. Left Ovary measures 3.1 x 1.5 x 1.4 cm. It is normal in appearance. Survey of the adnexa demonstrates no adnexal masses. There is no free fluid in the cul de sac. Impression: 1. Complex material within the endometrial canal. Possible complex blood products. Recommendations: 1.Clinical correlation with the patient's History and Physical Exam. Vita Barley, RT Review of ULTRASOUND.    I have personally reviewed images and report of recent ultrasound done at [facility].    Plan of management to be discussed with patient. Barnett Applebaum, MD, [facility], [facility] 06/23/2019  3:37 PM    Assessment:  Menometrorrhagia - Plan: medroxyPROGESTERone (PROVERA) 10 MG tablet, DISCONTINUED: medroxyPROGESTERone (PROVERA) 10 MG tablet   Plan Provera for 10 days to over lap w continued Depo use Option for [facility] also discussed as next option now or if Provera does not help (would plan hysteroscopy D&C) Other meds less likely to help or have prior SE/risks to patient (Lysteda, Mirena, OCP)  A total of 15 minutes were spent face-to-face with the patient during this encounter and over half of that time dealt with counseling and coordination of care.  Barnett Applebaum, MD, [facility], [facility] 06/23/2019  3:37 PM

## 2019-06-24 ENCOUNTER — Telehealth: Payer: Self-pay

## 2019-06-24 ENCOUNTER — Other Ambulatory Visit: Payer: Self-pay | Admitting: Maternal Newborn

## 2019-06-24 DIAGNOSIS — R112 Nausea with vomiting, unspecified: Secondary | ICD-10-CM

## 2019-06-24 MED ORDER — ONDANSETRON HCL 4 MG PO TABS: 4.0000 mg | ORAL_TABLET | Freq: Three times a day (TID) | ORAL | 0 refills | Status: DC | PRN

## 2019-06-24 NOTE — Telephone Encounter (Signed)
Pt aware.

## 2019-06-24 NOTE — Telephone Encounter (Signed)
I sent a prescription for Zofran to her pharmacy

## 2019-06-24 NOTE — Progress Notes (Signed)
Rx for Zofran sent.

## 2019-06-24 NOTE — Telephone Encounter (Signed)
Pt was seen by [facility] yesterday and he sent in Provera for her. States it is making her extremely nauseas and is requesting something for the nausea. Could you do this since [facility] not in office today please?

## 2019-07-01 ENCOUNTER — Encounter: Payer: Self-pay | Admitting: Obstetrics & Gynecology

## 2019-07-06 ENCOUNTER — Encounter: Payer: Self-pay | Admitting: Obstetrics and Gynecology

## 2019-07-22 ENCOUNTER — Encounter: Payer: Self-pay | Admitting: Obstetrics & Gynecology

## 2019-08-10 ENCOUNTER — Ambulatory Visit: Payer: Managed Care, Other (non HMO)

## 2019-08-10 ENCOUNTER — Other Ambulatory Visit: Payer: Self-pay

## 2019-08-11 ENCOUNTER — Ambulatory Visit (INDEPENDENT_AMBULATORY_CARE_PROVIDER_SITE_OTHER): Payer: Managed Care, Other (non HMO)

## 2019-08-11 DIAGNOSIS — Z3042 Encounter for surveillance of injectable contraceptive: Secondary | ICD-10-CM | POA: Diagnosis not present

## 2019-08-11 MED ORDER — MEDROXYPROGESTERONE ACETATE 150 MG/ML IM SUSP
150.0000 mg | Freq: Once | INTRAMUSCULAR | Status: AC
  Administered 2019-08-11: 150 mg via INTRAMUSCULAR

## 2019-08-31 ENCOUNTER — Other Ambulatory Visit: Payer: Self-pay | Admitting: Family Medicine

## 2019-09-01 ENCOUNTER — Other Ambulatory Visit: Payer: Self-pay | Admitting: Family Medicine

## 2019-09-01 ENCOUNTER — Telehealth: Payer: Self-pay | Admitting: Family Medicine

## 2019-09-01 DIAGNOSIS — G43109 Migraine with aura, not intractable, without status migrainosus: Secondary | ICD-10-CM

## 2019-09-01 MED ORDER — EMGALITY 120 MG/ML ~~LOC~~ SOAJ: 120.0000 mg | SUBCUTANEOUS | 5 refills | Status: DC

## 2019-09-01 NOTE — Telephone Encounter (Signed)
Pharm did not received the request in sept 2020. Pt suppose to have had the inject yesterday

## 2019-09-09 ENCOUNTER — Telehealth: Payer: Self-pay | Admitting: Family Medicine

## 2019-09-09 NOTE — Telephone Encounter (Signed)
She has been taking Ibuprofen and it is not helping. She is taking Emgalitity monthly and it is not helping her migraine this time. She had been having neck pain over the last couple days, then she woke up in the middle of the night with a regular headache. This morning when she got up it was a full migraine with aura and nausea and it is constant.

## 2019-09-09 NOTE — Telephone Encounter (Signed)
Patient experiencing migraine seeking PCP to send in a RX, patient declined going to urgent care today, patient would like a follow up call from PCP nurse

## 2019-09-10 ENCOUNTER — Encounter: Payer: Self-pay | Admitting: Family Medicine

## 2019-09-10 ENCOUNTER — Other Ambulatory Visit: Payer: Self-pay

## 2019-09-10 ENCOUNTER — Ambulatory Visit (INDEPENDENT_AMBULATORY_CARE_PROVIDER_SITE_OTHER): Payer: Managed Care, Other (non HMO) | Admitting: Family Medicine

## 2019-09-10 DIAGNOSIS — G43011 Migraine without aura, intractable, with status migrainosus: Secondary | ICD-10-CM | POA: Diagnosis not present

## 2019-09-10 DIAGNOSIS — J069 Acute upper respiratory infection, unspecified: Secondary | ICD-10-CM | POA: Diagnosis not present

## 2019-09-10 MED ORDER — KETOROLAC TROMETHAMINE 10 MG PO TABS: 10.0000 mg | ORAL_TABLET | Freq: Four times a day (QID) | ORAL | 0 refills | Status: DC | PRN

## 2019-09-10 MED ORDER — UBRELVY 100 MG PO TABS: 1.0000 | ORAL_TABLET | Freq: Every day | ORAL | 0 refills | Status: DC | PRN

## 2019-09-10 MED ORDER — PROMETHAZINE HCL 12.5 MG PO TABS: 12.5000 mg | ORAL_TABLET | Freq: Three times a day (TID) | ORAL | 0 refills | Status: DC | PRN

## 2019-09-10 NOTE — Progress Notes (Addendum)
Name: Hannah Gregory   MRN: [id number]    DOB: 08/27/1981   Date:09/10/2019       Progress Note  Subjective  Chief Complaint  Chief Complaint  Patient presents with  . Sore Throat  . Sinusitis  . Ear Pain  . Migraine    I connected with  Lloyd A Currey  on 09/10/19 at 11:20 AM EST by a video enabled telemedicine application and verified that I am speaking with the correct person using two identifiers.  I discussed the limitations of evaluation and management by telemedicine and the availability of in person appointments. The patient expressed understanding and agreed to proceed. Staff also discussed with the patient that there may be a patient responsible charge related to this service. Patient Location: at home  Provider Location: [facility]   HPI  Migraine Headache: she has a long history of migraine, but states had a severe episode that started on 09/08/2019 in the middle of night, associated with photophobia and nausea. She took ibuprofen but did not work. The pain was throbbing on right side and behind right eye. She cannot tolerate Imitrex ( caused chest tightness ) we will try Ubrelvy and Fioricet to see if we can control symptoms , to take prn Explained since this episode is already severe it may not work, she cannot tolerate prednisone  URI: she states since yesterday she has noticed sore throat, post-nasal drainage, and right ear pain. Taking some otc medication, she denies fever or chills. She had a rapid COVID-19 test this am that per patient was negative. No SOB or wheezing but has a productive cough that also started yesterday    Patient Active Problem List   Diagnosis Date Noted  . Menometrorrhagia 06/17/2019  . Migraine with aura and without status migrainosus 11/29/2016  . Chronic daily headache 11/29/2016  . Neck pain 11/29/2016  . Scoliosis of thoracolumbar spine 11/29/2016  . Mild depression (HCC) 11/29/2016  . History of anxiety 11/29/2016    History reviewed. No pertinent surgical history.  Family History  Problem Relation Age of Onset  . Asthma Mother   . Hypertension Mother   . Hyperlipidemia Mother   . Thyroid disease Mother   . Heart attack Father   . Migraines Father   . Hypertension Father   . Hyperlipidemia Father   . Mental retardation Brother   . COPD Brother   . Hypertension Brother   . Emphysema Brother   . Breast cancer Maternal Grandmother   . Breast cancer Maternal Aunt   . Breast cancer Maternal Aunt     Social History   Socioeconomic History  . Marital status: Single    Spouse name: Not on file  . Number of children: 3  . Years of education: Not on file  . Highest education level: High school graduate  Occupational History  . Occupation: Pharmacy Tech     Comment: [employer]  Tobacco Use  . Smoking status: Current Every Day Smoker    Packs/day: 0.50    Years: 22.00    Pack years: 11.00    Types: Cigarettes    Start date: 10/01/1997  . Smokeless tobacco: Never Used  . Tobacco comment: still taking Chantix but the lower dose due to side effects  Substance and Sexual Activity  . Alcohol use: Yes    Comment: occasionally  . Drug use: No  . Sexual activity: Yes    Partners: Male    Birth control/protection: Condom, Injection  Other Topics Concern  . Not on file  Social History Narrative   Single mother of 3 children, lives across from her parents   Social Determinants of Health   Financial Resource Strain: Medium Risk  . Difficulty of Paying Living Expenses: Somewhat hard  Food Insecurity: No Food Insecurity  . Worried About Programme researcher, broadcasting/film/videounning Out of Food in the Last Year: Never true  . Ran Out of Food in the Last Year: Never true  Transportation Needs: No Transportation Needs  . Lack of Transportation (Medical): No  . Lack of Transportation (Non-Medical): No  Physical Activity: Insufficiently Active  . Days of Exercise per Week: 2 days  . Minutes of Exercise per Session:  40 min  Stress: Stress Concern Present  . Feeling of Stress : To some extent  Social Connections: Moderately Isolated  . Frequency of Communication with Friends and Family: More than three times a week  . Frequency of Social Gatherings with Friends and Family: Three times a week  . Attends Religious Services: Never  . Active Member of Clubs or Organizations: No  . Attends BankerClub or Organization Meetings: Never  . Marital Status: Never married  Intimate Partner Violence: Not At Risk  . Fear of Current or Ex-Partner: No  . Emotionally Abused: No  . Physically Abused: No  . Sexually Abused: No     Current Outpatient Medications:  .  Galcanezumab-gnlm (EMGALITY) 120 MG/ML SOAJ, Inject 120 mg into the skin every 30 (thirty) days., Disp: 1 mL, Rfl: 5 .  medroxyPROGESTERone (DEPO-PROVERA) 150 MG/ML injection, Inject 1 mL (150 mg total) into the muscle every 3 (three) months., Disp: 1 mL, Rfl: 1 .  SAFETY-LOK 3CC SYR 23GX1" 23G X 1" 3 ML MISC, , Disp: , Rfl:  .  topiramate (TOPAMAX) 100 MG tablet, Take 1 tablet (100 mg total) by mouth 2 (two) times daily., Disp: 180 tablet, Rfl: 1 .  venlafaxine XR (EFFEXOR-XR) 75 MG 24 hr capsule, Take 1 capsule (75 mg total) by mouth daily with breakfast., Disp: 90 capsule, Rfl: 1  Allergies  Allergen Reactions  . Imitrex [Sumatriptan] Other (See Comments)    Side effect , chest pain, flushed   . Chantix [Varenicline Tartrate]     nausea  . Prednisone Hives and Other (See Comments)    Numbness of her face     I personally reviewed active problem list, medication list, allergies, family history, social history, health maintenance with the patient/caregiver today.   ROS  Ten systems reviewed and is negative except as mentioned in HPI   Objective  Virtual encounter, vitals not obtained.  There is no height or weight on file to calculate BMI.  Physical Exam  Awake, alert and oriented Looks tired, holding her forehead  PHQ2/9: Depression  screen [facility] 2/9 09/10/2019 06/12/2019 12/11/2018 09/02/2018 06/11/2018  Decreased Interest 0 0 0 0 0  Down, Depressed, Hopeless 0 0 0 0 0  PHQ - 2 Score 0 0 0 0 0  Altered sleeping 0 0 1 0 0  Tired, decreased energy 0 0 0 0 0  Change in appetite 0 0 0 0 1  Feeling bad or failure about yourself  0 0 0 0 0  Trouble concentrating 0 0 0 0 0  Moving slowly or fidgety/restless 0 0 0 0 0  Suicidal thoughts 0 0 0 0 0  PHQ-9 Score 0 0 1 0 1  Difficult doing work/chores - Not difficult at all Not difficult at all - Not difficult at all  PHQ-2/9 Result is negative.    Fall Risk: Fall Risk  06/12/2019 05/06/2019 12/11/2018 09/02/2018 06/11/2018  Falls in the past year? 0 0 0 0 No  Number falls in past yr: 0 0 0 - -  Injury with Fall? 0 0 0 - -    Assessment & Plan  1. Intractable migraine without aura and with status migrainosus  She cannot tolerate triptans - chest pain - we will try Roselyn Meier, for this outbreak since intense we will try ketorolac and promethazine , she is unable to tolerate  prendinsone - Ubrogepant (UBRELVY) 100 MG TABS; Take 1 tablet by mouth daily as needed. May take 2 hours later if no improvement  Dispense: 10 tablet; Refill: 0 - ketorolac (TORADOL) 10 MG tablet; Take 1 tablet (10 mg total) by mouth every 6 (six) hours as needed.  Dispense: 20 tablet; Refill: 0 - promethazine (PHENERGAN) 12.5 MG tablet; Take 1 tablet (12.5 mg total) by mouth every 8 (eight) hours as needed for nausea or vomiting.  Dispense: 20 tablet; Refill: 0  2. Acute URI  Discussed importance of still staying home since rapid test may have been false negative, stay hydrated, rest and take otc medication for cold symptoms, contact us back if no improvement   I discussed the assessment and treatment plan with the patient. The patient was provided an opportunity to ask questions and all were answered. The patient agreed with the plan and demonstrated an understanding of the instructions. Advised to get re-tested  in a few days  The patient was advised to call back or seek an in-person evaluation if the symptoms worsen or if the condition fails to improve as anticipated.  I provided 25 minutes of non-face-to-face time during this encounter.

## 2019-09-14 ENCOUNTER — Telehealth: Payer: Self-pay

## 2019-09-14 NOTE — Telephone Encounter (Signed)
Copied from [place] [id number]. Topic: General - Other >> Sep 14, 2019  9:52 AM Burchel, Abbi R wrote: Reason for CRM: Pt states she is still having pain and has been unable to get Ubrelvy. Please advise. >> Sep 14, 2019  1:26 PM Reyne Dumas L wrote: Pt calling again wanting to know what to do about her headaches as insurance will not cover medication without prior approval.

## 2019-09-15 ENCOUNTER — Telehealth: Payer: Self-pay | Admitting: Family Medicine

## 2019-09-15 NOTE — Telephone Encounter (Signed)
Gave patient codes to [place] discount card. She will call back and let me know if it works. If it doesn't a PA needs to be done.

## 2019-09-15 NOTE — Telephone Encounter (Signed)
Hannah Gregory from [facility] called to check the status of a Prior authorization that was sent to the office on 12.10.20 for Ubrogepant (UBRELVY) 100 MG TABS / please advise

## 2019-09-16 ENCOUNTER — Other Ambulatory Visit: Payer: Self-pay | Admitting: Family Medicine

## 2019-09-16 DIAGNOSIS — G43109 Migraine with aura, not intractable, without status migrainosus: Secondary | ICD-10-CM

## 2019-09-16 NOTE — Telephone Encounter (Signed)
She is willing to see Neurologist. Her headaches have worsened. This week her migraines have been worse she ever had. Could this be due to there weather changing. She can't sleep, lights bother her, neck pain have all come back. Something has triggered her migraines again. She has been taking her medication as prescribed and has not missed any doses.

## 2019-09-16 NOTE — Telephone Encounter (Signed)
She just got the [place] today and if that does not help she will got to the ED.

## 2019-10-13 ENCOUNTER — Other Ambulatory Visit: Payer: Self-pay

## 2019-10-13 DIAGNOSIS — F325 Major depressive disorder, single episode, in full remission: Secondary | ICD-10-CM

## 2019-10-13 MED ORDER — VENLAFAXINE HCL ER 75 MG PO CP24: 75.0000 mg | ORAL_CAPSULE | Freq: Every day | ORAL | 0 refills | Status: DC

## 2019-10-13 NOTE — Telephone Encounter (Signed)
Refill request for general medication. Effexor to [facility]  Last office visit 09/10/2019  Follow up on 12/11/2019

## 2019-10-14 DIAGNOSIS — G43009 Migraine without aura, not intractable, without status migrainosus: Secondary | ICD-10-CM | POA: Insufficient documentation

## 2019-10-15 ENCOUNTER — Other Ambulatory Visit: Payer: Self-pay | Admitting: Acute Care

## 2019-10-15 ENCOUNTER — Other Ambulatory Visit (HOSPITAL_COMMUNITY): Payer: Self-pay | Admitting: Acute Care

## 2019-10-15 DIAGNOSIS — R519 Headache, unspecified: Secondary | ICD-10-CM

## 2019-10-26 ENCOUNTER — Ambulatory Visit
Admission: RE | Admit: 2019-10-26 | Discharge: 2019-10-26 | Disposition: A | Discharge status: Home / Self Care | Payer: Managed Care, Other (non HMO) | Source: Ambulatory Visit | Attending: Acute Care | Admitting: Acute Care

## 2019-10-26 ENCOUNTER — Other Ambulatory Visit: Payer: Self-pay

## 2019-10-26 DIAGNOSIS — R519 Headache, unspecified: Secondary | ICD-10-CM | POA: Diagnosis present

## 2019-10-26 IMAGING — MR MR HEAD W/O CM
12 series · 46 of 48 positions shown · non-contrast
Comparison: None.

CLINICAL DATA: Chronic migraine headaches

EXAM:
MRI HEAD WITHOUT CONTRAST
TECHNIQUE: Multiplanar, multiecho pulse sequences of the brain and surrounding
structures were obtained without intravenous contrast.

[Series 5: ax dwi_tracew · axial · 3.0mm · 0.60mm/px · z∈[-94,+61]mm · 4 of 48 slices shown]
[im 1/48]
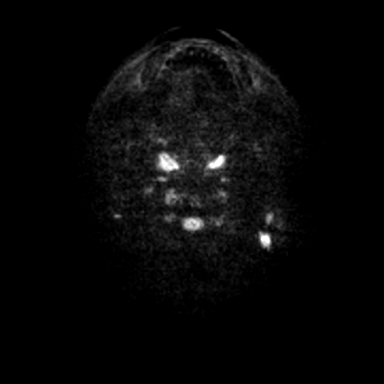
[im 16/48]
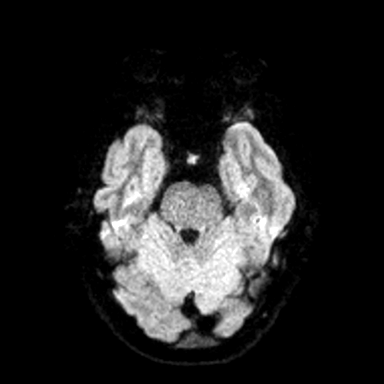
[im 32/48]
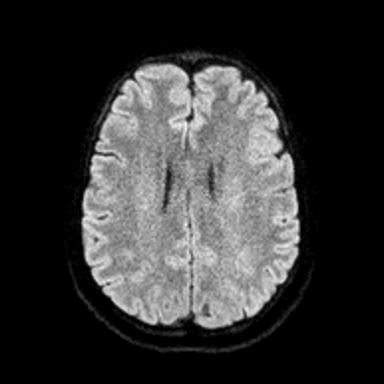
[im 48/48]
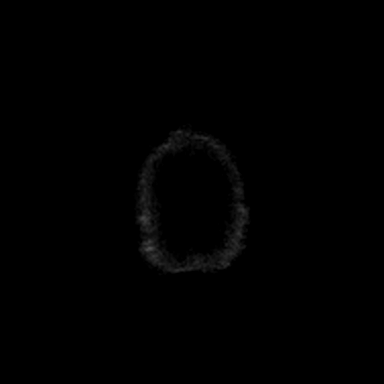

[Series 6: ax dwi_adc · axial · 3.0mm · 0.60mm/px · z∈[-94,+61]mm · 3 of 48 slices shown]
[im 1/48]
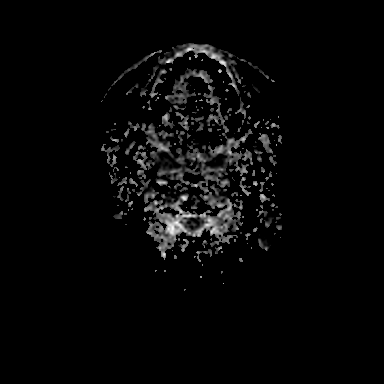
[im 24/48]
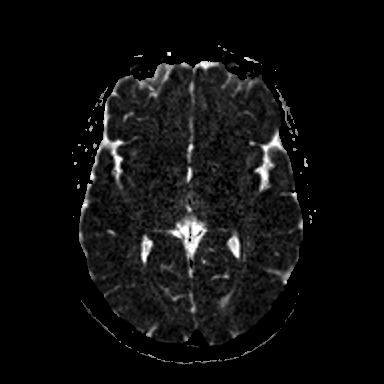
[im 48/48]
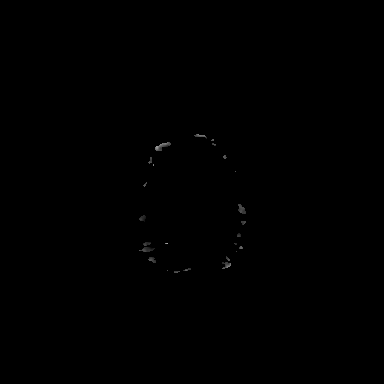

[Series 7: cor dwi_tracew · coronal · 5.0mm · 0.60mm/px · 3 of 38 slices shown]
[im 1/38]
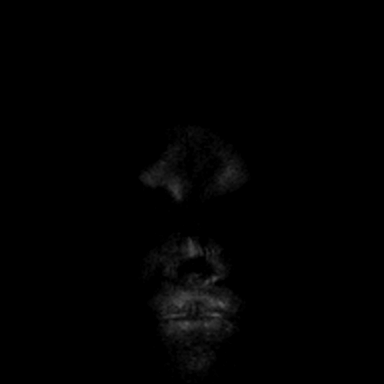
[im 19/38]
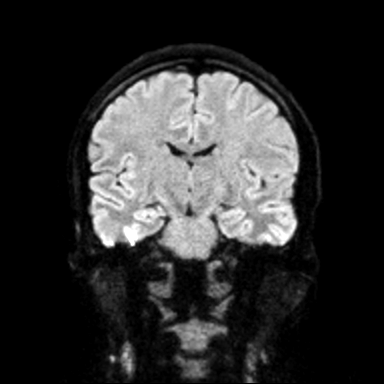
[im 38/38]
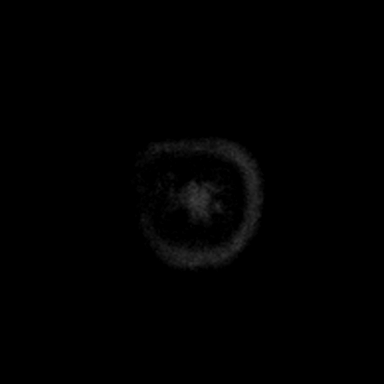

[Series 8: cor dwi_adc · coronal · 5.0mm · 0.60mm/px · 3 of 37 slices shown]
[im 1/37]
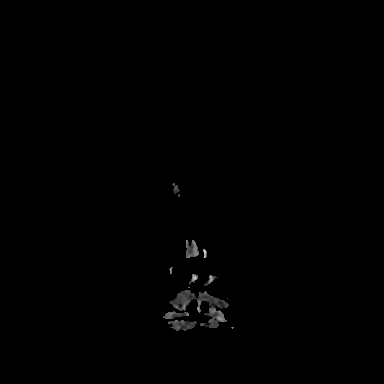
[im 19/37]
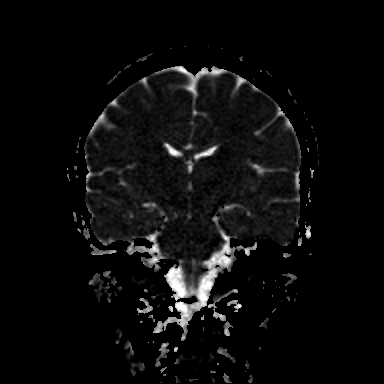
[im 37/37]
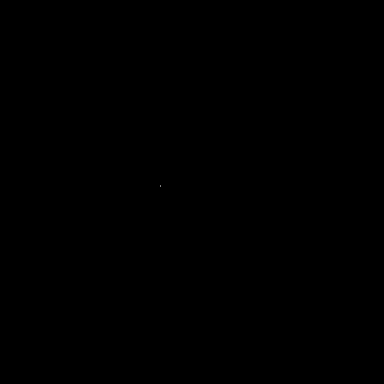

[Series 9: T1 · sagittal · 5.0mm · 0.62mm/px · 2 of 25 slices shown (1 of 2)]
[im 1/25]
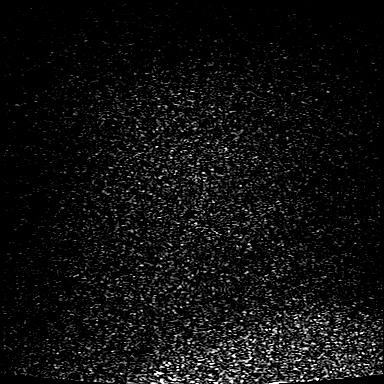
[im 25/25]
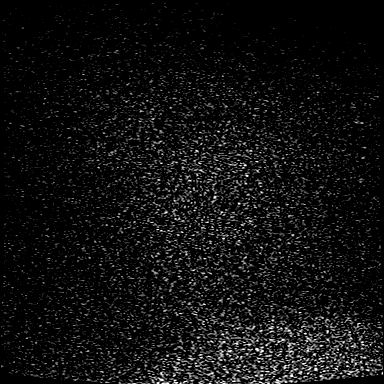

[Series 10: T2 · axial · 5.0mm · 0.53mm/px · z∈[-85,+59]mm · 2 of 25 slices shown (1 of 2)]
[im 1/25]
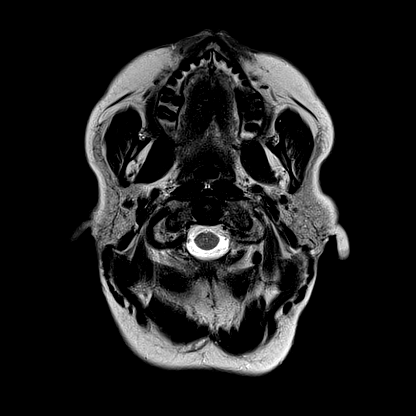
[im 25/25]
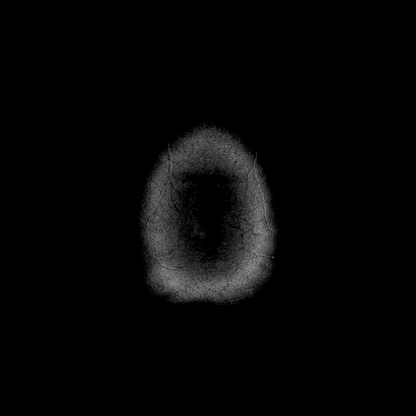

[Series 11: mag_images · axial · 3.0mm · 0.90mm/px · z∈[-102,+75]mm · 4 of 60 slices shown]
[im 1/60]
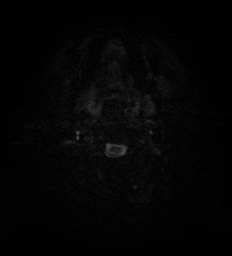
[im 20/60]
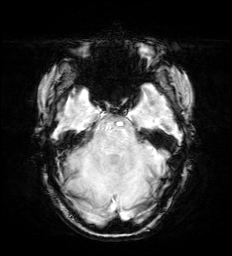
[im 40/60]
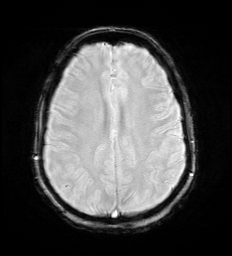
[im 60/60]
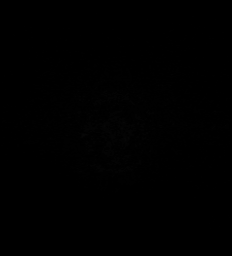

[Series 12: pha_images · axial · 3.0mm · 0.90mm/px · z∈[-102,+72]mm · 4 of 59 slices shown]
[im 1/59]
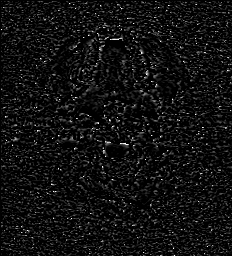
[im 20/59]
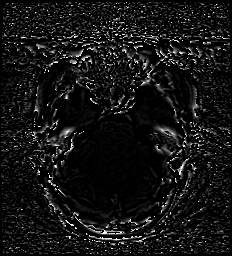
[im 39/59]
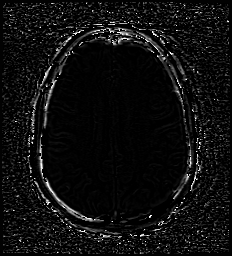
[im 59/59]
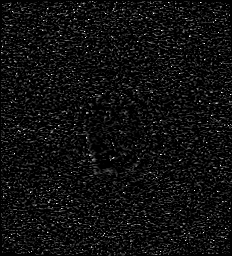

[Series 13: swi_images · axial · 3.0mm · 0.90mm/px · z∈[-102,+75]mm · 4 of 60 slices shown]
[im 1/60]
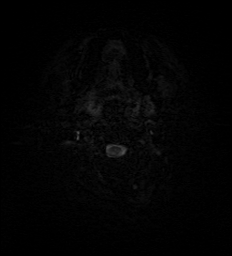
[im 20/60]
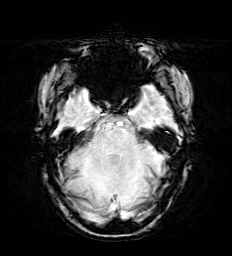
[im 40/60]
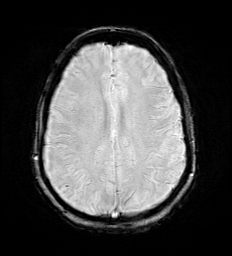
[im 60/60]
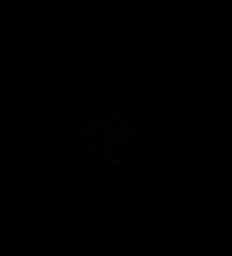

[Series 15: FLAIR · axial · 3.0mm · 0.53mm/px · z∈[-94,+68]mm · 4 of 55 slices shown]
[im 1/55]
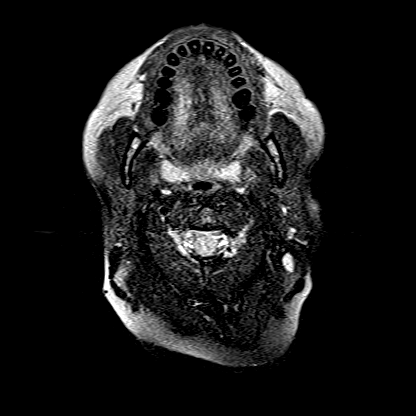
[im 19/55]
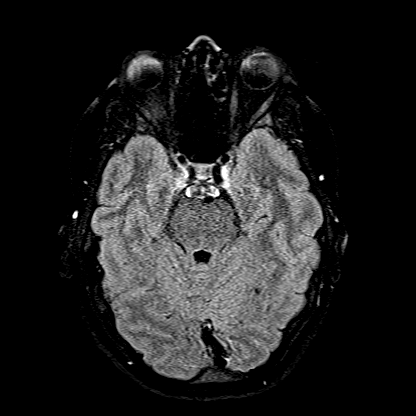
[im 37/55]
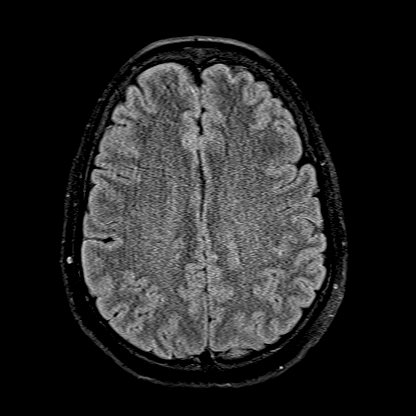
[im 55/55]
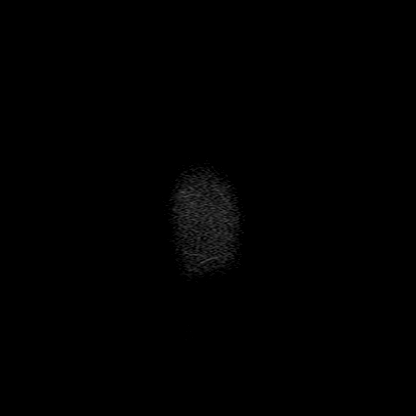

[Series 16: T1 · axial · 1.0mm · 0.98mm/px · z∈[-100,+75]mm · 11 of 176 slices shown (2 of 2)]
[im 1/176]
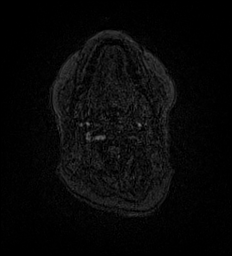
[im 15/176]
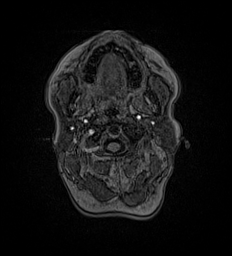
[im 30/176]
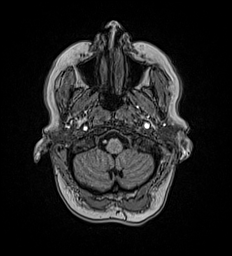
[im 44/176]
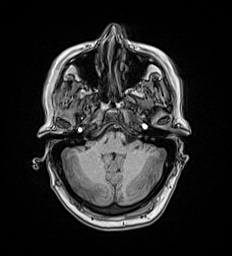
[im 59/176]
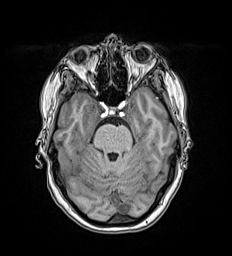
[im 73/176]
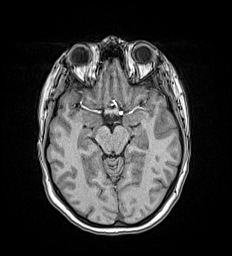
[im 88/176]
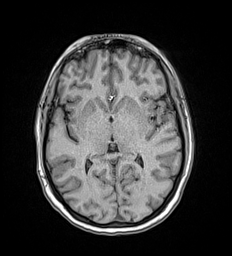
[im 103/176]
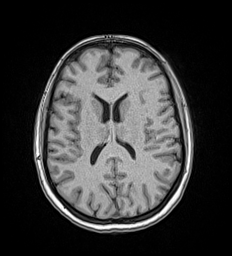
[im 117/176]
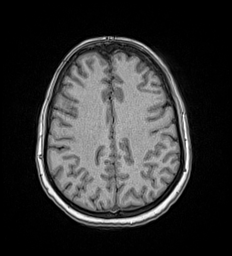
[im 146/176]
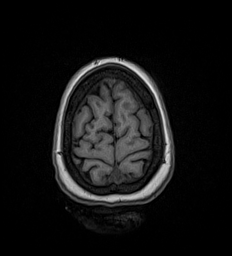
[im 176/176]
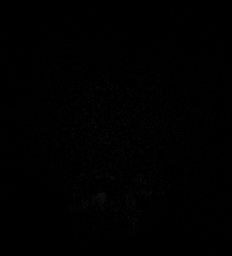

[Series 17: T2 · coronal · 5.0mm · 0.57mm/px · 2 of 29 slices shown (2 of 2)]
[im 1/29]
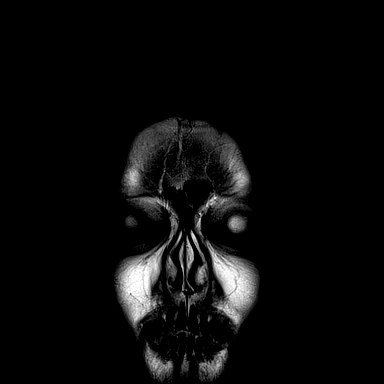
[im 29/29]
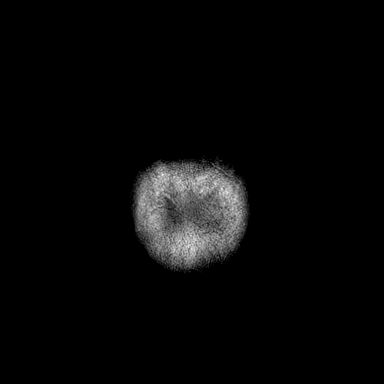

[46 of 48 positions shown; findings below may reference images not displayed]

FINDINGS: Brain: No acute infarction, hemorrhage, hydrocephalus, extra-axial
collection or mass lesion. Normal white matter. Normal brainstem.

Vascular: Normal arterial flow voids

Skull and upper cervical spine: No focal skeletal lesion.

Sinuses/Orbits: Mild mucosal edema maxillary sinus bilaterally. No
air-fluid levels. Negative orbit

Other: None
IMPRESSION: Normal MRI of the brain without contrast

Mild sinus mucosal disease.

## 2019-12-11 ENCOUNTER — Encounter: Payer: Self-pay | Admitting: Family Medicine

## 2019-12-11 ENCOUNTER — Other Ambulatory Visit: Payer: Self-pay

## 2019-12-11 ENCOUNTER — Ambulatory Visit (INDEPENDENT_AMBULATORY_CARE_PROVIDER_SITE_OTHER): Payer: Managed Care, Other (non HMO) | Admitting: Family Medicine

## 2019-12-11 VITALS — BP 118/78 | HR 100 | Temp 97.3°F | Resp 16 | Ht 65.0 in | Wt 159.0 lb

## 2019-12-11 DIAGNOSIS — Z832 Family history of diseases of the blood and blood-forming organs and certain disorders involving the immune mechanism: Secondary | ICD-10-CM

## 2019-12-11 DIAGNOSIS — E785 Hyperlipidemia, unspecified: Secondary | ICD-10-CM

## 2019-12-11 DIAGNOSIS — G43109 Migraine with aura, not intractable, without status migrainosus: Secondary | ICD-10-CM

## 2019-12-11 DIAGNOSIS — E538 Deficiency of other specified B group vitamins: Secondary | ICD-10-CM | POA: Insufficient documentation

## 2019-12-11 DIAGNOSIS — E559 Vitamin D deficiency, unspecified: Secondary | ICD-10-CM | POA: Insufficient documentation

## 2019-12-11 DIAGNOSIS — Z720 Tobacco use: Secondary | ICD-10-CM | POA: Diagnosis not present

## 2019-12-11 DIAGNOSIS — F325 Major depressive disorder, single episode, in full remission: Secondary | ICD-10-CM | POA: Diagnosis not present

## 2019-12-11 DIAGNOSIS — N921 Excessive and frequent menstruation with irregular cycle: Secondary | ICD-10-CM

## 2019-12-11 MED ORDER — TOPIRAMATE 50 MG PO TABS: 50.0000 mg | ORAL_TABLET | Freq: Two times a day (BID) | ORAL | 0 refills | Status: DC

## 2019-12-11 MED ORDER — CYANOCOBALAMIN 1000 MCG/ML IJ SOLN: 1000.0000 ug | INTRAMUSCULAR | 5 refills | Status: DC

## 2019-12-11 MED ORDER — VENLAFAXINE HCL ER 75 MG PO CP24: 75.0000 mg | ORAL_CAPSULE | Freq: Every day | ORAL | 1 refills | Status: DC

## 2019-12-11 NOTE — Progress Notes (Signed)
Name: Hannah Gregory   MRN: [id number]    DOB: 08/07/1981   Date:12/11/2019       Progress Note  Subjective  Chief Complaint  Chief Complaint  Patient presents with  . Medication Refill    6 month F/U  . Migraine    Neurologist changed her to [place] and has helped  . Depression    HPI  Major depression in remission : she was seen September 2019  and was not been feeling sad, but states she was very  short tempered, felt anger all the time but was worse at work. She still feels overwhelmed raising three kids by herselfand working full time.She denies suicidal thoughts or ideation.She tried Zoloft during pregnancy and also Citalopram in the past  but states did not seem to help. She was started on Effexor 06/2018 and seems to be helping, phq 9 has been negative lately.    Migraine Headache: she has a long history of migraine, but states had a severe episode that started on 09/08/2019 in the middle of night, associated with photophobia and nausea. She states her typical symptoms are - aura with scotomas - followed by severe headache associated with nausea, sometimes vomiting and photophobia and phonophobia. She cannot tolerate Imitrex ( caused chest tightness ). We referred her to neurologist and had a negative MRI  10/26/2019, she was switched from [facility] to [place] back in January and migraine frequency has improved significantly. She missed a lot of days at work in January and no episodes of migraines since Feb 2021 She still has daily dull headaches and is winning off Topamax as recommended by neurologist , she needs a refill of medication today  Menometrorrhagia: she was having break through bleeding. Seen by Dr. Kenton Kingfisher, she skipped last dose of Depo and is currently on her cycle, she is trying to have a good cycle before taking another depo. Discussed prevention of pregnancy, she will re-consider discussing D&C with Dr. Kenton Kingfisher, but she is afraid of anesthesia   Vitamin B12 deficiency: she  is now getting B12 injections, advised to switch to SL B12 and because of family history of Pernicious anemia, may need to continue B12 injections monthly   Vitamin D deficiency: she is currently taking 1000 ui otc daily, advised to go up to 2000 units daily   Patient Active Problem List   Diagnosis Date Noted  . Menometrorrhagia 06/17/2019  . Migraine with aura and without status migrainosus 11/29/2016  . Chronic daily headache 11/29/2016  . Neck pain 11/29/2016  . Scoliosis of thoracolumbar spine 11/29/2016  . Mild depression ([place]) 11/29/2016  . History of anxiety 11/29/2016    History reviewed. No pertinent surgical history.  Family History  Problem Relation Age of Onset  . Asthma Mother   . Hypertension Mother   . Hyperlipidemia Mother   . Thyroid disease Mother   . Heart attack Father   . Migraines Father   . Hypertension Father   . Hyperlipidemia Father   . Mental retardation Brother   . COPD Brother   . Hypertension Brother   . Emphysema Brother   . Breast cancer Maternal Grandmother   . Breast cancer Maternal Aunt   . Breast cancer Maternal Aunt     Social History   Tobacco Use  . Smoking status: Current Every Day Smoker    Packs/day: 0.25    Years: 22.00    Pack years: 5.50    Types: Cigarettes    Start date: 10/01/1997  .  Smokeless tobacco: Never Used  Substance Use Topics  . Alcohol use: Yes    Comment: occasionally     Current Outpatient Medications:  .  ascorbic acid (VITAMIN C) 500 MG tablet, Take by mouth., Disp: , Rfl:  .  cholecalciferol (VITAMIN D3) 25 MCG (1000 UNIT) tablet, Take 1,000 Units by mouth daily., Disp: , Rfl:  .  cyanocobalamin (,VITAMIN B-12,) 1000 MCG/ML injection, Take 3m once a week for four weeks., Disp: , Rfl:  .  Fremanezumab-vfrm (AJOVY) 225 MG/1.5ML SOAJ, Inject into the skin., Disp: , Rfl:  .  Lysine HCl 500 MG TABS, Take 1 tablet by mouth daily., Disp: , Rfl:  .  medroxyPROGESTERone (DEPO-PROVERA) 150 MG/ML injection,  Inject 1 mL (150 mg total) into the muscle every 3 (three) months., Disp: 1 mL, Rfl: 1 .  Nicotine 21-14-7 MG/24HR KIT, Place onto the skin., Disp: , Rfl:  .  SAFETY-LOK 3CC SYR 23GX1" 23G X 1" 3 ML MISC, , Disp: , Rfl:  .  topiramate (TOPAMAX) 50 MG tablet, Take 1 tablet (50 mg total) by mouth 2 (two) times daily., Disp: 90 tablet, Rfl: 0 .  Ubrogepant (UBRELVY) 100 MG TABS, Take 1 tablet by mouth daily as needed. May take 2 hours later if no improvement, Disp: 10 tablet, Rfl: 0 .  venlafaxine XR (EFFEXOR-XR) 75 MG 24 hr capsule, Take 1 capsule (75 mg total) by mouth daily with breakfast., Disp: 90 capsule, Rfl: 1 .  vitamin B-12 (CYANOCOBALAMIN) 1000 MCG tablet, Take 1,000 mcg by mouth daily., Disp: , Rfl:  .  zinc sulfate (ZINC-220) 220 (50 Zn) MG capsule, Take 1 capsule by mouth daily., Disp: , Rfl:  .  promethazine (PHENERGAN) 12.5 MG tablet, Take 1 tablet (12.5 mg total) by mouth every 8 (eight) hours as needed for nausea or vomiting. (Patient not taking: Reported on 12/11/2019), Disp: 20 tablet, Rfl: 0  Allergies  Allergen Reactions  . Imitrex [Sumatriptan] Other (See Comments)    Side effect , chest pain, flushed   . Chantix [Varenicline Tartrate]     nausea  . Prednisone Hives and Other (See Comments)    Numbness of her face     I personally reviewed active problem list, medication list, allergies, family history, social history, health maintenance with the patient/caregiver today.   ROS  Constitutional: Negative for fever , positive for mild  weight change.  Respiratory: Negative for cough and shortness of breath.   Cardiovascular: Negative for chest pain or palpitations.  Gastrointestinal: Negative for abdominal pain, no bowel changes.  Musculoskeletal: Negative for gait problem or joint swelling.  Skin: Negative for rash.  Neurological: Negative for dizziness or headache.  No other specific complaints in a complete review of systems (except as listed in HPI  above).  Objective  Vitals:   12/11/19 0853  BP: 118/78  Pulse: 100  Resp: 16  Temp: (!) 97.3 F (36.3 C)  TempSrc: Temporal  SpO2: 100%  Weight: 159 lb (72.1 kg)  Height: 5' 5" (1.651 m)    Body mass index is 26.46 kg/m.  Physical Exam  Constitutional: Patient appears well-developed and well-nourished. Overweight. No distress.  HEENT: head atraumatic, normocephalic, pupils equal and reactive to light Cardiovascular: Normal rate, regular rhythm and normal heart sounds.  No murmur heard. No BLE edema. Pulmonary/Chest: Effort normal and breath sounds normal. No respiratory distress. Abdominal: Soft.  There is no tenderness. Psychiatric: Patient has a normal mood and affect. behavior is normal. Judgment and thought content normal.  PHQ2/9:  Depression screen [facility] 2/9 12/11/2019 09/10/2019 06/12/2019 12/11/2018 09/02/2018  Decreased Interest 0 0 0 0 0  Down, Depressed, Hopeless 0 0 0 0 0  PHQ - 2 Score 0 0 0 0 0  Altered sleeping 0 0 0 1 0  Tired, decreased energy 0 0 0 0 0  Change in appetite 0 0 0 0 0  Feeling bad or failure about yourself  0 0 0 0 0  Trouble concentrating 0 0 0 0 0  Moving slowly or fidgety/restless 0 0 0 0 0  Suicidal thoughts 0 0 0 0 0  PHQ-9 Score 0 0 0 1 0  Difficult doing work/chores Not difficult at all - Not difficult at all Not difficult at all -    phq 9 is  negative   Fall Risk: Fall Risk  12/11/2019 06/12/2019 05/06/2019 12/11/2018 09/02/2018  Falls in the past year? 0 0 0 0 0  Number falls in past yr: 0 0 0 0 -  Injury with Fall? 0 0 0 0 -      Assessment & Plan  1. Migraine with aura and without status migrainosus, not intractable  - topiramate (TOPAMAX) 50 MG tablet; Take 1 tablet (50 mg total) by mouth 2 (two) times daily.  Dispense: 90 tablet; Refill: 0  2. Major depression in remission (HCC)  - venlafaxine XR (EFFEXOR-XR) 75 MG 24 hr capsule; Take 1 capsule (75 mg total) by mouth daily with breakfast.  Dispense: 90 capsule; Refill:  1  3. Tobacco abuse  Using nicotine   4. Dyslipidemia  Discussed life style modification , last level improved   5. Menometrorrhagia  Discussed importance of follow with Dr. Kenton Kingfisher    6. B12 deficiency  Continue B12 injections  7. Vitamin D deficiency  Increase vitamin D 2000 units daily   8. Family history of pernicious anemia  Stay on B12 injection

## 2019-12-14 ENCOUNTER — Other Ambulatory Visit: Payer: Self-pay

## 2019-12-14 ENCOUNTER — Ambulatory Visit: Payer: Managed Care, Other (non HMO) | Admitting: Internal Medicine

## 2019-12-14 ENCOUNTER — Encounter: Payer: Self-pay | Admitting: Internal Medicine

## 2019-12-14 VITALS — BP 108/72 | HR 81 | Temp 97.5°F | Resp 16 | Ht 65.0 in | Wt 160.0 lb

## 2019-12-14 DIAGNOSIS — J310 Chronic rhinitis: Secondary | ICD-10-CM | POA: Diagnosis not present

## 2019-12-14 DIAGNOSIS — H66001 Acute suppurative otitis media without spontaneous rupture of ear drum, right ear: Secondary | ICD-10-CM

## 2019-12-14 DIAGNOSIS — F17200 Nicotine dependence, unspecified, uncomplicated: Secondary | ICD-10-CM | POA: Diagnosis not present

## 2019-12-14 MED ORDER — AMOXICILLIN-POT CLAVULANATE 875-125 MG PO TABS: 1.0000 | ORAL_TABLET | Freq: Two times a day (BID) | ORAL | 0 refills | Status: DC

## 2019-12-14 NOTE — Progress Notes (Signed)
Patient ID: Hannah Gregory, female    DOB: 08/20/1981, 39 y.o.   MRN: [id number]  PCP: Steele Sizer, MD  Chief Complaint  Patient presents with  . Ear Pain  . Ear Drainage    Subjective:   Hannah Gregory is a 39 y.o. female, presents to clinic with CC of the following:  Chief Complaint  Patient presents with  . Ear Pain  . Ear Drainage    HPI:  Patient is a 39 year old female who just saw Dr. Ancil Boozer on 12/11/2019 with that note reviewed. He presents today with right ear pain.  Her symptoms started this past Saturday, with today having increased pain, and some minimal drainage from the right ear.  Not bloody, she thinks looked more like wax.  No fevers, no decreased hearing.  No tinnitus.  She states she has mucus all the time with chronic rhinitis symptoms, takes an antihistamine virtually year-round, and notes no increased mucus in the past couple days.  She occasionally takes a Mucinex product as needed with the mucus as well.  Starting to feel some pain beneath her right ear and the lymph node area.  Her left ear may be a little sensitive, but not painful.  She states she has had many ear infections in her past, treated with Augmentin and doxycycline products previously when asked. Denies any allergies to antibiotics when asked. She does continue to smoke, is trying to quit presently.  She has tried Chantix before to help and notes that it made her nauseous.  Patient Active Problem List   Diagnosis Date Noted  . B12 deficiency 12/11/2019  . Vitamin D deficiency 12/11/2019  . Family history of pernicious anemia 12/11/2019  . Menometrorrhagia 06/17/2019  . Migraine with aura and without status migrainosus 11/29/2016  . Chronic daily headache 11/29/2016  . Neck pain 11/29/2016  . Scoliosis of thoracolumbar spine 11/29/2016  . Mild depression ([place]) 11/29/2016  . History of anxiety 11/29/2016      Current Outpatient Medications:  .  ascorbic acid (VITAMIN C) 500 MG  tablet, Take by mouth., Disp: , Rfl:  .  cholecalciferol (VITAMIN D3) 25 MCG (1000 UNIT) tablet, Take 1,000 Units by mouth daily., Disp: , Rfl:  .  cyanocobalamin (,VITAMIN B-12,) 1000 MCG/ML injection, Inject 1 mL (1,000 mcg total) into the muscle every 30 (thirty) days., Disp: 1 mL, Rfl: 5 .  Fremanezumab-vfrm (AJOVY) 225 MG/1.5ML SOAJ, Inject into the skin., Disp: , Rfl:  .  Lysine HCl 500 MG TABS, Take 1 tablet by mouth daily., Disp: , Rfl:  .  medroxyPROGESTERone (DEPO-PROVERA) 150 MG/ML injection, Inject 1 mL (150 mg total) into the muscle every 3 (three) months., Disp: 1 mL, Rfl: 1 .  Nicotine 21-14-7 MG/24HR KIT, Place onto the skin., Disp: , Rfl:  .  promethazine (PHENERGAN) 12.5 MG tablet, Take 1 tablet (12.5 mg total) by mouth every 8 (eight) hours as needed for nausea or vomiting., Disp: 20 tablet, Rfl: 0 .  SAFETY-LOK 3CC SYR 23GX1" 23G X 1" 3 ML MISC, , Disp: , Rfl:  .  topiramate (TOPAMAX) 50 MG tablet, Take 1 tablet (50 mg total) by mouth 2 (two) times daily., Disp: 90 tablet, Rfl: 0 .  Ubrogepant (UBRELVY) 100 MG TABS, Take 1 tablet by mouth daily as needed. May take 2 hours later if no improvement, Disp: 10 tablet, Rfl: 0 .  venlafaxine XR (EFFEXOR-XR) 75 MG 24 hr capsule, Take 1 capsule (75 mg total) by mouth daily with breakfast.,  Disp: 90 capsule, Rfl: 1 .  vitamin B-12 (CYANOCOBALAMIN) 1000 MCG tablet, Take 1,000 mcg by mouth daily., Disp: , Rfl:  .  zinc sulfate (ZINC-220) 220 (50 Zn) MG capsule, Take 1 capsule by mouth daily., Disp: , Rfl:    Allergies  Allergen Reactions  . Imitrex [Sumatriptan] Other (See Comments)    Side effect , chest pain, flushed   . Chantix [Varenicline Tartrate]     nausea  . Prednisone Hives and Other (See Comments)    Numbness of her face      History reviewed. No pertinent surgical history.   Family History  Problem Relation Age of Onset  . Asthma Mother   . Hypertension Mother   . Hyperlipidemia Mother   . Thyroid disease  Mother   . Heart attack Father   . Migraines Father   . Hypertension Father   . Hyperlipidemia Father   . Mental retardation Brother   . COPD Brother   . Hypertension Brother   . Emphysema Brother   . Breast cancer Maternal Grandmother   . Breast cancer Maternal Aunt   . Breast cancer Maternal Aunt      Social History   Tobacco Use  . Smoking status: Current Every Day Smoker    Packs/day: 0.25    Years: 22.00    Pack years: 5.50    Types: Cigarettes    Start date: 10/01/1997  . Smokeless tobacco: Never Used  Substance Use Topics  . Alcohol use: Yes    Comment: occasionally    With staff assistance, above reviewed with the patient today.  ROS: As per HPI, otherwise no specific complaints on a limited and focused system review   No results found for this or any previous visit (from the past 72 hour(s)).   PHQ2/9: Depression screen [facility] 2/9 12/14/2019 12/11/2019 09/10/2019 06/12/2019 12/11/2018  Decreased Interest 0 0 0 0 0  Down, Depressed, Hopeless 0 0 0 0 0  PHQ - 2 Score 0 0 0 0 0  Altered sleeping 0 0 0 0 1  Tired, decreased energy 0 0 0 0 0  Change in appetite 0 0 0 0 0  Feeling bad or failure about yourself  0 0 0 0 0  Trouble concentrating 0 0 0 0 0  Moving slowly or fidgety/restless 0 0 0 0 0  Suicidal thoughts 0 0 0 0 0  PHQ-9 Score 0 0 0 0 1  Difficult doing work/chores Not difficult at all Not difficult at all - Not difficult at all Not difficult at all  Some recent data might be hidden   PHQ-2/9 Result is neg   Fall Risk: Fall Risk  12/14/2019 12/11/2019 06/12/2019 05/06/2019 12/11/2018  Falls in the past year? 0 0 0 0 0  Number falls in past yr: 0 0 0 0 0  Injury with Fall? 0 0 0 0 0      Objective:   Vitals:   12/14/19 1437  BP: 108/72  Pulse: 81  Resp: 16  Temp: (!) 97.5 F (36.4 C)  TempSrc: Temporal  SpO2: 99%  Weight: 160 lb (72.6 kg)  Height: '5\' 5"'  (1.651 m)    Body mass index is 26.63 kg/m.  Physical Exam   NAD, masked HEENT -  [place]/AT, sclera anicteric, PERRL, EOMI, conj - non-inj'ed, no focal sinus tenderness to palpation ,TM and external auditory canal clear on left, nontender tugging on the left lobe.  TM slightly duller with the light reflex on the right  with no big bulging drum concern, no ruptured TM, canal minimally erythematous approaching the drum, less so distally.  Mild discomfort tugging on the lobe, not marked.  Pharynx clear Neck - supple, tender in the right subauricular node region, with a mild increase with the subauricular node on the right versus the left.  No other adenopathy concerns in the cervical chains, no rigidity Car - RRR without m/g/r Pulm- RR and effort normal at rest, CTA without wheeze or rales Neuro/psychiatric - affect was not flat, appropriate with conversation  Alert and oriented  Speech normal   Results for orders placed or performed in visit on 06/12/19  COMPLETE METABOLIC PANEL WITH GFR  Result Value Ref Range   Glucose, Bld 85 65 - 99 mg/dL   BUN 12 7 - 25 mg/dL   Creat 0.88 0.50 - 1.10 mg/dL   GFR, Est Non African American 85 > OR = 60 mL/min/1.29m   GFR, Est African American 98 > OR = 60 mL/min/1.739m  BUN/Creatinine Ratio NOT APPLICABLE 6 - 22 (calc)   Sodium 143 135 - 146 mmol/L   Potassium 4.0 3.5 - 5.3 mmol/L   Chloride 115 (H) 98 - 110 mmol/L   CO2 22 20 - 32 mmol/L   Calcium 8.8 8.6 - 10.2 mg/dL   Total Protein 6.6 6.1 - 8.1 g/dL   Albumin 4.3 3.6 - 5.1 g/dL   Globulin 2.3 1.9 - 3.7 g/dL (calc)   AG Ratio 1.9 1.0 - 2.5 (calc)   Total Bilirubin 0.6 0.2 - 1.2 mg/dL   Alkaline phosphatase (APISO) 84 31 - 125 U/L   AST 10 10 - 30 U/L   ALT 7 6 - 29 U/L  CBC with Differential/Platelet  Result Value Ref Range   WBC 6.8 3.8 - 10.8 Thousand/uL   RBC 3.91 3.80 - 5.10 Million/uL   Hemoglobin 12.8 11.7 - 15.5 g/dL   HCT 38.2 35.0 - 45.0 %   MCV 97.7 80.0 - 100.0 fL   MCH 32.7 27.0 - 33.0 pg   MCHC 33.5 32.0 - 36.0 g/dL   RDW 11.7 11.0 - 15.0 %   Platelets 293 140  - 400 Thousand/uL   MPV 10.0 7.5 - 12.5 fL   Neutro Abs 4,502 1,500 - 7,800 cells/uL   Lymphs Abs 1,741 850 - 3,900 cells/uL   Absolute Monocytes 490 200 - 950 cells/uL   Eosinophils Absolute 27 15 - 500 cells/uL   Basophils Absolute 41 0 - 200 cells/uL   Neutrophils Relative % 66.2 %   Total Lymphocyte 25.6 %   Monocytes Relative 7.2 %   Eosinophils Relative 0.4 %   Basophils Relative 0.6 %  Hemoglobin A1c  Result Value Ref Range   Hgb A1c MFr Bld 4.9 <5.7 % of total Hgb   Mean Plasma Glucose 94 (calc)   eAG (mmol/L) 5.2 (calc)  Lipid panel  Result Value Ref Range   Cholesterol 164 <200 mg/dL   HDL 44 (L) > OR = 50 mg/dL   Triglycerides 78 <150 mg/dL   LDL Cholesterol (Calc) 103 (H) mg/dL (calc)   Total CHOL/HDL Ratio 3.7 <5.0 (calc)   Non-HDL Cholesterol (Calc) 120 <130 mg/dL (calc)  TSH  Result Value Ref Range   TSH 1.19 mIU/L  HIV Antibody (routine testing w rflx)  Result Value Ref Range   HIV 1&2 Ab, 4th Generation NON-REACTIVE NON-REACTI  Hepatitis panel, acute  Result Value Ref Range   Hep A IgM NON-REACTIVE NON-REACTI   Hepatitis B  Surface Ag NON-REACTIVE NON-REACTI   Hep B C IgM NON-REACTIVE NON-REACTI   Hepatitis C Ab NON-REACTIVE NON-REACTI   SIGNAL TO CUT-OFF 0.03 <1.00  RPR  Result Value Ref Range   RPR Ser Ql REACTIVE (A) NON-REACTI  Rpr titer  Result Value Ref Range   RPR Titer 1:1 (H)   Fluorescent treponemal ab(fta)-IgG-bld  Result Value Ref Range   Fluorescent Treponemal ABS NON-REACTIVE NON-REACTI  Cervicovaginal ancillary only  Result Value Ref Range   Chlamydia Negative    Neisseria Gonorrhea Negative        Assessment & Plan:    1. Acute suppurative otitis media of right ear without spontaneous rupture of tympanic membrane, recurrence not specified Educated, noted does not look like a bad otitis media, but am concerned with the history, the adenopathy in the subauricular region or any ear infection concern.  Discussed options, and felt  best to add in Augmentin product-1 twice daily for 7 days. Also can use an ibuprofen or Tylenol type product as needed for discomfort Not to use Q-tips or stick anything in the ear presently until symptoms resolve. We will follow-up if symptoms not resolving with the above. - amoxicillin-clavulanate (AUGMENTIN) 875-125 MG tablet; Take 1 tablet by mouth 2 (two) times daily.  Dispense: 14 tablet; Refill: 0  2. Tobacco dependence Strongly encouraged complete tobacco cessation, and she told when she is working towards that.  3. Chronic rhinitis Recommended a Flonase product, with these now over-the-counter, and can use daily to help with chronic rhinitis symptoms.  Is also now a first line management option, and can use in combination with the antihistamine as needed.   She will follow-up if not improving or more problematic as noted above.    Towanda Malkin, MD 12/14/19 2:49 PM

## 2019-12-14 NOTE — Patient Instructions (Signed)
Otitis Media, Adult  Otitis media means that the middle ear is red and swollen (inflamed) and full of fluid. The condition usually goes away on its own. Follow these instructions at home:  Take over-the-counter and prescription medicines only as told by your doctor.  If you were prescribed an antibiotic medicine, take it as told by your doctor. Do not stop taking the antibiotic even if you start to feel better.  Keep all follow-up visits as told by your doctor. This is important. Contact a doctor if:  You have bleeding from your nose.  There is a lump on your neck.  You are not getting better in 5 days.  You feel worse instead of better. Get help right away if:  You have pain that is not helped with medicine.  You have swelling, redness, or pain around your ear.  You get a stiff neck.  You cannot move part of your face (paralyzed).  You notice that the bone behind your ear hurts when you touch it.  You get a very bad headache. Summary  Otitis media means that the middle ear is red, swollen, and full of fluid.  This condition usually goes away on its own. In some cases, treatment may be needed.  If you were prescribed an antibiotic medicine, take it as told by your doctor. This information is not intended to replace advice given to you by your health care provider. Make sure you discuss any questions you have with your health care provider. Document Revised: 08/30/2017 Document Reviewed: 10/08/2016 Elsevier Patient Education  2020 [employer]  

## 2020-01-04 ENCOUNTER — Ambulatory Visit: Payer: Self-pay

## 2020-01-04 ENCOUNTER — Ambulatory Visit: Payer: Managed Care, Other (non HMO) | Admitting: Family Medicine

## 2020-01-04 ENCOUNTER — Other Ambulatory Visit: Payer: Self-pay

## 2020-01-04 ENCOUNTER — Encounter: Payer: Self-pay | Admitting: Family Medicine

## 2020-01-04 VITALS — BP 142/82 | HR 97 | Temp 97.5°F | Resp 16 | Ht 65.0 in | Wt 164.7 lb

## 2020-01-04 DIAGNOSIS — M79671 Pain in right foot: Secondary | ICD-10-CM

## 2020-01-04 DIAGNOSIS — W450XXA Nail entering through skin, initial encounter: Secondary | ICD-10-CM

## 2020-01-04 DIAGNOSIS — Z23 Encounter for immunization: Secondary | ICD-10-CM | POA: Diagnosis not present

## 2020-01-04 DIAGNOSIS — S99921A Unspecified injury of right foot, initial encounter: Secondary | ICD-10-CM

## 2020-01-04 MED ORDER — CEPHALEXIN 500 MG PO CAPS: 500.0000 mg | ORAL_CAPSULE | Freq: Four times a day (QID) | ORAL | 0 refills | Status: DC

## 2020-01-04 MED ORDER — MELOXICAM 15 MG PO TABS: 15.0000 mg | ORAL_TABLET | Freq: Every day | ORAL | 0 refills | Status: DC

## 2020-01-04 NOTE — Telephone Encounter (Signed)
Pt stepped on a "rusty and dirty nail" yesterday at 6:00 pm. Pt stated the nail went in her right foot to the arch area. Pt stated that she is unsure how deep the puncture is. Pt was wearing shoes.  Pt stated that she washed with hydrogen peroxide and applied triple antibiotic ointment and band aids. Pt stated pain is moderate and there is redness around the puncture wound.  Pt stated her last tetanus was > 10 years ago.  Care advice given and pt verbalized understanding. Appt made for pt today at 1:40 pm with PCP.   Reason for Disposition . [1] Last tetanus shot > 5 years ago AND [2] DIRTY puncture (e.g., object OR skin was dirty, objects on ground/floor)  Answer Assessment - Initial Assessment Questions 1. LOCATION: "Where is the puncture located?"      Right foot on the arch 2. OBJECT: "What was the object that punctured the skin?"      Nail (rusty) 3. DEPTH: "How deep do you think the puncture goes?"      Pt is unsure 4. ONSET: "When did the injury occur?" (Minutes or hours)     01/03/20 at 6:00 pm 5. PAIN: "Is it painful?" If so, ask: "How bad is the pain?"  (Scale 1-10; or mild, moderate, severe)     Yes-moderate 6. TETANUS: "When was the last tetanus booster?"     > than 10 year 7. PREGNANCY: "Is there any chance you are pregnant?" "When was your last menstrual period?"     No - LMP 12/29/19  Protocols used: PUNCTURE WOUND-A-AH

## 2020-01-04 NOTE — Progress Notes (Signed)
Name: Hannah Gregory   MRN: [id number]    DOB: 23-Jan-1981   Date:01/04/2020       Progress Note  Subjective  Chief Complaint  Chief Complaint  Patient presents with  . Foot Pain    Onset- yesterday, was walking outside and stepped on a board with a rusty nail-nail went thru her shoe and into the arch of her foot.    HPI  Right foot  Pain: she was walking on her yard yesterday and stepped on a piece of wood that had nail sticking out. The nail went through the sole of her tennis shoes and got stuck in her foot. Her son had to pull the wood plank from her shoe. She states it bleed , developed pain and swelling immediately. She cleaned with peroxide, did not take any medication for it. She went to work today and pain was 8/10 when standing and applying pressure, but no pain if no pressure applied to the area.    Patient Active Problem List   Diagnosis Date Noted  . B12 deficiency 12/11/2019  . Vitamin D deficiency 12/11/2019  . Family history of pernicious anemia 12/11/2019  . Menometrorrhagia 06/17/2019  . Migraine with aura and without status migrainosus 11/29/2016  . Chronic daily headache 11/29/2016  . Neck pain 11/29/2016  . Scoliosis of thoracolumbar spine 11/29/2016  . Mild depression ([place]) 11/29/2016  . History of anxiety 11/29/2016    History reviewed. No pertinent surgical history.  Family History  Problem Relation Age of Onset  . Asthma Mother   . Hypertension Mother   . Hyperlipidemia Mother   . Thyroid disease Mother   . Heart attack Father   . Migraines Father   . Hypertension Father   . Hyperlipidemia Father   . Mental retardation Brother   . COPD Brother   . Hypertension Brother   . Emphysema Brother   . Breast cancer Maternal Grandmother   . Breast cancer Maternal Aunt   . Breast cancer Maternal Aunt     Social History   Tobacco Use  . Smoking status: Current Every Day Smoker    Packs/day: 0.25    Years: 22.00    Pack years: 5.50    Types:  Cigarettes    Start date: 10/01/1997  . Smokeless tobacco: Never Used  Substance Use Topics  . Alcohol use: Yes    Comment: occasionally     Current Outpatient Medications:  .  ascorbic acid (VITAMIN C) 500 MG tablet, Take by mouth., Disp: , Rfl:  .  cholecalciferol (VITAMIN D3) 25 MCG (1000 UNIT) tablet, Take 1,000 Units by mouth daily., Disp: , Rfl:  .  cyanocobalamin (,VITAMIN B-12,) 1000 MCG/ML injection, Inject 1 mL (1,000 mcg total) into the muscle every 30 (thirty) days., Disp: 1 mL, Rfl: 5 .  Fremanezumab-vfrm (AJOVY) 225 MG/1.5ML SOAJ, Inject into the skin., Disp: , Rfl:  .  Lysine HCl 500 MG TABS, Take 1 tablet by mouth daily., Disp: , Rfl:  .  medroxyPROGESTERone (DEPO-PROVERA) 150 MG/ML injection, Inject 1 mL (150 mg total) into the muscle every 3 (three) months., Disp: 1 mL, Rfl: 1 .  Nicotine 21-14-7 MG/24HR KIT, Place onto the skin., Disp: , Rfl:  .  SAFETY-LOK 3CC SYR 23GX1" 23G X 1" 3 ML MISC, , Disp: , Rfl:  .  Ubrogepant (UBRELVY) 100 MG TABS, Take 1 tablet by mouth daily as needed. May take 2 hours later if no improvement, Disp: 10 tablet, Rfl: 0 .  venlafaxine XR (  EFFEXOR-XR) 75 MG 24 hr capsule, Take 1 capsule (75 mg total) by mouth daily with breakfast., Disp: 90 capsule, Rfl: 1 .  vitamin B-12 (CYANOCOBALAMIN) 1000 MCG tablet, Take 1,000 mcg by mouth daily., Disp: , Rfl:  .  zinc sulfate (ZINC-220) 220 (50 Zn) MG capsule, Take 1 capsule by mouth daily., Disp: , Rfl:  .  amoxicillin-clavulanate (AUGMENTIN) 875-125 MG tablet, Take 1 tablet by mouth 2 (two) times daily. (Patient not taking: Reported on 01/04/2020), Disp: 14 tablet, Rfl: 0 .  promethazine (PHENERGAN) 12.5 MG tablet, Take 1 tablet (12.5 mg total) by mouth every 8 (eight) hours as needed for nausea or vomiting. (Patient not taking: Reported on 01/04/2020), Disp: 20 tablet, Rfl: 0 .  topiramate (TOPAMAX) 50 MG tablet, Take 1 tablet (50 mg total) by mouth 2 (two) times daily. (Patient not taking: Reported on  01/04/2020), Disp: 90 tablet, Rfl: 0  Allergies  Allergen Reactions  . Imitrex [Sumatriptan] Other (See Comments)    Side effect , chest pain, flushed   . Chantix [Varenicline Tartrate]     nausea  . Prednisone Hives and Other (See Comments)    Numbness of her face     I personally reviewed active problem list, medication list, allergies, family history, social history, health maintenance with the patient/caregiver today.   ROS  Ten systems reviewed and is negative except as mentioned in HPI   Objective  Vitals:   01/04/20 1327  BP: (!) 142/82  Pulse: 97  Resp: 16  Temp: (!) 97.5 F (36.4 C)  TempSrc: Temporal  SpO2: 99%  Weight: 164 lb 11.2 oz (74.7 kg)  Height: '5\' 5"'  (1.651 m)    Body mass index is 27.41 kg/m.  Physical Exam  Constitutional: Patient appears well-developed and well-nourished. No distress.  HEENT: head atraumatic, normocephalic, pupils equal and reactive to light Cardiovascular: Normal rate, regular rhythm and normal heart sounds.  No murmur heard. No BLE edema. Pulmonary/Chest: Effort normal and breath sounds normal. No respiratory distress. Abdominal: Soft.  There is no tenderness. Skin: puncture wound on mid right foot/medial aspect, tender to touch, swollen, no increase in warmth, no oozing.  Psychiatric: Patient has a normal mood and affect. behavior is normal. Judgment and thought content normal.  PHQ2/9: Depression screen [facility] 2/9 01/04/2020 12/14/2019 12/11/2019 09/10/2019 06/12/2019  Decreased Interest 0 0 0 0 0  Down, Depressed, Hopeless 0 0 0 0 0  PHQ - 2 Score 0 0 0 0 0  Altered sleeping 0 0 0 0 0  Tired, decreased energy 0 0 0 0 0  Change in appetite 0 0 0 0 0  Feeling bad or failure about yourself  0 0 0 0 0  Trouble concentrating 0 0 0 0 0  Moving slowly or fidgety/restless 0 0 0 0 0  Suicidal thoughts 0 0 0 0 0  PHQ-9 Score 0 0 0 0 0  Difficult doing work/chores Not difficult at all Not difficult at all Not difficult at all - Not  difficult at all  Some recent data might be hidden    phq 9 is negative   Fall Risk: Fall Risk  01/04/2020 12/14/2019 12/11/2019 06/12/2019 05/06/2019  Falls in the past year? 0 0 0 0 0  Number falls in past yr: 0 0 0 0 0  Injury with Fall? 0 0 0 0 0     Assessment & Plan  1. Nail, injury by, initial encounter  - cephALEXin (KEFLEX) 500 MG capsule; Take 1 capsule (500  mg total) by mouth 4 (four) times daily.  Dispense: 28 capsule; Refill: 0  2. Right foot injury, initial encounter  - cephALEXin (KEFLEX) 500 MG capsule; Take 1 capsule (500 mg total) by mouth 4 (four) times daily.  Dispense: 28 capsule; Refill: 0  3. Need for Tdap vaccination  - Tdap vaccine greater than or equal to 7yo IM  4. Foot pain, right  - meloxicam (MOBIC) 15 MG tablet; Take 1 tablet (15 mg total) by mouth daily.  Dispense: 30 tablet; Refill: 0  Keep area clean, elevate foot, soak on warm water ( bucket full with 1 tbsp of clorox bleach) start antibiotics, and call back if worsening of pain, redness or swelling for referral to podiatrist

## 2020-01-11 ENCOUNTER — Ambulatory Visit: Payer: Managed Care, Other (non HMO) | Admitting: Internal Medicine

## 2020-03-30 ENCOUNTER — Ambulatory Visit: Payer: Self-pay

## 2020-03-30 NOTE — Telephone Encounter (Signed)
Patient called stating that her BP is high today and she is having headache. First 171/107, 173/104.  She had a neurology appointment today and it was high then 157/?Marland Kitchen She reports no other symptoms but headache. She has not been Dx with hypertension.  She will go to ER for evaluation.  She was told to call after for follow up. Care advice was read to patient.  She verbalized understanding of all information.  Reason for Disposition . [1] Systolic BP  >= 160 OR Diastolic >= 100 AND [2] cardiac or neurologic symptoms (e.g., chest pain, difficulty breathing, unsteady gait, blurred vision)  Answer Assessment - Initial Assessment Questions 1. BLOOD PRESSURE: "What is the blood pressure?" "Did you take at least two measurements 5 minutes apart?"     173/104 171/107  At neuro office 157/? 2. ONSET: "When did you take your blood pressure?"     today 3. HOW: "How did you obtain the blood pressure?" (e.g., visiting nurse, automatic home BP monitor)     home 4. HISTORY: "Do you have a history of high blood pressure?"     no 5. MEDICATIONS: "Are you taking any medications for blood pressure?" "Have you missed any doses recently?"    Not taking med for BP 6. OTHER SYMPTOMS: "Do you have any symptoms?" (e.g., headache, chest pain, blurred vision, difficulty breathing, weakness)    headache 7. PREGNANCY: "Is there any chance you are pregnant?" "When was your last menstrual period?"    No on period now  Protocols used: BLOOD PRESSURE - HIGH-A-AH

## 2020-04-06 ENCOUNTER — Encounter: Payer: Self-pay | Admitting: Internal Medicine

## 2020-04-06 ENCOUNTER — Other Ambulatory Visit: Payer: Self-pay | Admitting: Internal Medicine

## 2020-04-06 ENCOUNTER — Ambulatory Visit: Payer: Managed Care, Other (non HMO) | Admitting: Internal Medicine

## 2020-04-06 ENCOUNTER — Ambulatory Visit
Admission: RE | Admit: 2020-04-06 | Discharge: 2020-04-06 | Disposition: A | Discharge status: Home / Self Care | Payer: Managed Care, Other (non HMO) | Source: Ambulatory Visit | Attending: Internal Medicine | Admitting: Internal Medicine

## 2020-04-06 ENCOUNTER — Other Ambulatory Visit: Payer: Self-pay

## 2020-04-06 VITALS — BP 138/90 | HR 97 | Temp 98.1°F | Resp 16 | Ht 65.0 in | Wt 168.5 lb

## 2020-04-06 DIAGNOSIS — R1032 Left lower quadrant pain: Secondary | ICD-10-CM | POA: Diagnosis present

## 2020-04-06 DIAGNOSIS — N200 Calculus of kidney: Secondary | ICD-10-CM

## 2020-04-06 DIAGNOSIS — R319 Hematuria, unspecified: Secondary | ICD-10-CM | POA: Diagnosis not present

## 2020-04-06 DIAGNOSIS — R109 Unspecified abdominal pain: Secondary | ICD-10-CM | POA: Diagnosis not present

## 2020-04-06 DIAGNOSIS — Z87442 Personal history of urinary calculi: Secondary | ICD-10-CM | POA: Diagnosis not present

## 2020-04-06 LAB — POCT URINALYSIS DIPSTICK
Appearance: ABNORMAL
Bilirubin, UA: NEGATIVE
Glucose, UA: NEGATIVE
Ketones, UA: NEGATIVE
Leukocytes, UA: NEGATIVE
Nitrite, UA: NEGATIVE
Odor: ABNORMAL
Protein, UA: NEGATIVE
Spec Grav, UA: 1.015 (ref 1.010–1.025)
Urobilinogen, UA: 0.2 E.U./dL
pH, UA: 7 (ref 5.0–8.0)

## 2020-04-06 LAB — BASIC METABOLIC PANEL WITH GFR
BUN: 12 mg/dL (ref 7–25)
CO2: 27 mmol/L (ref 20–32)
Calcium: 9.2 mg/dL (ref 8.6–10.2)
Chloride: 106 mmol/L (ref 98–110)
Creat: 0.91 mg/dL (ref 0.50–1.10)
GFR, Est African American: 94 mL/min/{1.73_m2} (ref >=60)
GFR, Est Non African American: 81 mL/min/{1.73_m2} (ref >=60)
Glucose, Bld: 79 mg/dL (ref 65–99)
Potassium: 4.8 mmol/L (ref 3.5–5.3)
Sodium: 139 mmol/L (ref 135–146)

## 2020-04-06 IMAGING — CT CT ABD-PELV W/O CM
2 of 4 series · 16 of 46 positions shown, 18 images · non-contrast
Comparison: [DATE] from [REDACTED]

CLINICAL DATA: Severe left flank and pelvic pain. Gross hematuria
and dysuria. Nephrolithiasis.

EXAM:
CT ABDOMEN AND PELVIS WITHOUT CONTRAST
TECHNIQUE: Multidetector CT imaging of the abdomen and pelvis was performed
following the standard protocol without IV contrast.

[Series 2: axials low dose 2 5.00 · axial · 0.64mm/px · z∈[-1456,-1066]mm · 13 of 86 slices shown, 15 images]
[im 4/86  soft-tissue]
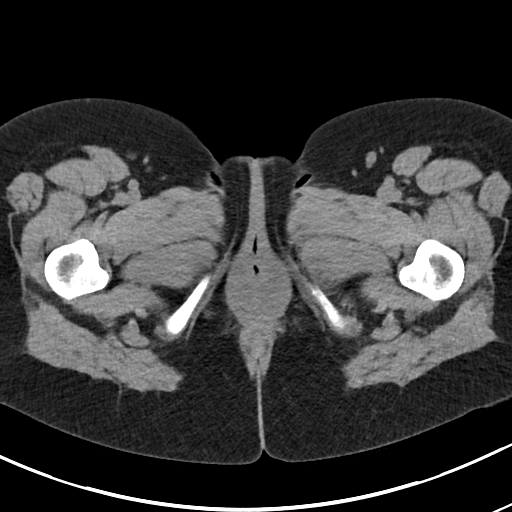
[im 4/86  bone]
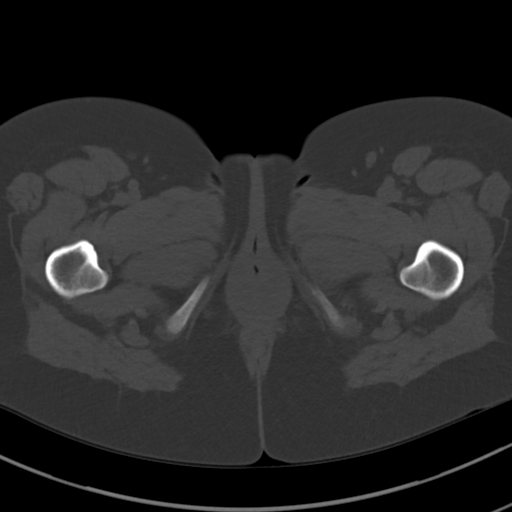
[im 11/86  soft-tissue]
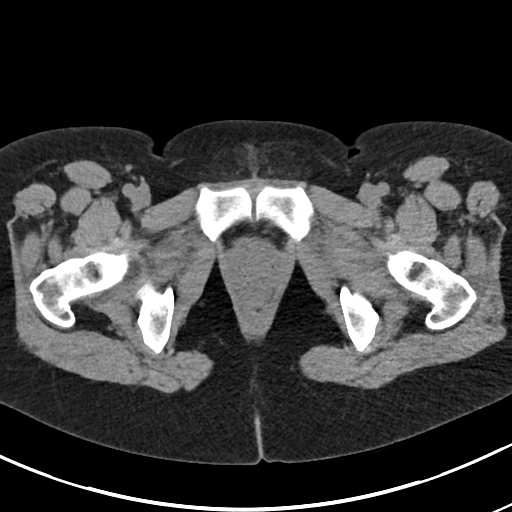
[im 18/86  soft-tissue]
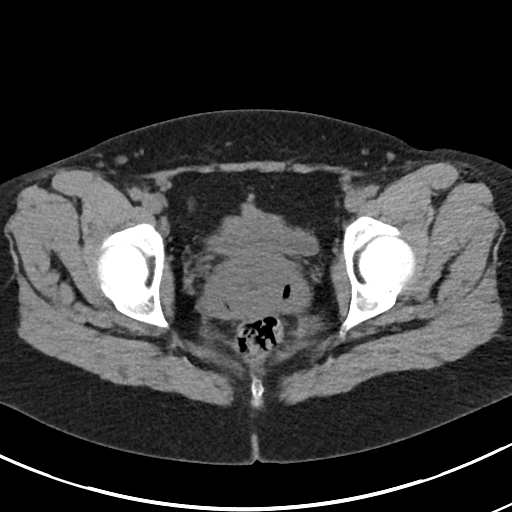
[im 24/86  soft-tissue]
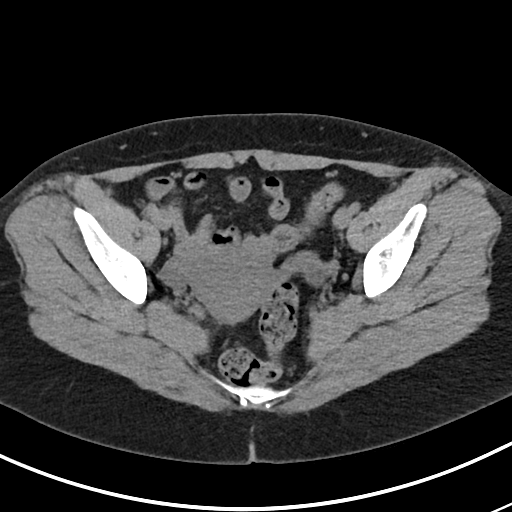
[im 31/86  soft-tissue]
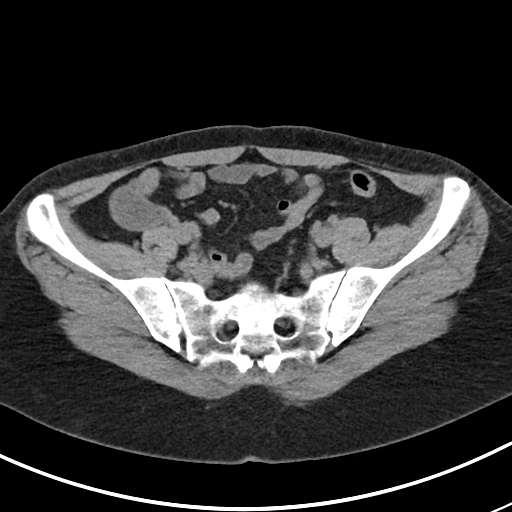
[im 38/86  soft-tissue]
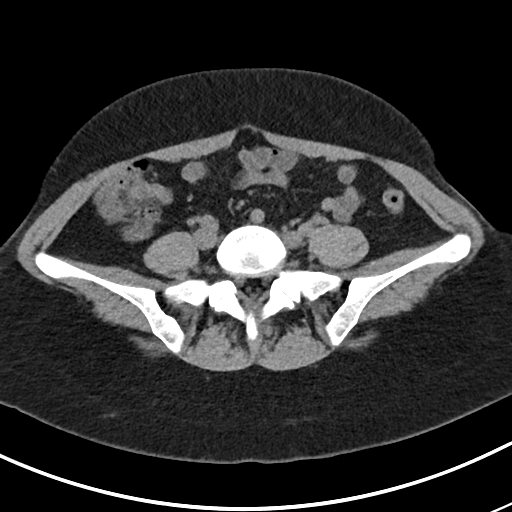
[im 45/86  soft-tissue]
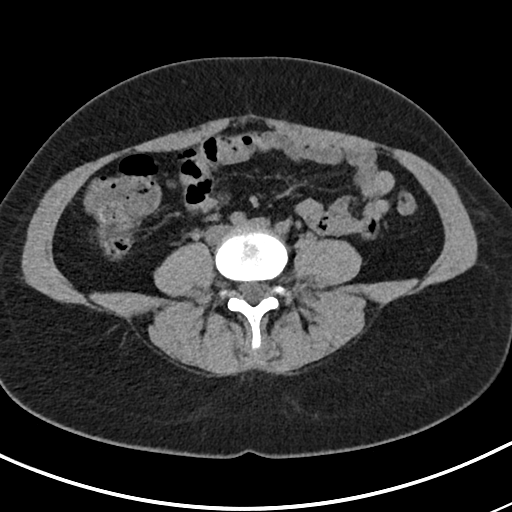
[im 48/86  soft-tissue]
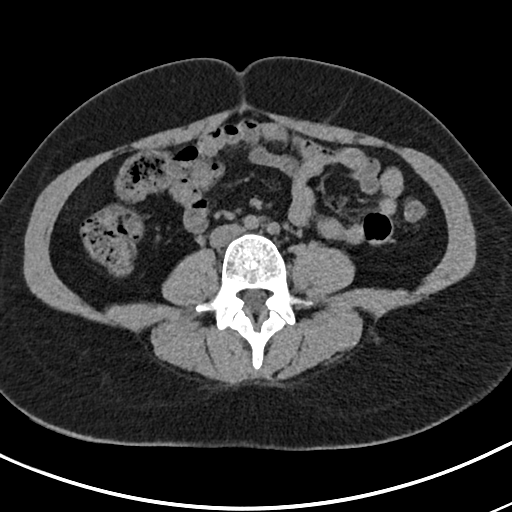
[im 55/86  soft-tissue]
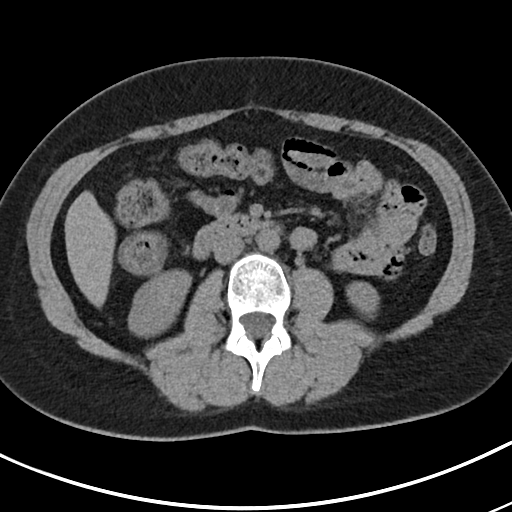
[im 55/86  bone]
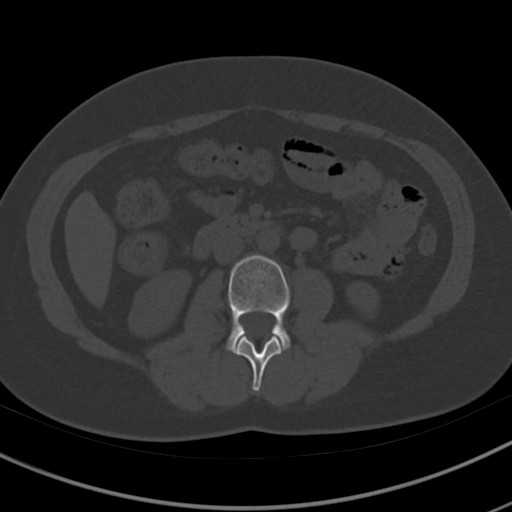
[im 62/86  soft-tissue]
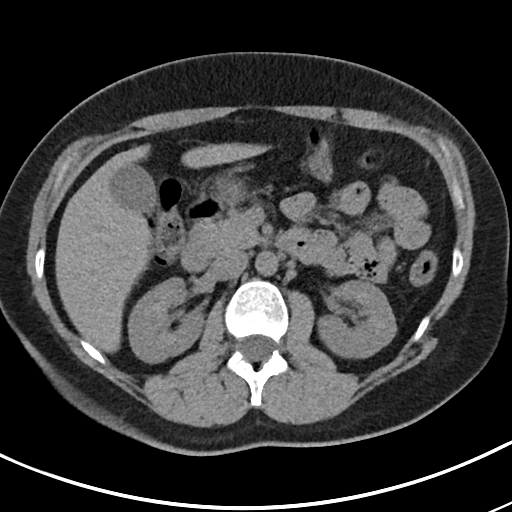
[im 69/86  soft-tissue]
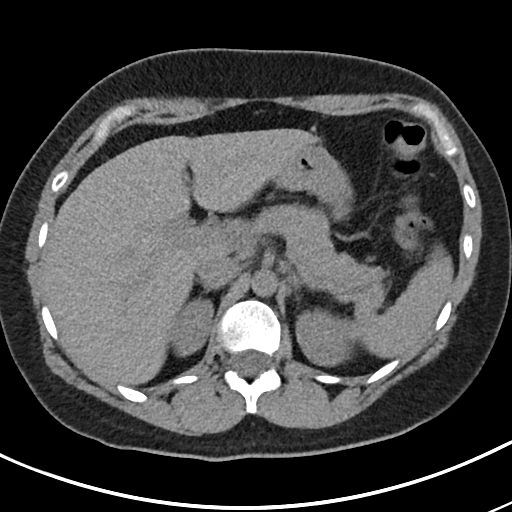
[im 75/86  soft-tissue]
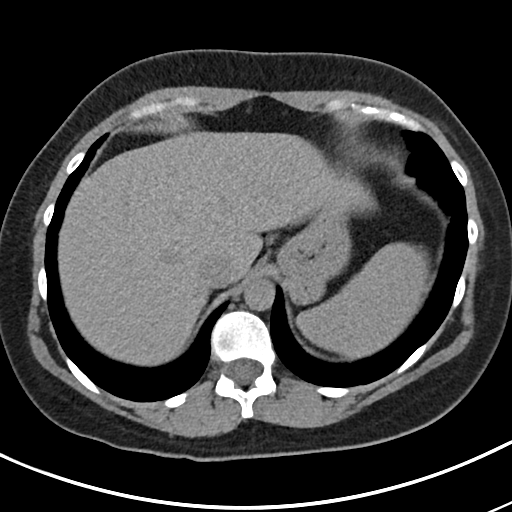
[im 82/86  soft-tissue]
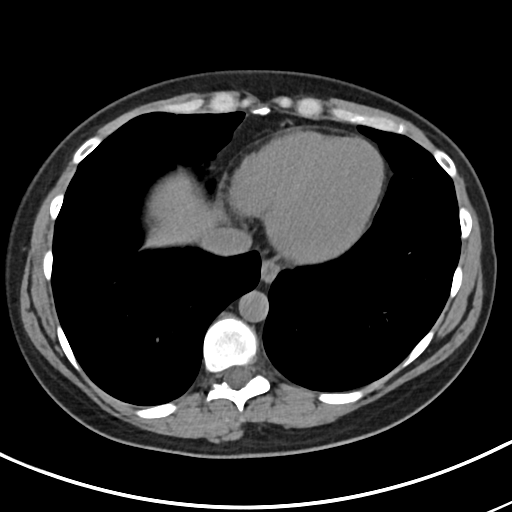

[Series 5: coronals low dose 2 2.00 cor · coronal · 0.64mm/px · 3 of 135 slices shown]
[im 45/135  soft-tissue]
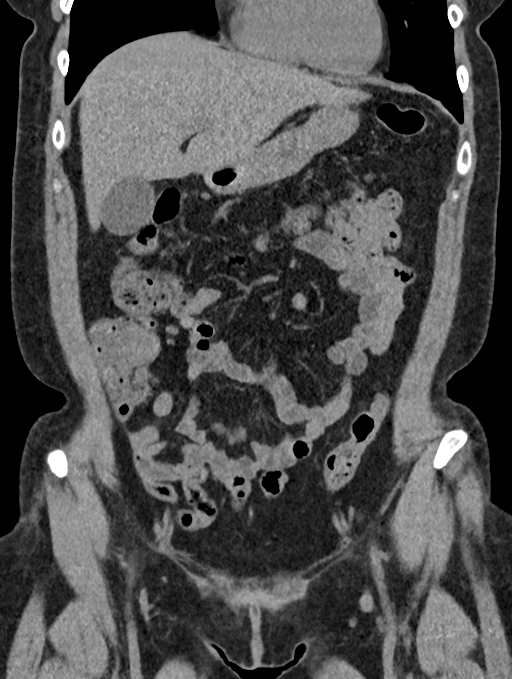
[im 60/135  soft-tissue]
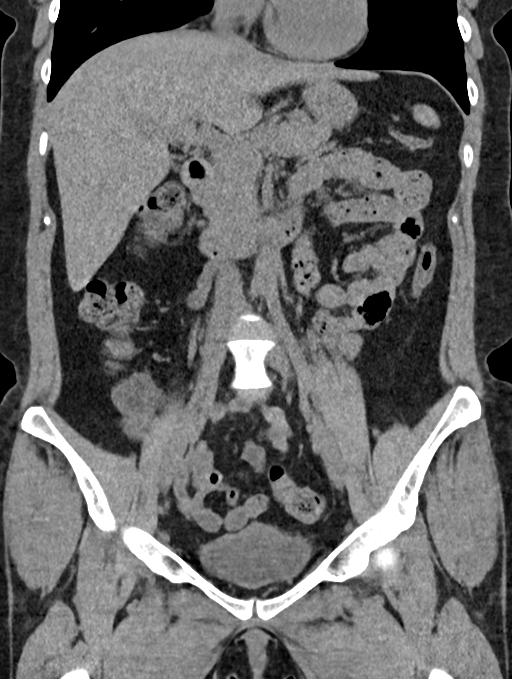
[im 75/135  soft-tissue]
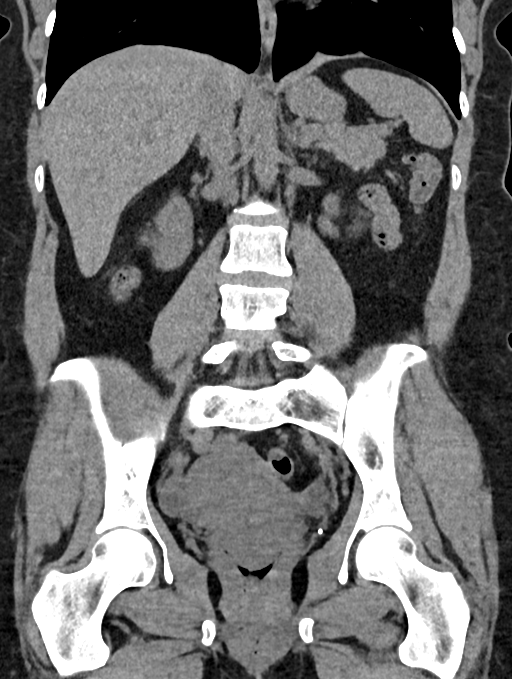

[16 of 46 positions shown; findings below may reference images not displayed]

FINDINGS: Lower chest: No acute findings.

Hepatobiliary: No mass visualized on this unenhanced exam.
Gallbladder is unremarkable. No evidence of biliary ductal
dilatation.

Pancreas: No mass or inflammatory process visualized on this
unenhanced exam.

Spleen:  Within normal limits in size.

Adrenals/Urinary tract: 5 mm calculus noted in midpole of left
kidney. No evidence of ureteral calculi or hydronephrosis.

Stomach/Bowel: No evidence of obstruction, inflammatory process, or
abnormal fluid collections.

Vascular/Lymphatic: No pathologically enlarged lymph nodes
identified. No evidence of abdominal aortic aneurysm.

Reproductive:  No mass or other significant abnormality.

Other:  None.

Musculoskeletal:  No suspicious bone lesions identified.
IMPRESSION: 5 mm left renal calculus. No evidence of ureteral calculi,
hydronephrosis, or other acute findings.

## 2020-04-06 MED ORDER — TAMSULOSIN HCL 0.4 MG PO CAPS: 0.4000 mg | ORAL_CAPSULE | Freq: Every day | ORAL | 0 refills | Status: DC

## 2020-04-06 NOTE — Addendum Note (Signed)
Addended by: Welford Roche D on: 04/06/2020 01:29 PM   Modules accepted: Orders

## 2020-04-06 NOTE — Patient Instructions (Addendum)
Stay very well-hydrated Take ibuprofen 600 mg up to 4 times a day as needed for pain, take with food A CT scan is being scheduled  The urine sample will be sent for a culture to help ensure there is not a concomitant infection If you start to have more severe pain, nausea and vomiting, please proceed to the emergency room setting for further management.   Kidney Stones Kidney stones are rock-like masses that form inside of the kidneys. Kidneys are organs that make pee (urine). A kidney stone may move into other parts of the urinary tract, including:  The tubes that connect the kidneys to the bladder (ureters).  The bladder.  The tube that carries urine out of the body (urethra). Kidney stones can cause very bad pain and can block the flow of pee. The stone usually leaves your body (passes) through your pee. You may need to have a doctor take out the stone. What are the causes? Kidney stones may be caused by:  A condition in which certain glands make too much parathyroid hormone (primary hyperparathyroidism).  A buildup of a type of crystals in the bladder made of a chemical called uric acid. The body makes uric acid when you eat certain foods.  Narrowing (stricture) of one or both of the ureters.  A kidney blockage that you were born with.  Past surgery on the kidney or the ureters, such as gastric bypass surgery. What increases the risk? You are more likely to develop this condition if:  You have had a kidney stone in the past.  You have a family history of kidney stones.  You do not drink enough water.  You eat a diet that is high in protein, salt (sodium), or sugar.  You are overweight or very overweight (obese). What are the signs or symptoms? Symptoms of a kidney stone may include:  Pain in the side of the belly, right below the ribs (flank pain). Pain usually spreads (radiates) to the groin.  Needing to pee often or right away (urgently).  Pain when going pee  (urinating).  Blood in your pee (hematuria).  Feeling like you may vomit (nauseous).  Vomiting.  Fever and chills. How is this treated? Treatment depends on the size, location, and makeup of the kidney stones. The stones will often pass out of the body through peeing. You may need to:  Drink more fluid to help pass the stone. In some cases, you may be given fluids through an IV tube put into one of your veins at the hospital.  Take medicine for pain.  Make changes in your diet to help keep kidney stones from coming back. Sometimes, medical procedures are needed to remove a kidney stone. This may involve:  A procedure to break up kidney stones using a beam of light (laser) or shock waves.  Surgery to remove the kidney stones. Follow these instructions at home: Medicines  Take over-the-counter and prescription medicines only as told by your doctor.  Ask your doctor if the medicine prescribed to you requires you to avoid driving or using heavy machinery. Eating and drinking  Drink enough fluid to keep your pee pale yellow. You may be told to drink at least 8-10 glasses of water each day. This will help you pass the stone.  If told by your doctor, change your diet. This may include: ? Limiting how much salt you eat. ? Eating more fruits and vegetables. ? Limiting how much meat, poultry, fish, and eggs you eat.  Follow instructions from your doctor about eating or drinking restrictions. General instructions  Collect pee samples as told by your doctor. You may need to collect a pee sample: ? 24 hours after a stone comes out. ? 8-12 weeks after a stone comes out, and every 6-12 months after that.  Strain your pee every time you pee (urinate), for as long as told. Use the strainer that your doctor recommends.  Do not throw out the stone. Keep it so that it can be tested by your doctor.  Keep all follow-up visits as told by your doctor. This is important. You may need follow-up  tests. How is this prevented? To prevent another kidney stone:  Drink enough fluid to keep your pee pale yellow. This is the best way to prevent kidney stones.  Eat healthy foods.  Avoid certain foods as told by your doctor. You may be told to eat less protein.  Stay at a healthy weight. Where to find more information  National Kid[employer] Foundation (NKF): www.kidney.org  [facility]): www.urologyhealth.org Contact a doctor if:  You have pain that gets worse or does not get better with medicine. Get help right away if:  You have a fever or chills.  You get very bad pain.  You get new pain in your belly (abdomen).  You pass out (faint).  You cannot pee. Summary  Kidney stones are rock-like masses that form inside of the kidneys.  Kidney stones can cause very bad pain and can block the flow of pee.  The stones will often pass out of the body through peeing.  Drink enough fluid to keep your pee pale yellow. This information is not intended to replace advice given to you by your health care provider. Make sure you discuss any questions you have with your health care provider. Document Revised: 02/03/2019 Document Reviewed: 02/03/2019 Elsevier Patient Education  2020 [employer].

## 2020-04-06 NOTE — Progress Notes (Addendum)
Patient ID: Hannah Gregory, female    DOB: [birth year], 39 y.o.   MRN: [id number]  PCP: Alba Cory, MD  Chief Complaint  Patient presents with  . Hematuria    sympoms started yesterday  . Flank Pain  . Bladder pain    Subjective:   Hannah Gregory is a 39 y.o. female, presents to clinic with CC of the following:  Chief Complaint  Patient presents with  . Hematuria    sympoms started yesterday  . Flank Pain  . Bladder pain    HPI:  Patient is a 39 year old female patient of Dr. Carlynn Purl Presents today with bladder and flank pain and some hematuria.  She noted the day before yesterday she was having some lower abdomen radiating up to her left flank area pain, and yesterday, she noticed her urine was more reddish-brown and uncomfortable to urinate. The pain has continued into today, and she is very uncomfortable sitting right now.  She notes the pain is a 6-7 out of 10.  She has been taking ibuprofen, alternating with Tylenol and notes that has not been helping all that much. She denies any fevers. She does have a history of kidney stones, the last was over a year ago, and has had to higher episodes of kidney stones. She denies any nausea or vomiting.  States she has been trying to stay well-hydrated.    Patient Active Problem List   Diagnosis Date Noted  . B12 deficiency 12/11/2019  . Vitamin D deficiency 12/11/2019  . Family history of pernicious anemia 12/11/2019  . Menometrorrhagia 06/17/2019  . Migraine with aura and without status migrainosus 11/29/2016  . Chronic daily headache 11/29/2016  . Neck pain 11/29/2016  . Scoliosis of thoracolumbar spine 11/29/2016  . Mild depression (HCC) 11/29/2016  . History of anxiety 11/29/2016      Current Outpatient Medications:  .  ascorbic acid (VITAMIN C) 500 MG tablet, Take by mouth., Disp: , Rfl:  .  cholecalciferol (VITAMIN D3) 25 MCG (1000 UNIT) tablet, Take 1,000 Units by mouth daily., Disp: , Rfl:  .   cyanocobalamin (,VITAMIN B-12,) 1000 MCG/ML injection, Inject 1 mL (1,000 mcg total) into the muscle every 30 (thirty) days., Disp: 1 mL, Rfl: 5 .  Fremanezumab-vfrm (AJOVY) 225 MG/1.5ML SOAJ, Inject into the skin., Disp: , Rfl:  .  SAFETY-LOK 3CC SYR 23GX1" 23G X 1" 3 ML MISC, , Disp: , Rfl:  .  Ubrogepant (UBRELVY) 100 MG TABS, Take 1 tablet by mouth daily as needed. May take 2 hours later if no improvement, Disp: 10 tablet, Rfl: 0 .  venlafaxine XR (EFFEXOR-XR) 75 MG 24 hr capsule, Take 1 capsule (75 mg total) by mouth daily with breakfast., Disp: 90 capsule, Rfl: 1 .  vitamin B-12 (CYANOCOBALAMIN) 1000 MCG tablet, Take 1,000 mcg by mouth daily., Disp: , Rfl:  .  zinc sulfate (ZINC-220) 220 (50 Zn) MG capsule, Take 1 capsule by mouth daily., Disp: , Rfl:    Allergies  Allergen Reactions  . Imitrex [Sumatriptan] Other (See Comments)    Side effect , chest pain, flushed   . Chantix [Varenicline Tartrate]     nausea  . Prednisone Hives and Other (See Comments)    Numbness of her face      No past surgical history on file.   Family History  Problem Relation Age of Onset  . Asthma Mother   . Hypertension Mother   . Hyperlipidemia Mother   . Thyroid disease Mother   .  Heart attack Father   . Migraines Father   . Hypertension Father   . Hyperlipidemia Father   . Mental retardation Brother   . COPD Brother   . Hypertension Brother   . Emphysema Brother   . Breast cancer Maternal Grandmother   . Breast cancer Maternal Aunt   . Breast cancer Maternal Aunt      Social History   Tobacco Use  . Smoking status: Current Every Day Smoker    Packs/day: 0.25    Years: 22.00    Pack years: 5.50    Types: Cigarettes    Start date: 10/01/1997  . Smokeless tobacco: Never Used  Substance Use Topics  . Alcohol use: Yes    Comment: occasionally    With staff assistance, above reviewed with the patient today.  ROS: As per HPI, otherwise no specific complaints on a limited and  focused system review   Results for orders placed or performed in visit on 04/06/20 (from the past 72 hour(s))  POCT Urinalysis Dipstick     Status: Abnormal   Collection Time: 04/06/20 10:53 AM  Result Value Ref Range   Color, UA Brown    Clarity, UA Cloudy    Glucose, UA Negative Negative   Bilirubin, UA Negative    Ketones, UA Negative    Spec Grav, UA 1.015 1.010 - 1.025   Blood, UA Large    pH, UA 7.0 5.0 - 8.0   Protein, UA Negative Negative   Urobilinogen, UA 0.2 0.2 or 1.0 E.U./dL   Nitrite, UA Negative    Leukocytes, UA Negative Negative   Appearance Abnormal    Odor Abnormal      PHQ2/9: Depression screen [facility] 2/9 04/06/2020 01/04/2020 12/14/2019 12/11/2019 09/10/2019  Decreased Interest 0 0 0 0 0  Down, Depressed, Hopeless 0 0 0 0 0  PHQ - 2 Score 0 0 0 0 0  Altered sleeping 0 0 0 0 0  Tired, decreased energy 0 0 0 0 0  Change in appetite 0 0 0 0 0  Feeling bad or failure about yourself  0 0 0 0 0  Trouble concentrating 0 0 0 0 0  Moving slowly or fidgety/restless 0 0 0 0 0  Suicidal thoughts 0 0 0 0 0  PHQ-9 Score 0 0 0 0 0  Difficult doing work/chores Not difficult at all Not difficult at all Not difficult at all Not difficult at all -  Some recent data might be hidden   PHQ-2/9 Result is neg  Fall Risk: Fall Risk  04/06/2020 01/04/2020 12/14/2019 12/11/2019 06/12/2019  Falls in the past year? 0 0 0 0 0  Number falls in past yr: 0 0 0 0 0  Injury with Fall? 0 0 0 0 0      Objective:   Vitals:   04/06/20 1043  BP: 138/90  Pulse: 97  Resp: 16  Temp: 98.1 F (36.7 C)  TempSrc: Temporal  SpO2: 97%  Weight: 168 lb 8 oz (76.4 kg)  Height: 5\' 5"  (1.651 m)    Body mass index is 28.04 kg/m.  Physical Exam   NAD, masked,  HEENT - [place]/AT, sclera anicteric,  Neck - supple, no adenopathy,  Car - RRR without m/g/r, not tachycardic Pulm- RR and effort normal at rest, CTA without wheeze or rales Abd - soft, tender in the left lower quadrant towards the left  flank, no right lower quadrant tenderness, some mild epigastric tenderness but not marked, no rebound or guarding,  ND, BS+,  no masses, no HSM Back -positive left CVA tenderness, no right CVA tenderness Ext - no LE edema,  Neuro/psychiatric - affect was not flat, appropriate with conversation  Alert   Speech normal   Results for orders placed or performed in visit on 04/06/20  POCT Urinalysis Dipstick  Result Value Ref Range   Color, UA Brown    Clarity, UA Cloudy    Glucose, UA Negative Negative   Bilirubin, UA Negative    Ketones, UA Negative    Spec Grav, UA 1.015 1.010 - 1.025   Blood, UA Large    pH, UA 7.0 5.0 - 8.0   Protein, UA Negative Negative   Urobilinogen, UA 0.2 0.2 or 1.0 E.U./dL   Nitrite, UA Negative    Leukocytes, UA Negative Negative   Appearance Abnormal    Odor Abnormal        Assessment & Plan:  1. Hematuria, unspecified type 2. Left lower quadrant abdominal pain 3. Flank pain 4. History of kidney stones The urine dip showed large blood, with negative nitrite and leukocytes, was brownish in appearance. I do feel clinically her signs and symptoms are consistent with a probable kidney stone, and she has a history of these.  We will send the urine for a UA C&S to rule out a concomitant infection which can occur with kidney stones. She is able to keep things down presently with no nausea and vomiting, and her pain is reasonably controlled at this point, and can try to manage this as an outpatient. We will order a CT scan urgently. Offered her a shot of Toradol in the office, and she did not think that was needed, and will continue the ibuprofen products up to 600 mg 4 times a day with food as needed for pain.  Discussed potential opioids that can sometimes be added for pain control, although will start with the NSAIDs presently.  Can also use Tylenol intermittently with the anti-inflammatories. Emphasized staying well-hydrated. Await the CT result, although it  may add a tamsulosin product if a stone is present on CT scan and within the appropriate range to potentially help with passage. Trying to find a strainer in the office here to have her use to help when she urinates, and if she does pass a stone, she should bring that in as we can send that to the lab for further analysis. We will check a BMP as well today. Also emphasized if symptoms more problematic with more severe pain, any nausea and vomiting, she will need to proceed to an emergency setting for further management. She was understanding of the recommendations today and will continue to stay in touch as await the CT scan, and how she is doing clinically. - POCT Urinalysis Dipstick - Urinalysis, Complete - Urine Culture - BASIC METABOLIC PANEL WITH GFR   Addendum: CT scan was completed with the following impression  IMPRESSION: 5 mm left renal calculus. No evidence of ureteral calculi, hydronephrosis, or other acute findings  We will prescribe tamsulosin once daily for up to 4 weeks.  Message will be sent to patient with Melissa calling to inform her to check her messages and of the above as well.      Jamelle Haring, MD 04/06/20 10:58 AM

## 2020-04-07 ENCOUNTER — Telehealth: Payer: Self-pay | Admitting: Internal Medicine

## 2020-04-07 LAB — URINALYSIS, COMPLETE
Bacteria, UA: NONE SEEN /HPF
Bilirubin Urine: NEGATIVE
Glucose, UA: NEGATIVE
Hyaline Cast: NONE SEEN /LPF
Ketones, ur: NEGATIVE
Nitrite: NEGATIVE
Specific Gravity, Urine: 1.011 (ref 1.001–1.03)
pH: 7 (ref 5.0–8.0)

## 2020-04-07 LAB — URINE CULTURE
MICRO NUMBER:: 10677056
Result:: NO GROWTH
SPECIMEN QUALITY:: ADEQUATE

## 2020-04-07 NOTE — Telephone Encounter (Signed)
Call patient at 1245 today. She notes she remains about the same as yesterday, still uncomfortable and notes that the ibuprofen with some intermittent Tylenol is keeping the pain tolerable.  She has had no vomiting.  Is trying to stay well-hydrated.  She did pick up the tamsulosin medicine yesterday and started that as well. I did review the CT scan results with her, noting to be 5 mm stone and no kidney enlargement, also informed her her basic metabolic panel done was normal, and her urinalysis showed the blood and are still awaiting the culture results presently.  Discussed again if she starts to have vomiting, unable to keep things down, more severe pain, she does proceed need to proceed to an emergency setting and she was understanding of that. Continue with increased hydration and the tamsulosin product daily presently. Did note if symptoms not improving and persisting over the next few days despite the above, we will need to ask urology to get involved early next week at the latest, and she is to call us if symptoms are not improving or more problematic.  Await her response to the above.

## 2020-04-11 ENCOUNTER — Telehealth: Payer: Self-pay

## 2020-04-11 NOTE — Telephone Encounter (Signed)
Per Dr. Dorris Fetch referral is recommended, but patient declined due to cost.  Referral will be placed regardless if not passed in in next 24-48hrs

## 2020-04-11 NOTE — Telephone Encounter (Signed)
Pt states she still has not passed, but does not want to see specialist due to money.  Is there anything else she can do?  She has increased fluid intake and is taking med.  How long does it usually take to pass one?

## 2020-04-11 NOTE — Telephone Encounter (Signed)
Called patient to see how she is doing today. No answer, left vm. I will also reach out to her via mychart.

## 2020-04-13 ENCOUNTER — Other Ambulatory Visit: Payer: Self-pay | Admitting: Internal Medicine

## 2020-04-13 DIAGNOSIS — N200 Calculus of kidney: Secondary | ICD-10-CM

## 2020-04-13 NOTE — Telephone Encounter (Signed)
Spoke with patient and she has not passed the stone yet. She says the pain is manageable but still there. I told the patient a referral will be placed to Urology and she did not disagree.

## 2020-04-13 NOTE — Progress Notes (Signed)
Patient is still symptomatic with the CT scan from 7/7 showing a kidney stone and the likely source. It has not passed with increased hydration, tamsulosin addition We will consult urology presently.

## 2020-04-15 ENCOUNTER — Encounter: Payer: Self-pay | Admitting: Urology

## 2020-04-29 ENCOUNTER — Encounter: Payer: Self-pay | Admitting: Family Medicine

## 2020-05-03 ENCOUNTER — Other Ambulatory Visit: Payer: Self-pay

## 2020-05-03 ENCOUNTER — Ambulatory Visit: Payer: Managed Care, Other (non HMO) | Admitting: Family Medicine

## 2020-05-03 ENCOUNTER — Encounter: Payer: Self-pay | Admitting: Family Medicine

## 2020-05-03 VITALS — BP 170/100 | HR 93 | Temp 98.3°F | Resp 14 | Ht 65.0 in | Wt 171.9 lb

## 2020-05-03 DIAGNOSIS — I10 Essential (primary) hypertension: Secondary | ICD-10-CM

## 2020-05-03 MED ORDER — HYDROCHLOROTHIAZIDE 12.5 MG PO TABS: 12.5000 mg | ORAL_TABLET | Freq: Every day | ORAL | 0 refills | Status: DC

## 2020-05-03 MED ORDER — ATENOLOL-CHLORTHALIDONE 50-25 MG PO TABS: 0.5000 | ORAL_TABLET | Freq: Every day | ORAL | 0 refills | Status: DC

## 2020-05-03 NOTE — Progress Notes (Signed)
Name: Hannah Gregory   MRN: [id number]    DOB: [birth year]   Date:05/03/2020       Progress Note  Subjective  Chief Complaint  Chief Complaint  Patient presents with  . Hypertension  . Headache    HPI  Elevated BP: she states that over the past few weeks she has noticed that she has not been feeling well, ears are hot , dizziness on and off, scotomas intermittently on both eyes, sometimes feels like her eyes are crossing ( no double vision - but feels like her eyes are crossing) , she also has noticed a different type of headache, described as aching sensation, that improves when sitting and resting . She decided to check her bp and has been elevated, it went up to 180/100 at work . BP has been going up and down lowest around 130's highest of 182/106. No nausea or vomiting, no neuro deficit. Father had bypass surgery and has a history of HTN. , mother also has HTN  No new medication , no change in life style, no change in diet.   She was diagnosed with a kidney stone about one month ago, CT did not show hydronephrosis. No dysuria or frequency.    Patient Active Problem List   Diagnosis Date Noted  . B12 deficiency 12/11/2019  . Vitamin D deficiency 12/11/2019  . Family history of pernicious anemia 12/11/2019  . Migraine without aura and without status migrainosus, not intractable 10/14/2019  . Menometrorrhagia 06/17/2019  . Migraine with aura and without status migrainosus 11/29/2016  . Chronic daily headache 11/29/2016  . Neck pain 11/29/2016  . Scoliosis of thoracolumbar spine 11/29/2016  . Mild depression (HCC) 11/29/2016  . History of anxiety 11/29/2016    History reviewed. No pertinent surgical history.  Family History  Problem Relation Age of Onset  . Asthma Mother   . Hypertension Mother   . Hyperlipidemia Mother   . Thyroid disease Mother   . Heart attack Father   . Migraines Father   . Hypertension Father   . Hyperlipidemia Father   . Mental retardation Brother    . COPD Brother   . Hypertension Brother   . Emphysema Brother   . Breast cancer Maternal Grandmother   . Breast cancer Maternal Aunt   . Breast cancer Maternal Aunt     Social History   Tobacco Use  . Smoking status: Current Every Day Smoker    Packs/day: 0.25    Years: 22.00    Pack years: 5.50    Types: Cigarettes    Start date: 10/01/1997  . Smokeless tobacco: Never Used  Substance Use Topics  . Alcohol use: Yes    Comment: occasionally     Current Outpatient Medications:  .  ascorbic acid (VITAMIN C) 500 MG tablet, Take by mouth., Disp: , Rfl:  .  cholecalciferol (VITAMIN D3) 25 MCG (1000 UNIT) tablet, Take 1,000 Units by mouth daily., Disp: , Rfl:  .  cyanocobalamin (,VITAMIN B-12,) 1000 MCG/ML injection, Inject 1 mL (1,000 mcg total) into the muscle every 30 (thirty) days., Disp: 1 mL, Rfl: 5 .  Fremanezumab-vfrm (AJOVY) 225 MG/1.5ML SOAJ, Inject into the skin., Disp: , Rfl:  .  SAFETY-LOK 3CC SYR 23GX1" 23G X 1" 3 ML MISC, , Disp: , Rfl:  .  Ubrogepant (UBRELVY) 100 MG TABS, Take 1 tablet by mouth daily as needed. May take 2 hours later if no improvement, Disp: 10 tablet, Rfl: 0 .  venlafaxine XR (EFFEXOR-XR) 75 MG  24 hr capsule, Take 1 capsule (75 mg total) by mouth daily with breakfast., Disp: 90 capsule, Rfl: 1 .  vitamin B-12 (CYANOCOBALAMIN) 1000 MCG tablet, Take 1,000 mcg by mouth daily., Disp: , Rfl:  .  tamsulosin (FLOMAX) 0.4 MG CAPS capsule, Take 1 capsule (0.4 mg total) by mouth daily. (Patient not taking: Reported on 05/03/2020), Disp: 28 capsule, Rfl: 0 .  zinc sulfate (ZINC-220) 220 (50 Zn) MG capsule, Take 1 capsule by mouth daily. (Patient not taking: Reported on 05/03/2020), Disp: , Rfl:   Allergies  Allergen Reactions  . Imitrex [Sumatriptan] Other (See Comments)    Side effect , chest pain, flushed   . Chantix [Varenicline Tartrate]     nausea  . Prednisone Hives and Other (See Comments)    Numbness of her face     I personally reviewed active  problem list, medication list, allergies, family history, social history, health maintenance with the patient/caregiver today.   ROS  Ten systems reviewed and is negative except as mentioned in HPI  Objective  Vitals:   05/03/20 1110  BP: (!) 162/110  Pulse: 93  Resp: 14  Temp: 98.3 F (36.8 C)  TempSrc: Temporal  SpO2: 98%  Weight: 171 lb 14.4 oz (78 kg)  Height: 5\' 5"  (1.651 m)    Body mass index is 28.61 kg/m.  Physical Exam  Constitutional: Patient appears well-developed and well-nourished. Overweight.  No distress.  HEENT: head atraumatic, normocephalic, pupils equal and reactive to light,  neck supple Neurological : normal gait and balance, cranial nerves intake, no nystagmus  Cardiovascular: Normal rate, regular rhythm and normal heart sounds.  No murmur heard. No BLE edema. Pulmonary/Chest: Effort normal and breath sounds normal. No respiratory distress. Abdominal: Soft.  There is no tenderness. Psychiatric: Patient has a normal mood and affect. behavior is normal. Judgment and thought content normal.  Recent Results (from the past 2160 hour(s))  POCT Urinalysis Dipstick     Status: Abnormal   Collection Time: 04/06/20 10:53 AM  Result Value Ref Range   Color, UA Brown    Clarity, UA Cloudy    Glucose, UA Negative Negative   Bilirubin, UA Negative    Ketones, UA Negative    Spec Grav, UA 1.015 1.010 - 1.025   Blood, UA Large    pH, UA 7.0 5.0 - 8.0   Protein, UA Negative Negative   Urobilinogen, UA 0.2 0.2 or 1.0 E.U./dL   Nitrite, UA Negative    Leukocytes, UA Negative Negative   Appearance Abnormal    Odor Abnormal   BASIC METABOLIC PANEL WITH GFR     Status: None   Collection Time: 04/06/20 11:37 AM  Result Value Ref Range   Glucose, Bld 79 65 - 99 mg/dL    Comment: .            Fasting reference interval .    BUN 12 7 - 25 mg/dL   Creat 06/07/20 2.62 - 0.35 mg/dL   GFR, Est Non African American 81 > OR = 60 mL/min/1.10m2   GFR, Est African  American 94 > OR = 60 mL/min/1.53m2   BUN/Creatinine Ratio NOT APPLICABLE 6 - 22 (calc)   Sodium 139 135 - 146 mmol/L   Potassium 4.8 3.5 - 5.3 mmol/L   Chloride 106 98 - 110 mmol/L   CO2 27 20 - 32 mmol/L   Calcium 9.2 8.6 - 10.2 mg/dL  Urinalysis, Complete     Status: Abnormal   Collection Time: 04/06/20 11:44  AM  Result Value Ref Range   Color, Urine DARK YELLOW YELLOW   APPearance CLEAR CLEAR   Specific Gravity, Urine 1.011 1.001 - 1.03   pH 7.0 5.0 - 8.0   Glucose, UA NEGATIVE NEGATIVE   Bilirubin Urine NEGATIVE NEGATIVE   Ketones, ur NEGATIVE NEGATIVE   Hgb urine dipstick 3+ (A) NEGATIVE   Protein, ur 1+ (A) NEGATIVE   Nitrite NEGATIVE NEGATIVE   Leukocytes,Ua TRACE (A) NEGATIVE   WBC, UA 0-5 0 - 5 /HPF   RBC / HPF 3-10 (A) 0 - 2 /HPF   Squamous Epithelial / LPF 6-10 (A) < OR = 5 /HPF   Bacteria, UA NONE SEEN NONE SEEN /HPF   Hyaline Cast NONE SEEN NONE SEEN /LPF  Urine Culture     Status: None   Collection Time: 04/06/20 11:44 AM   Specimen: Urine  Result Value Ref Range   MICRO NUMBER: [id number]    SPECIMEN QUALITY: Adequate    Sample Source URINE    STATUS: FINAL    Result: No Growth      PHQ2/9: Depression screen [facility] 2/9 05/03/2020 04/06/2020 01/04/2020 12/14/2019 12/11/2019  Decreased Interest 0 0 0 0 0  Down, Depressed, Hopeless 0 0 0 0 0  PHQ - 2 Score 0 0 0 0 0  Altered sleeping 0 0 0 0 0  Tired, decreased energy 0 0 0 0 0  Change in appetite 0 0 0 0 0  Feeling bad or failure about yourself  0 0 0 0 0  Trouble concentrating 0 0 0 0 0  Moving slowly or fidgety/restless 0 0 0 0 0  Suicidal thoughts 0 0 0 0 0  PHQ-9 Score 0 0 0 0 0  Difficult doing work/chores - Not difficult at all Not difficult at all Not difficult at all Not difficult at all  Some recent data might be hidden    phq 9 is negative   Fall Risk: Fall Risk  05/03/2020 04/06/2020 01/04/2020 12/14/2019 12/11/2019  Falls in the past year? 0 0 0 0 0  Number falls in past yr: 0 0 0 0 0  Injury with  Fall? 0 0 0 0 0     Functional Status Survey: Is the patient deaf or have difficulty hearing?: No Does the patient have difficulty seeing, even when wearing glasses/contacts?: No Does the patient have difficulty concentrating, remembering, or making decisions?: No Does the patient have difficulty walking or climbing stairs?: No Does the patient have difficulty dressing or bathing?: No Does the patient have difficulty doing errands alone such as visiting a doctor's office or shopping?: No    Assessment & Plan  1. Uncontrolled hypertension  - Microalbumin / creatinine urine ratio - COMPLETE METABOLIC PANEL WITH GFR - CBC with Differential/Platelet - TSH - hydrochlorothiazide (HYDRODIURIL) 12.5 MG tablet; Take 1 tablet (12.5 mg total) by mouth daily.  Dispense: 30 tablet; Refill: 0

## 2020-05-04 LAB — COMPLETE METABOLIC PANEL WITH GFR
AG Ratio: 1.6 (calc) (ref 1.0–2.5)
ALT: 9 U/L (ref 6–29)
AST: 14 U/L (ref 10–30)
Albumin: 4.2 g/dL (ref 3.6–5.1)
Alkaline phosphatase (APISO): 101 U/L (ref 31–125)
BUN: 13 mg/dL (ref 7–25)
CO2: 26 mmol/L (ref 20–32)
Calcium: 8.9 mg/dL (ref 8.6–10.2)
Chloride: 106 mmol/L (ref 98–110)
Creat: 0.87 mg/dL (ref 0.50–1.10)
GFR, Est African American: 99 mL/min/{1.73_m2} (ref >=60)
GFR, Est Non African American: 85 mL/min/{1.73_m2} (ref >=60)
Globulin: 2.7 g/dL (calc) (ref 1.9–3.7)
Glucose, Bld: 76 mg/dL (ref 65–139)
Potassium: 4 mmol/L (ref 3.5–5.3)
Sodium: 138 mmol/L (ref 135–146)
Total Bilirubin: 1.1 mg/dL (ref 0.2–1.2)
Total Protein: 6.9 g/dL (ref 6.1–8.1)

## 2020-05-04 LAB — MICROALBUMIN / CREATININE URINE RATIO
Creatinine, Urine: 37 mg/dL (ref 20–275)
Microalb Creat Ratio: 5 mcg/mg creat (ref <30)
Microalb, Ur: 0.2 mg/dL

## 2020-05-04 LAB — CBC WITH DIFFERENTIAL/PLATELET
Absolute Monocytes: 688 cells/uL (ref 200–950)
Basophils Absolute: 43 cells/uL (ref 0–200)
Basophils Relative: 0.5 %
Eosinophils Absolute: 120 cells/uL (ref 15–500)
Eosinophils Relative: 1.4 %
HCT: 41.6 % (ref 35.0–45.0)
Hemoglobin: 14 g/dL (ref 11.7–15.5)
Lymphs Abs: 2219 cells/uL (ref 850–3900)
MCH: 32.9 pg (ref 27.0–33.0)
MCHC: 33.7 g/dL (ref 32.0–36.0)
MCV: 97.9 fL (ref 80.0–100.0)
MPV: 10 fL (ref 7.5–12.5)
Monocytes Relative: 8 %
Neutro Abs: 5530 cells/uL (ref 1500–7800)
Neutrophils Relative %: 64.3 %
Platelets: 292 10*3/uL (ref 140–400)
RBC: 4.25 10*6/uL (ref 3.80–5.10)
RDW: 11.7 % (ref 11.0–15.0)
Total Lymphocyte: 25.8 %
WBC: 8.6 10*3/uL (ref 3.8–10.8)

## 2020-05-04 LAB — TSH: TSH: 1.34 mIU/L

## 2020-05-09 ENCOUNTER — Encounter: Payer: Self-pay | Admitting: Family Medicine

## 2020-05-09 ENCOUNTER — Other Ambulatory Visit: Payer: Self-pay | Admitting: Family Medicine

## 2020-05-09 DIAGNOSIS — I10 Essential (primary) hypertension: Secondary | ICD-10-CM

## 2020-05-09 MED ORDER — HYDRALAZINE HCL 25 MG PO TABS: 25.0000 mg | ORAL_TABLET | Freq: Three times a day (TID) | ORAL | 0 refills | Status: DC

## 2020-06-07 ENCOUNTER — Other Ambulatory Visit: Payer: Self-pay | Admitting: Family Medicine

## 2020-06-07 DIAGNOSIS — I10 Essential (primary) hypertension: Secondary | ICD-10-CM

## 2020-06-13 NOTE — Progress Notes (Deleted)
Name: Hannah Gregory   MRN: [id number]    DOB: 05/06/1981   Date:06/13/2020       Progress Note  Subjective  Chief Complaint  No chief complaint on file.   HPI  Patient presents for annual CPE.  Elevated BP: she states that over the past few weeks she has noticed that she has not been feeling well, ears are hot , dizziness on and off, scotomas intermittently on both eyes, sometimes feels like her eyes are crossing ( no double vision - but feels like her eyes are crossing) , she also has noticed a different type of headache, described as aching sensation, that improves when sitting and resting . She decided to check her bp and has been elevated, it went up to 180/100 at work . BP has been going up and down lowest around 130's highest of 182/106. No nausea or vomiting, no neuro deficit. Father had bypass surgery and has a history of HTN. , mother also has HTN  No new medication , no change in life style, no change in diet.   She was diagnosed with a kidney stone about one month ago, CT did not show hydronephrosis. No dysuria or frequency.     Diet: *** Exercise: ***     Office Visit from 05/03/2020 in [facility]  AUDIT-C Score 1     Depression: Phq 9 is  {Desc; negative/positive:13464::"negative"} Depression screen [facility] 2/9 05/03/2020 04/06/2020 01/04/2020 12/14/2019 12/11/2019  Decreased Interest 0 0 0 0 0  Down, Depressed, Hopeless 0 0 0 0 0  PHQ - 2 Score 0 0 0 0 0  Altered sleeping 0 0 0 0 0  Tired, decreased energy 0 0 0 0 0  Change in appetite 0 0 0 0 0  Feeling bad or failure about yourself  0 0 0 0 0  Trouble concentrating 0 0 0 0 0  Moving slowly or fidgety/restless 0 0 0 0 0  Suicidal thoughts 0 0 0 0 0  PHQ-9 Score 0 0 0 0 0  Difficult doing work/chores - Not difficult at all Not difficult at all Not difficult at all Not difficult at all  Some recent data might be hidden   Hypertension: BP Readings from Last 3 Encounters:  05/03/20 (!) 170/100   04/06/20 138/90  01/04/20 (!) 142/82   Obesity: Wt Readings from Last 3 Encounters:  05/03/20 171 lb 14.4 oz (78 kg)  04/06/20 168 lb 8 oz (76.4 kg)  01/04/20 164 lb 11.2 oz (74.7 kg)   BMI Readings from Last 3 Encounters:  05/03/20 28.61 kg/m  04/06/20 28.04 kg/m  01/04/20 27.41 kg/m     Vaccines:  HPV: up to at age 84 , ask insurance if age between 45-45  Shingrix:n/a Pneumonia: n/a Flu: *** educated and discussed with patient.  Hep C Screening: 06/12/2019 STD testing and prevention (HIV/chl/gon/syphilis): 06/12/2019 Intimate partner violence:Negative screening Sexual History :no pain during intercourse  Menstrual History/LMP/Abnormal Bleeding:cycles are irregular because of Depo  Incontinence Symptoms: no problems.   Breast cancer: N/A - Last Mammogram: N/A - BRCA gene screening: both grandmothers with breast cancer and one maternal aunt  Osteoporosis: Discussed high calcium and vitamin D supplementation, weight bearing exercises  Cervical cancer screening: Due 12/11/2023  Skin cancer: Discussed monitoring for atypical lesions  Colorectal cancer: n/a Lung cancer: Low Dose CT Chest recommended if Age 49-80 years, 30 pack-year currently smoking OR have quit w/in 15years. Patient does not qualify.   ECG:  Advanced Care Planning: A voluntary discussion about advance care planning including the explanation and discussion of advance directives.  Discussed health care proxy and Living will, and the patient was able to identify a health care proxy as ***.  Patient {DOES_DOES VOH:60737} have a living will at present time. If patient does have living will, I have requested they bring this to the clinic to be scanned in to their chart.  Lipids: Lab Results  Component Value Date   CHOL 164 06/12/2019   CHOL 183 03/26/2018   Lab Results  Component Value Date   HDL 44 (L) 06/12/2019   HDL 39 (L) 03/26/2018   Lab Results  Component Value Date   LDLCALC 103 (H)  06/12/2019   LDLCALC 127 (H) 03/26/2018   Lab Results  Component Value Date   TRIG 78 06/12/2019   TRIG 73 03/26/2018   Lab Results  Component Value Date   CHOLHDL 3.7 06/12/2019   CHOLHDL 4.7 03/26/2018   No results found for: LDLDIRECT  Glucose: Glucose  Date Value Ref Range Status  07/24/2014 94 65 - 99 mg/dL Final   Glucose, Bld  Date Value Ref Range Status  05/03/2020 76 65 - 139 mg/dL Final    Comment:    .        Non-fasting reference interval .   04/06/2020 79 65 - 99 mg/dL Final    Comment:    .            Fasting reference interval .   06/12/2019 85 65 - 99 mg/dL Final    Comment:    .            Fasting reference interval .     Patient Active Problem List   Diagnosis Date Noted  . B12 deficiency 12/11/2019  . Vitamin D deficiency 12/11/2019  . Family history of pernicious anemia 12/11/2019  . Migraine without aura and without status migrainosus, not intractable 10/14/2019  . Menometrorrhagia 06/17/2019  . Migraine with aura and without status migrainosus 11/29/2016  . Chronic daily headache 11/29/2016  . Neck pain 11/29/2016  . Scoliosis of thoracolumbar spine 11/29/2016  . Mild depression ([place]) 11/29/2016  . History of anxiety 11/29/2016    No past surgical history on file.  Family History  Problem Relation Age of Onset  . Asthma Mother   . Hypertension Mother   . Hyperlipidemia Mother   . Thyroid disease Mother   . Heart attack Father   . Migraines Father   . Hypertension Father   . Hyperlipidemia Father   . Mental retardation Brother   . COPD Brother   . Hypertension Brother   . Emphysema Brother   . Breast cancer Maternal Grandmother   . Breast cancer Maternal Aunt   . Breast cancer Maternal Aunt     Social History   Socioeconomic History  . Marital status: Single    Spouse name: Not on file  . Number of children: 3  . Years of education: Not on file  . Highest education level: High school graduate  Occupational  History  . Occupation: Pharmacy Tech     Comment: [employer]  Tobacco Use  . Smoking status: Current Every Day Smoker    Packs/day: 0.25    Years: 22.00    Pack years: 5.50    Types: Cigarettes    Start date: 10/01/1997  . Smokeless tobacco: Never Used  Vaping Use  . Vaping Use: Never used  Substance and Sexual  Activity  . Alcohol use: Yes    Comment: occasionally  . Drug use: No  . Sexual activity: Yes    Partners: Male    Birth control/protection: Condom, Injection  Other Topics Concern  . Not on file  Social History Narrative   Single mother of 3 children, lives across from her parents   Social Determinants of Health   Financial Resource Strain: [place]   . Difficulty of Paying Living Expenses: Not hard at all  Food Insecurity: No Food Insecurity  . Worried About Charity fundraiser in the Last Year: Never true  . Ran Out of Food in the Last Year: Never true  Transportation Needs: No Transportation Needs  . Lack of Transportation (Medical): No  . Lack of Transportation (Non-Medical): No  Physical Activity: Insufficiently Active  . Days of Exercise per Week: 3 days  . Minutes of Exercise per Session: 30 min  Stress: No Stress Concern Present  . Feeling of Stress : Not at all  Social Connections: Socially Isolated  . Frequency of Communication with Friends and Family: More than three times a week  . Frequency of Social Gatherings with Friends and Family: More than three times a week  . Attends Religious Services: Never  . Active Member of Clubs or Organizations: No  . Attends Archivist Meetings: Never  . Marital Status: Never married  Intimate Partner Violence: Not At Risk  . Fear of Current or Ex-Partner: No  . Emotionally Abused: No  . Physically Abused: No  . Sexually Abused: No     Current Outpatient Medications:  .  hydrochlorothiazide (MICROZIDE) 12.5 MG capsule, TAKE 1 CAPSULE (12.5MG) BY MOUTH ONCE DAILY *BOTTLE*, Disp: 30  capsule, Rfl: 0 .  ascorbic acid (VITAMIN C) 500 MG tablet, Take by mouth., Disp: , Rfl:  .  cholecalciferol (VITAMIN D3) 25 MCG (1000 UNIT) tablet, Take 1,000 Units by mouth daily., Disp: , Rfl:  .  cyanocobalamin (,VITAMIN B-12,) 1000 MCG/ML injection, Inject 1 mL (1,000 mcg total) into the muscle every 30 (thirty) days., Disp: 1 mL, Rfl: 5 .  Fremanezumab-vfrm (AJOVY) 225 MG/1.5ML SOAJ, Inject into the skin., Disp: , Rfl:  .  hydrALAZINE (APRESOLINE) 25 MG tablet, Take 1 tablet (25 mg total) by mouth 3 (three) times daily. Prn bp above 150/90, Disp: 90 tablet, Rfl: 0 .  SAFETY-LOK 3CC SYR 23GX1" 23G X 1" 3 ML MISC, , Disp: , Rfl:  .  Ubrogepant (UBRELVY) 100 MG TABS, Take 1 tablet by mouth daily as needed. May take 2 hours later if no improvement, Disp: 10 tablet, Rfl: 0 .  venlafaxine XR (EFFEXOR-XR) 75 MG 24 hr capsule, Take 1 capsule (75 mg total) by mouth daily with breakfast., Disp: 90 capsule, Rfl: 1 .  vitamin B-12 (CYANOCOBALAMIN) 1000 MCG tablet, Take 1,000 mcg by mouth daily., Disp: , Rfl:   Allergies  Allergen Reactions  . Imitrex [Sumatriptan] Other (See Comments)    Side effect , chest pain, flushed   . Chantix [Varenicline Tartrate]     nausea  . Prednisone Hives and Other (See Comments)    Numbness of her face      ROS  ***  Objective  There were no vitals filed for this visit.  There is no height or weight on file to calculate BMI.  Physical Exam ***  Recent Results (from the past 2160 hour(s))  POCT Urinalysis Dipstick     Status: Abnormal   Collection Time: 04/06/20 10:53 AM  Result Value Ref Range   Color, UA Brown    Clarity, UA Cloudy    Glucose, UA Negative Negative   Bilirubin, UA Negative    Ketones, UA Negative    Spec Grav, UA 1.015 1.010 - 1.025   Blood, UA Large    pH, UA 7.0 5.0 - 8.0   Protein, UA Negative Negative   Urobilinogen, UA 0.2 0.2 or 1.0 E.U./dL   Nitrite, UA Negative    Leukocytes, UA Negative Negative   Appearance  Abnormal    Odor Abnormal   BASIC METABOLIC PANEL WITH GFR     Status: None   Collection Time: 04/06/20 11:37 AM  Result Value Ref Range   Glucose, Bld 79 65 - 99 mg/dL    Comment: .            Fasting reference interval .    BUN 12 7 - 25 mg/dL   Creat 0.91 0.50 - 1.10 mg/dL   GFR, Est Non African American 81 > OR = 60 mL/min/1.29m   GFR, Est African American 94 > OR = 60 mL/min/1.710m  BUN/Creatinine Ratio NOT APPLICABLE 6 - 22 (calc)   Sodium 139 135 - 146 mmol/L   Potassium 4.8 3.5 - 5.3 mmol/L   Chloride 106 98 - 110 mmol/L   CO2 27 20 - 32 mmol/L   Calcium 9.2 8.6 - 10.2 mg/dL  Urinalysis, Complete     Status: Abnormal   Collection Time: 04/06/20 11:44 AM  Result Value Ref Range   Color, Urine DARK YELLOW YELLOW   APPearance CLEAR CLEAR   Specific Gravity, Urine 1.011 1.001 - 1.03   pH 7.0 5.0 - 8.0   Glucose, UA NEGATIVE NEGATIVE   Bilirubin Urine NEGATIVE NEGATIVE   Ketones, ur NEGATIVE NEGATIVE   Hgb urine dipstick 3+ (A) NEGATIVE   Protein, ur 1+ (A) NEGATIVE   Nitrite NEGATIVE NEGATIVE   Leukocytes,Ua TRACE (A) NEGATIVE   WBC, UA 0-5 0 - 5 /HPF   RBC / HPF 3-10 (A) 0 - 2 /HPF   Squamous Epithelial / LPF 6-10 (A) < OR = 5 /HPF   Bacteria, UA NONE SEEN NONE SEEN /HPF   Hyaline Cast NONE SEEN NONE SEEN /LPF  Urine Culture     Status: None   Collection Time: 04/06/20 11:44 AM   Specimen: Urine  Result Value Ref Range   MICRO NUMBER: [id number]  SPECIMEN QUALITY: Adequate    Sample Source URINE    STATUS: FINAL    Result: No Growth   Microalbumin / creatinine urine ratio     Status: None   Collection Time: 05/03/20 12:13 PM  Result Value Ref Range   Creatinine, Urine 37 20 - 275 mg/dL   Microalb, Ur 0.2 mg/dL    Comment: Reference Range Not established    Microalb Creat Ratio 5 <30 mcg/mg creat    Comment: . The ADA defines abnormalities in albumin excretion as follows: . Marland Kitchenategory         Result (mcg/mg creatinine) . Normal                     <30 Microalbuminuria         30-299  Clinical albuminuria   > OR = 300 . The ADA recommends that at least two of three specimens collected within a 3-6 month period be abnormal before considering a patient to be within a diagnostic category.   COMPLETE METABOLIC PANEL WITH GFR  Status: None   Collection Time: 05/03/20 12:13 PM  Result Value Ref Range   Glucose, Bld 76 65 - 139 mg/dL    Comment: .        Non-fasting reference interval .    BUN 13 7 - 25 mg/dL   Creat 0.87 0.50 - 1.10 mg/dL   GFR, Est Non African American 85 > OR = 60 mL/min/1.58m   GFR, Est African American 99 > OR = 60 mL/min/1.746m  BUN/Creatinine Ratio NOT APPLICABLE 6 - 22 (calc)   Sodium 138 135 - 146 mmol/L   Potassium 4.0 3.5 - 5.3 mmol/L   Chloride 106 98 - 110 mmol/L   CO2 26 20 - 32 mmol/L   Calcium 8.9 8.6 - 10.2 mg/dL   Total Protein 6.9 6.1 - 8.1 g/dL   Albumin 4.2 3.6 - 5.1 g/dL   Globulin 2.7 1.9 - 3.7 g/dL (calc)   AG Ratio 1.6 1.0 - 2.5 (calc)   Total Bilirubin 1.1 0.2 - 1.2 mg/dL   Alkaline phosphatase (APISO) 101 31 - 125 U/L   AST 14 10 - 30 U/L   ALT 9 6 - 29 U/L  CBC with Differential/Platelet     Status: None   Collection Time: 05/03/20 12:13 PM  Result Value Ref Range   WBC 8.6 3.8 - 10.8 Thousand/uL   RBC 4.25 3.80 - 5.10 Million/uL   Hemoglobin 14.0 11.7 - 15.5 g/dL   HCT 41.6 35 - 45 %   MCV 97.9 80.0 - 100.0 fL   MCH 32.9 27.0 - 33.0 pg   MCHC 33.7 32.0 - 36.0 g/dL   RDW 11.7 11.0 - 15.0 %   Platelets 292 140 - 400 Thousand/uL   MPV 10.0 7.5 - 12.5 fL   Neutro Abs 5,530 1,500 - 7,800 cells/uL   Lymphs Abs 2,219 850 - 3,900 cells/uL   Absolute Monocytes 688 200 - 950 cells/uL   Eosinophils Absolute 120 15 - 500 cells/uL   Basophils Absolute 43 0 - 200 cells/uL   Neutrophils Relative % 64.3 %   Total Lymphocyte 25.8 %   Monocytes Relative 8.0 %   Eosinophils Relative 1.4 %   Basophils Relative 0.5 %  TSH     Status: None   Collection Time: 05/03/20 12:13 PM   Result Value Ref Range   TSH 1.34 mIU/L    Comment:           Reference Range .           > or = 20 Years  0.40-4.50 .                Pregnancy Ranges           First trimester    0.26-2.66           Second trimester   0.55-2.73           Third trimester    0.43-2.91     Diabetic Foot Exam: Diabetic Foot Exam - Simple   No data filed     ***  Fall Risk: Fall Risk  05/03/2020 04/06/2020 01/04/2020 12/14/2019 12/11/2019  Falls in the past year? 0 0 0 0 0  Number falls in past yr: 0 0 0 0 0  Injury with Fall? 0 0 0 0 0   ***  Functional Status Survey:   ***  Assessment & Plan  There are no diagnoses linked to this encounter.  -USPSTF grade A and B recommendations reviewed with  patient; age-appropriate recommendations, preventive care, screening tests, etc discussed and encouraged; healthy living encouraged; see AVS for patient education given to patient -Discussed importance of 150 minutes of physical activity weekly, eat two servings of fish weekly, eat one serving of tree nuts ( cashews, pistachios, pecans, almonds.Marland Kitchen) every other day, eat 6 servings of fruit/vegetables daily and drink plenty of water and avoid sweet beverages.   -Reviewed Health Maintenance: ***

## 2020-06-14 ENCOUNTER — Encounter: Payer: Managed Care, Other (non HMO) | Admitting: Family Medicine

## 2020-06-30 ENCOUNTER — Telehealth: Payer: Managed Care, Other (non HMO) | Admitting: Physician Assistant

## 2020-06-30 ENCOUNTER — Telehealth: Payer: Self-pay

## 2020-06-30 ENCOUNTER — Encounter: Payer: Self-pay | Admitting: Family Medicine

## 2020-06-30 DIAGNOSIS — R110 Nausea: Secondary | ICD-10-CM

## 2020-06-30 DIAGNOSIS — U071 COVID-19: Secondary | ICD-10-CM

## 2020-06-30 MED ORDER — PROMETHAZINE HCL 25 MG PO TABS: 25.0000 mg | ORAL_TABLET | Freq: Two times a day (BID) | ORAL | 0 refills | Status: DC

## 2020-06-30 MED ORDER — BENZONATATE 100 MG PO CAPS: 100.0000 mg | ORAL_CAPSULE | Freq: Three times a day (TID) | ORAL | 0 refills | Status: DC | PRN

## 2020-06-30 NOTE — Progress Notes (Signed)
E-Visit for Corona Virus Screening  We are sorry you are not feeling well. We are here to help!  You have tested positive for COVID-19, meaning that you were infected with the novel coronavirus and could give the germ to others.    You have been enrolled in MyChart Home Monitoring for COVID-19. Daily you will receive a questionnaire within the MyChart website. Our COVID-19 response team will be monitoring your responses daily.  Please continue isolation at home, for at least 10 days since the start of your symptoms and until you have had 24 hours with no fever (without taking a fever reducer) and with improving of symptoms.  Please continue good preventive care measures, including:  frequent hand-washing, avoid touching your face, cover coughs/sneezes, stay out of crowds and keep a 6 foot distance from others.  Follow up with your provider or go to the nearest hospital ED for re-assessment if fever/cough/breathlessness return.  I have prescribed a medication that will help alleviate your symptoms of nausea and allow you to stay hydrated: Promethazine 25 mg take 1 tablet twice daily  You may also take acetaminophen (Tylenol) as needed for fever.   HOME CARE:  Drink clear liquids.  This is very important! Dehydration (the lack of fluid) can lead to a serious complication.  Start off with 1 tablespoon every 5 minutes for 8 hours.  You may begin eating bland foods after 8 hours without vomiting.  Start with saltine crackers, white bread, rice, mashed potatoes, applesauce.  After 48 hours on a bland diet, you may resume a normal diet.  Try to go to sleep.  Sleep often empties the stomach and relieves the need to vomit.  The following symptoms may appear 2-14 days after exposure: . Fever . Cough . Shortness of breath or difficulty breathing . Chills . Repeated shaking with chills . Muscle pain . Headache . Sore throat . New loss of taste or smell . Fatigue . Congestion or runny  nose . Nausea or vomiting . Diarrhea  Go to the nearest hospital ED for assessment if fever/cough/breathlessness are severe or illness seems like a threat to life.  It is vitally important that if you feel that you have an infection such as this virus or any other virus that you stay home and away from places where you may spread it to others.  You should avoid contact with people age 84 and older.   Reduce your risk of any infection by using the same precautions used for avoiding the common cold or flu:  Marland Kitchen Wash your hands often with soap and warm water for at least 20 seconds.  If soap and water are not readily available, use an alcohol-based hand sanitizer with at least 60% alcohol.  . If coughing or sneezing, cover your mouth and nose by coughing or sneezing into the elbow areas of your shirt or coat, into a tissue or into your sleeve (not your hands). . Avoid shaking hands with others and consider head nods or verbal greetings only. . Avoid touching your eyes, nose, or mouth with unwashed hands.  . Avoid close contact with people who are sick. . Avoid places or events with large numbers of people in one location, like concerts or sporting events. . Carefully consider travel plans you have or are making. . If you are planning any travel outside or inside the [place], visit the CDC's Travelers' Health webpage for the latest health notices. . If you have some symptoms but not all  symptoms, continue to monitor at home and seek medical attention if your symptoms worsen. . If you are having a medical emergency, call 911.  HOME CARE . Only take medications as instructed by your medical team. . Drink plenty of fluids and get plenty of rest. . A steam or ultrasonic humidifier can help if you have congestion.   GET HELP RIGHT AWAY IF YOU HAVE EMERGENCY WARNING SIGNS** FOR COVID-19. If you or someone is showing any of these signs seek emergency medical care immediately. Call 911 or proceed to your closest  emergency facility if: . You develop worsening high fever. . Trouble breathing . Bluish lips or face . Persistent pain or pressure in the chest . New confusion . Inability to wake or stay awake . You cough up blood. . Your symptoms become more severe  **This list is not all possible symptoms. Contact your medical provider for any symptoms that are sever or concerning to you.  MAKE SURE YOU   Understand these instructions.  Will watch your condition.  Will get help right away if you are not doing well or get worse.   GET HELP RIGHT AWAY IF:   Your symptoms do not improve or worsen within 2 days after treatment.  You have a fever for over 3 days.  You cannot keep down fluids after trying the medication.  MAKE SURE YOU:   Understand these instructions.  Will watch your condition.  Will get help right away if you are not doing well or get worse.  Your e-visit answers were reviewed by a board certified advanced clinical practitioner to complete your personal care plan.  Depending on the condition, your plan could have included both over the counter or prescription medications.  If there is a problem please reply once you have received a response from your provider.  Your safety is important to [place].  If you have drug allergies check your prescription carefully.    You can use MyChart to ask questions about today's visit, request a non-urgent call back, or ask for a work or school excuse for 24 hours related to this e-Visit. If it has been greater than 24 hours you will need to follow up with your provider, or enter a new e-Visit to address those concerns. You will get an e-mail in the next two days asking about your experience.  I hope that your e-visit has been valuable and will speed your recovery. Thank you for using e-visits.   Greater than 5 minutes, yet less than 10 minutes of time have been spent researching, coordinating and implementing care for this patient today.

## 2020-06-30 NOTE — Addendum Note (Signed)
Addended by: Sebastian Ache on: 06/30/2020 04:08 PM   Modules accepted: Orders

## 2020-06-30 NOTE — Telephone Encounter (Signed)
Pt called saying she is also having a lot of stuffiness in her nose and ears.  Tested positive on Monday a week ago.  She wants to know what she can do to help her symptoms.  Nasty congested cough dark in color.  No fever  [facility]  CB#  [phone]

## 2020-07-27 ENCOUNTER — Other Ambulatory Visit: Payer: Self-pay | Admitting: Family Medicine

## 2020-07-27 DIAGNOSIS — F325 Major depressive disorder, single episode, in full remission: Secondary | ICD-10-CM

## 2020-07-27 NOTE — Telephone Encounter (Signed)
Requested medication (s) are due for refill today: yes  Requested medication (s) are on the active medication list: yes  Last refill: 12/11/19  B-12 33ml 5 refills, Effexer XR   #90  1 refill  Future visit scheduled:yes  Notes to clinic:  Off protocol ,Labs out of date    Requested Prescriptions  Pending Prescriptions Disp Refills   cyanocobalamin (,VITAMIN B-12,) 1000 MCG/ML injection [Pharmacy Med Name: Cyanocobalamin 1000 MCG/ML Solution] 1 mL 11    Sig: INJECT 1ML=1000MCG INTRAMUSCULARLY EVERY MONTH      Off-Protocol Failed - 07/27/2020 10:03 AM      Failed - Medication not assigned to a protocol, review manually.      Passed - Valid encounter within last 12 months    Recent Outpatient Visits           2 months ago Uncontrolled hypertension   [facility] Alba Cory, MD   3 months ago Hematuria, unspecified type   [facility] Welford Roche D, MD   6 months ago Nail, injury by, initial encounter   [facility] Alba Cory, MD   7 months ago Acute suppurative otitis media of right ear without spontaneous rupture of tympanic membrane, recurrence not specified   [facility] Welford Roche D, MD   7 months ago Migraine with aura and without status migrainosus, not intractable   [facility] Alba Cory, MD       Future Appointments             In 2 months Alba Cory, MD [facility], [facility]           Off-Protocol Failed - 07/27/2020 10:03 AM      Failed - Medication not assigned to a protocol, review manually.      Passed - Valid encounter within last 12 months    Recent Outpatient Visits           2 months ago Uncontrolled hypertension   [facility] Alba Cory, MD   3 months ago Hematuria, unspecified type   [facility] Welford Roche D, MD   6 months ago Nail,  injury by, initial encounter   [facility] Alba Cory, MD   7 months ago Acute suppurative otitis media of right ear without spontaneous rupture of tympanic membrane, recurrence not specified   [facility] Welford Roche D, MD   7 months ago Migraine with aura and without status migrainosus, not intractable   [facility] [place], Danna Hefty, MD       Future Appointments             In 2 months Alba Cory, MD [facility]              venlafaxine XR (EFFEXOR-XR) 75 MG 24 hr capsule [Pharmacy Med Name: Venlafaxine HCl ER 75 MG Capsule extended release 24 hr] 90 capsule 11    Sig: TAKE 1 CAPSULE BY MOUTH ONCE DAILY W/ BREAKFAST *TAKE WITH FOOD* *DO NOT CRUSH OR CHEW*      Psychiatry: Antidepressants - SNRI - desvenlafaxine & venlafaxine Failed - 07/27/2020 10:03 AM      Failed - LDL in normal range and within 360 days    LDL Cholesterol (Calc)  Date Value Ref Range Status  06/12/2019 103 (H) mg/dL (calc) Final    Comment:    Reference range: <100 . Desirable range <  100 mg/dL for primary prevention;   <70 mg/dL for patients with CHD or diabetic patients  with > or = 2 CHD risk factors. Marland Kitchen LDL-C is now calculated using the Martin-Hopkins  calculation, which is a validated novel method providing  better accuracy than the Friedewald equation in the  estimation of LDL-C.  Horald Pollen et al. Lenox Ahr. 8676;720(94): 2061-2068  (http://education.[employer].com/faq/FAQ164)           Failed - Total Cholesterol in normal range and within 360 days    Cholesterol  Date Value Ref Range Status  06/12/2019 164 <200 mg/dL Final          Failed - Triglycerides in normal range and within 360 days    Triglycerides  Date Value Ref Range Status  06/12/2019 78 <150 mg/dL Final          Failed - Last BP in normal range    BP Readings from Last 1 Encounters:  05/03/20 (!) 170/100           Passed - Completed PHQ-2 or PHQ-9 in the last 360 days.      Passed - Valid encounter within last 6 months    Recent Outpatient Visits           2 months ago Uncontrolled hypertension   [facility] Alba Cory, MD   3 months ago Hematuria, unspecified type   [facility] Welford Roche D, MD   6 months ago Nail, injury by, initial encounter   [facility] Alba Cory, MD   7 months ago Acute suppurative otitis media of right ear without spontaneous rupture of tympanic membrane, recurrence not specified   [facility] Welford Roche D, MD   7 months ago Migraine with aura and without status migrainosus, not intractable   [facility] Alba Cory, MD       Future Appointments             In 2 months Carlynn Purl, Danna Hefty, MD [facility], [facility]

## 2020-08-05 ENCOUNTER — Encounter: Payer: Self-pay | Admitting: Family Medicine

## 2020-08-08 ENCOUNTER — Encounter: Payer: Self-pay | Admitting: Family Medicine

## 2020-08-09 ENCOUNTER — Other Ambulatory Visit: Payer: Self-pay | Admitting: Family Medicine

## 2020-08-09 DIAGNOSIS — K838 Other specified diseases of biliary tract: Secondary | ICD-10-CM

## 2020-08-09 DIAGNOSIS — K760 Fatty (change of) liver, not elsewhere classified: Secondary | ICD-10-CM

## 2020-08-09 DIAGNOSIS — R110 Nausea: Secondary | ICD-10-CM

## 2020-08-09 NOTE — Progress Notes (Signed)
Name: Hannah Gregory   MRN: [id number]    DOB: April 04, 1981   Date:08/10/2020       Progress Note  Subjective  Chief Complaint  Acute visit to discuss hypertension  HPI    HTN:  she was seen back in August because over the previous few weeks she has noticed that she has not been feeling well, ears are hot , dizziness on and off, scotomas intermittently on both eyes, sometimes feels like her eyes are crossing ( no double vision - but feels like her eyes are crossing) , she also has noticed a different type of headache, described as aching sensation, that improves when sitting and resting She decided to check her bp and has been elevated, it went up to 180/100 at work . BP has been going up and down lowest around 130's highest of 182/106. We gave her a rx of hctz 12.5 mg and checked labs that were all normal, she was also referred to nephrologist but appointment had to be cancelled because she was diagnosed with COVID-19. She is taking hctz and had some hydralazine to take prn, however bp is staying elevated in the 150/90's but it can go up to 190/100's , sometimes she feels drained and it usually happens when heart rate goes down to 60's but usually heart rate is in the 90's. We will check EKG today. Discussed trying tenoretic but needs to monitor pulse, add norvasc low dose to take at night only if bp is high and needs to follow up with nephrologist     Patient Active Problem List   Diagnosis Date Noted  . B12 deficiency 12/11/2019  . Vitamin D deficiency 12/11/2019  . Family history of pernicious anemia 12/11/2019  . Migraine without aura and without status migrainosus, not intractable 10/14/2019  . Menometrorrhagia 06/17/2019  . Migraine with aura and without status migrainosus 11/29/2016  . Chronic daily headache 11/29/2016  . Neck pain 11/29/2016  . Scoliosis of thoracolumbar spine 11/29/2016  . Mild depression (HCC) 11/29/2016  . History of anxiety 11/29/2016    No past surgical  history on file.  Family History  Problem Relation Age of Onset  . Asthma Mother   . Hypertension Mother   . Hyperlipidemia Mother   . Thyroid disease Mother   . Heart attack Father   . Migraines Father   . Hypertension Father   . Hyperlipidemia Father   . Mental retardation Brother   . COPD Brother   . Hypertension Brother   . Emphysema Brother   . Breast cancer Maternal Grandmother   . Breast cancer Maternal Aunt   . Breast cancer Maternal Aunt     Social History   Tobacco Use  . Smoking status: Current Every Day Smoker    Packs/day: 0.25    Years: 22.00    Pack years: 5.50    Types: Cigarettes    Start date: 10/01/1997  . Smokeless tobacco: Never Used  Substance Use Topics  . Alcohol use: Yes    Comment: occasionally     Current Outpatient Medications:  .  ascorbic acid (VITAMIN C) 500 MG tablet, Take by mouth., Disp: , Rfl:  .  atenolol-chlorthalidone (TENORETIC) 50-25 MG tablet, Take by mouth., Disp: , Rfl:  .  cholecalciferol (VITAMIN D3) 25 MCG (1000 UNIT) tablet, Take 1,000 Units by mouth daily., Disp: , Rfl:  .  cyanocobalamin (,VITAMIN B-12,) 1000 MCG/ML injection, INJECT 1ML=1000MCG INTRAMUSCULARLY EVERY MONTH, Disp: 1 mL, Rfl: 11 .  Fremanezumab-vfrm (AJOVY) 225  MG/1.5ML SOAJ, Inject into the skin., Disp: , Rfl:  .  hydrALAZINE (APRESOLINE) 25 MG tablet, Take 1 tablet (25 mg total) by mouth 3 (three) times daily. Prn bp above 150/90, Disp: 90 tablet, Rfl: 0 .  hydrochlorothiazide (MICROZIDE) 12.5 MG capsule, TAKE 1 CAPSULE (12.5MG ) BY MOUTH ONCE DAILY *BOTTLE*, Disp: 30 capsule, Rfl: 0 .  SAFETY-LOK 3CC SYR 23GX1" 23G X 1" 3 ML MISC, , Disp: , Rfl:  .  Ubrogepant (UBRELVY) 100 MG TABS, Take 1 tablet by mouth daily as needed. May take 2 hours later if no improvement, Disp: 10 tablet, Rfl: 0 .  venlafaxine XR (EFFEXOR-XR) 75 MG 24 hr capsule, TAKE 1 CAPSULE BY MOUTH ONCE DAILY W/ BREAKFAST *TAKE WITH FOOD* *DO NOT CRUSH OR CHEW*, Disp: 90 capsule, Rfl: 11 .   vitamin B-12 (CYANOCOBALAMIN) 1000 MCG tablet, Take 1,000 mcg by mouth daily., Disp: , Rfl:   Allergies  Allergen Reactions  . Imitrex [Sumatriptan] Other (See Comments)    Side effect , chest pain, flushed   . Chantix [Varenicline Tartrate]     nausea  . Prednisone Hives and Other (See Comments)    Numbness of her face     I personally reviewed active problem list, medication list, allergies, family history, social history, health maintenance with the patient/caregiver today.   ROS  Constitutional: Negative for fever or weight change.  Respiratory: Negative for cough and shortness of breath.   Cardiovascular: Negative for chest pain or palpitations.  Gastrointestinal: Negative for abdominal pain, no bowel changes.  Musculoskeletal: Negative for gait problem or joint swelling.  Skin: Negative for rash.  Neurological: positive  for dizziness and headache.  No other specific complaints in a complete review of systems (except as listed in HPI above).  Objective  Vitals:   08/10/20 1538 08/10/20 1549  BP: (!) 158/100 (!) 156/108  Pulse: 96   Resp: 17   Temp: 99.3 F (37.4 C)   TempSrc: Oral   SpO2: 99%   Weight: 179 lb 4.8 oz (81.3 kg)   Height: 5\' 5"  (1.651 m)     Body mass index is 29.84 kg/m.  Physical Exam  Constitutional: Patient appears well-developed and well-nourished. Overweight. No distress.  HEENT: head atraumatic, normocephalic, pupils equal and reactive to light, neck supple Cardiovascular: Normal rate, regular rhythm and normal heart sounds.  No murmur heard. No BLE edema. Pulmonary/Chest: Effort normal and breath sounds normal. No respiratory distress. Abdominal: Soft.  There is no tenderness. Psychiatric: Patient has a normal mood and affect. behavior is normal. Judgment and thought content normal.  PHQ2/9: Depression screen [facility] 2/9 08/10/2020 05/03/2020 04/06/2020 01/04/2020 12/14/2019  Decreased Interest 0 0 0 0 0  Down, Depressed, Hopeless 0 0 0 0 0   PHQ - 2 Score 0 0 0 0 0  Altered sleeping - 0 0 0 0  Tired, decreased energy - 0 0 0 0  Change in appetite - 0 0 0 0  Feeling bad or failure about yourself  - 0 0 0 0  Trouble concentrating - 0 0 0 0  Moving slowly or fidgety/restless - 0 0 0 0  Suicidal thoughts - 0 0 0 0  PHQ-9 Score - 0 0 0 0  Difficult doing work/chores - - Not difficult at all Not difficult at all Not difficult at all  Some recent data might be hidden    phq 9 is negative   Fall Risk: Fall Risk  08/10/2020 05/03/2020 04/06/2020 01/04/2020 12/14/2019  Falls in  the past year? 0 0 0 0 0  Number falls in past yr: 0 0 0 0 0  Injury with Fall? 0 0 0 0 0    Functional Status Survey: Is the patient deaf or have difficulty hearing?: No Does the patient have difficulty seeing, even when wearing glasses/contacts?: No Does the patient have difficulty concentrating, remembering, or making decisions?: No Does the patient have difficulty walking or climbing stairs?: No Does the patient have difficulty dressing or bathing?: No Does the patient have difficulty doing errands alone such as visiting a doctor's office or shopping?: No    Assessment & Plan  1. Labile essential hypertension  - amLODipine (NORVASC) 2.5 MG tablet; Take 1-2 tablets (2.5-5 mg total) by mouth daily.  Dispense: 60 tablet; Refill: 0 - atenolol-chlorthalidone (TENORETIC) 50-25 MG tablet; Take 0.5-1 tablets by mouth daily.  Dispense: 30 tablet; Refill: 0 - EKG 12-Lead  Advised to start with half pill only and titrate up as tolerated, call back to re-schedule visit with nephrologist, keep a log of bp readings and send me results by [employer]

## 2020-08-10 ENCOUNTER — Encounter: Payer: Self-pay | Admitting: Family Medicine

## 2020-08-10 ENCOUNTER — Other Ambulatory Visit: Payer: Self-pay

## 2020-08-10 ENCOUNTER — Ambulatory Visit: Payer: Managed Care, Other (non HMO) | Admitting: Family Medicine

## 2020-08-10 VITALS — BP 156/108 | HR 96 | Temp 99.3°F | Resp 17 | Ht 65.0 in | Wt 179.3 lb

## 2020-08-10 DIAGNOSIS — I10 Essential (primary) hypertension: Secondary | ICD-10-CM | POA: Diagnosis not present

## 2020-08-10 MED ORDER — AMLODIPINE BESYLATE 2.5 MG PO TABS: 2.5000 mg | ORAL_TABLET | Freq: Every day | ORAL | 0 refills | Status: DC

## 2020-08-10 MED ORDER — ATENOLOL-CHLORTHALIDONE 50-25 MG PO TABS: 0.5000 | ORAL_TABLET | Freq: Every day | ORAL | 0 refills | Status: DC

## 2020-08-15 HISTORY — PX: GALLBLADDER SURGERY: SHX652

## 2020-09-06 ENCOUNTER — Other Ambulatory Visit: Payer: Self-pay | Admitting: Family Medicine

## 2020-09-07 ENCOUNTER — Encounter: Payer: Self-pay | Admitting: Internal Medicine

## 2020-09-07 ENCOUNTER — Telehealth (INDEPENDENT_AMBULATORY_CARE_PROVIDER_SITE_OTHER): Payer: Managed Care, Other (non HMO) | Admitting: Internal Medicine

## 2020-09-07 DIAGNOSIS — B001 Herpesviral vesicular dermatitis: Secondary | ICD-10-CM | POA: Diagnosis not present

## 2020-09-07 MED ORDER — VALACYCLOVIR HCL 1 G PO TABS: ORAL_TABLET | ORAL | 1 refills | Status: DC

## 2020-09-07 NOTE — Progress Notes (Signed)
Name: Hannah Gregory   MRN: [id number]    DOB: 11-20-1980   Date:09/07/2020       Progress Note  Subjective  Chief Complaint  Chief Complaint  Patient presents with  . Mouth Lesions    I connected with  Elisse A Youngblood on 09/07/20 at  9:40 AM EST by telephone and verified that I am speaking with the correct person using two identifiers.  I discussed the limitations, risks, security and privacy concerns of performing an evaluation and management service by telephone and the availability of in person appointments. The patient expressed understanding and agreed to proceed. Staff also discussed with the patient that there may be a patient responsible charge related to this service. Patient Location: work Dispensing optician: [facility] Additional Individuals present: none  HPI Patient is a 39 year old female patient of Dr. 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 She notes recently, she has had three cold sores in the past 3 weeks.  1 on the upper lip, with concerns even having some nares involvement, and then yesterday, had blisters on the lower lip arising, and this morning her lower lip was swollen, with some tiny blisters beneath the lip towards the chin area noted.  She notes she had some tingling and burning, and more uncomfortable with the lip swelling.  She denies any marked surrounding erythema, has had no fevers.  No nausea or vomiting, no facial droop or loss of sensation in the face.  She notes this is like her typical cold sores as they are blistery and itchy and tingling/burning. Has tried to use carmex on a fairly reg basis, tried abreva topically. Notes has significant stresses recent past, quitting in addition on her house, difficult stresses with children. Long time ago tried creams, never on oral pills to manage. She notes they typically go away over time,  and this time, she is concerned with the recurrence of lesions over the past 3 weeks, and the lip swelling this morning. She is a current everyday smoker.  Allergies-Imitrex, Chantix, and prednisone noted  Patient Active Problem List   Diagnosis Date Noted  . B12 deficiency 12/11/2019  . Vitamin D deficiency 12/11/2019  . Family history of pernicious anemia 12/11/2019  . Migraine without aura and without status migrainosus, not intractable 10/14/2019  . Menometrorrhagia 06/17/2019  . Migraine with aura and without status migrainosus 11/29/2016  . Chronic daily headache 11/29/2016  . Neck pain 11/29/2016  . Scoliosis of thoracolumbar spine 11/29/2016  . Mild depression (HCC) 11/29/2016  . History of anxiety 11/29/2016    History reviewed. No pertinent surgical history.  Family History  Problem Relation Age of Onset  . Asthma Mother   . Hypertension Mother   . Hyperlipidemia Mother   . Thyroid disease Mother   . Heart attack Father   . Migraines Father   . Hypertension Father   . Hyperlipidemia Father   . Mental retardation Brother   . COPD Brother   . Hypertension Brother   . Emphysema Brother   . Breast cancer Maternal Grandmother   . Breast cancer Maternal Aunt   . Breast cancer Maternal Aunt     Social History   Tobacco Use  . Smoking status: Current Every Day Smoker    Packs/day: 0.25    Years: 22.00    Pack years: 5.50    Types: Cigarettes  Start date: 10/01/1997  . Smokeless tobacco: Never Used  Substance Use Topics  . Alcohol use: Yes    Comment: occasionally     Current Outpatient Medications:  .  amLODipine (NORVASC) 2.5 MG tablet, Take 1-2 tablets (2.5-5 mg total) by mouth daily., Disp: 60 tablet, Rfl: 0 .  ascorbic acid (VITAMIN C) 500 MG tablet, Take by mouth., Disp: , Rfl:  .  atenolol-chlorthalidone (TENORETIC) 50-25 MG tablet, Take 0.5-1 tablets by mouth daily., Disp: 30 tablet, Rfl: 0 .  cholecalciferol (VITAMIN D3) 25 MCG (1000 UNIT)  tablet, Take 1,000 Units by mouth daily., Disp: , Rfl:  .  cyanocobalamin (,VITAMIN B-12,) 1000 MCG/ML injection, INJECT 1ML=1000MCG INTRAMUSCULARLY EVERY MONTH, Disp: 1 mL, Rfl: 11 .  Fremanezumab-vfrm (AJOVY) 225 MG/1.5ML SOAJ, Inject into the skin., Disp: , Rfl:  .  hydrALAZINE (APRESOLINE) 25 MG tablet, Take 1 tablet (25 mg total) by mouth 3 (three) times daily. Prn bp above 150/90, Disp: 90 tablet, Rfl: 0 .  SAFETY-LOK 3CC SYR 23GX1" 23G X 1" 3 ML MISC, , Disp: , Rfl:  .  Ubrogepant (UBRELVY) 100 MG TABS, Take 1 tablet by mouth daily as needed. May take 2 hours later if no improvement, Disp: 10 tablet, Rfl: 0 .  venlafaxine XR (EFFEXOR-XR) 75 MG 24 hr capsule, TAKE 1 CAPSULE BY MOUTH ONCE DAILY W/ BREAKFAST *TAKE WITH FOOD* *DO NOT CRUSH OR CHEW*, Disp: 90 capsule, Rfl: 11 .  vitamin B-12 (CYANOCOBALAMIN) 1000 MCG tablet, Take 1,000 mcg by mouth daily., Disp: , Rfl:   Allergies  Allergen Reactions  . Imitrex [Sumatriptan] Other (See Comments)    Side effect , chest pain, flushed   . Chantix [Varenicline Tartrate]     nausea  . Prednisone Hives and Other (See Comments)    Numbness of her face     With staff assistance, above reviewed with the patient/caregiver today.  ROS: As per HPI, otherwise no specific complaints on a limited and focused system review   Objective  Virtual encounter, vitals not obtained.  There is no height or weight on file to calculate BMI.  Physical Exam   Appears in NAD via conversation, pleasant Breathing: No obvious respiratory distress. Speaking in complete sentences Neurological: Pt is alert, Speech is normal Psychiatric: Patient has a normal mood and affect, behavior is normal. Judgment and thought content normal.   No results found for this or any previous visit (from the past 72 hour(s)).  PHQ2/9: Depression screen [facility] 2/9 09/07/2020 08/10/2020 05/03/2020 04/06/2020 01/04/2020  Decreased Interest 0 0 0 0 0  Down, Depressed, Hopeless 0 0 0 0 0   PHQ - 2 Score 0 0 0 0 0  Altered sleeping 0 - 0 0 0  Tired, decreased energy 0 - 0 0 0  Change in appetite 0 - 0 0 0  Feeling bad or failure about yourself  0 - 0 0 0  Trouble concentrating 0 - 0 0 0  Moving slowly or fidgety/restless 0 - 0 0 0  Suicidal thoughts 0 - 0 0 0  PHQ-9 Score 0 - 0 0 0  Difficult doing work/chores - - - Not difficult at all Not difficult at all  Some recent data might be hidden   PHQ-2/9 Result reviewed  Fall Risk: Fall Risk  09/07/2020 08/10/2020 05/03/2020 04/06/2020 01/04/2020  Falls in the past year? 0 0 0 0 0  Number falls in past yr: 0 0 0 0 0  Injury with Fall? 0 0 0 0 0  Assessment & Plan  1. Recurrent cold sores Patient noted this is very typical of her cold sores that she has experienced in the past, and concerns for any potential secondary infection as discussed seems unlikely presently. Do feel adding an oral regimen to help is indicated, with Valtrex-2 g every 12 hours for 2 doses recommended. Also emphasized not to touch or pick at the areas to help lessen chances of a secondary bacterial infection, and closely monitor for that over time. If she does have fevers, marked redness developing, or other more concerning symptoms, she does need to follow-up. Did note in the future, when she feels like an outbreak of cold sores are coming on, she cannot take the Valtrex medicine as recommended today to help blunt the response.  Did note it is best when started within 48 to 72 hours of the symptoms beginning, and not much help after 72 hours. I did provide enough for a couple courses in the future with her present prescription, and also did put 1 refill on that. She was understanding of the recommendations today.  - valACYclovir (VALTREX) 1000 MG tablet; Take 2 tablets every 12 hours for 2 doses total, then stop taking.  Can take in the future if symptoms of cold sores just beginning to help blunt the response  Dispense: 12 tablet; Refill: 1  Should  follow-up if symptoms not improving or worsening.  I discussed the assessment and treatment plan with the patient. The patient was provided an opportunity to ask questions and all were answered. The patient agreed with the plan and demonstrated an understanding of the instructions.   The patient was advised to call back or seek an in-person evaluation if the symptoms worsen or if the condition fails to improve as anticipated.  I provided 15 minutes of non-face-to-face time during this encounter that included discussing at length patient's sx/history, pertinent pmhx, medications, treatment and follow up plan. This time also included the necessary documentation, orders, and chart review.  Jamelle Haring, MD

## 2020-09-08 ENCOUNTER — Emergency Department: Payer: Managed Care, Other (non HMO)

## 2020-09-08 ENCOUNTER — Encounter: Payer: Self-pay | Admitting: Emergency Medicine

## 2020-09-08 ENCOUNTER — Other Ambulatory Visit: Payer: Self-pay

## 2020-09-08 ENCOUNTER — Inpatient Hospital Stay
Admission: EM | Admit: 2020-09-08 | Discharge: 2020-09-10 | DRG: 419 | Disposition: A | Discharge status: Home / Self Care | Payer: Managed Care, Other (non HMO) | Attending: Surgery | Admitting: Surgery

## 2020-09-08 DIAGNOSIS — E538 Deficiency of other specified B group vitamins: Secondary | ICD-10-CM | POA: Diagnosis present

## 2020-09-08 DIAGNOSIS — Z20822 Contact with and (suspected) exposure to covid-19: Secondary | ICD-10-CM | POA: Diagnosis present

## 2020-09-08 DIAGNOSIS — F17210 Nicotine dependence, cigarettes, uncomplicated: Secondary | ICD-10-CM | POA: Diagnosis present

## 2020-09-08 DIAGNOSIS — Z825 Family history of asthma and other chronic lower respiratory diseases: Secondary | ICD-10-CM | POA: Diagnosis not present

## 2020-09-08 DIAGNOSIS — Z810 Family history of intellectual disabilities: Secondary | ICD-10-CM

## 2020-09-08 DIAGNOSIS — E559 Vitamin D deficiency, unspecified: Secondary | ICD-10-CM | POA: Diagnosis present

## 2020-09-08 DIAGNOSIS — Z83438 Family history of other disorder of lipoprotein metabolism and other lipidemia: Secondary | ICD-10-CM

## 2020-09-08 DIAGNOSIS — Z888 Allergy status to other drugs, medicaments and biological substances status: Secondary | ICD-10-CM | POA: Diagnosis not present

## 2020-09-08 DIAGNOSIS — G43909 Migraine, unspecified, not intractable, without status migrainosus: Secondary | ICD-10-CM | POA: Diagnosis present

## 2020-09-08 DIAGNOSIS — Z87442 Personal history of urinary calculi: Secondary | ICD-10-CM | POA: Diagnosis not present

## 2020-09-08 DIAGNOSIS — R1011 Right upper quadrant pain: Secondary | ICD-10-CM

## 2020-09-08 DIAGNOSIS — K8000 Calculus of gallbladder with acute cholecystitis without obstruction: Principal | ICD-10-CM | POA: Diagnosis present

## 2020-09-08 DIAGNOSIS — K660 Peritoneal adhesions (postprocedural) (postinfection): Secondary | ICD-10-CM | POA: Diagnosis present

## 2020-09-08 DIAGNOSIS — Z79899 Other long term (current) drug therapy: Secondary | ICD-10-CM

## 2020-09-08 DIAGNOSIS — Z8249 Family history of ischemic heart disease and other diseases of the circulatory system: Secondary | ICD-10-CM

## 2020-09-08 DIAGNOSIS — Z8349 Family history of other endocrine, nutritional and metabolic diseases: Secondary | ICD-10-CM | POA: Diagnosis not present

## 2020-09-08 DIAGNOSIS — R109 Unspecified abdominal pain: Secondary | ICD-10-CM | POA: Diagnosis present

## 2020-09-08 LAB — URINALYSIS, COMPLETE (UACMP) WITH MICROSCOPIC
Bilirubin Urine: NEGATIVE
Glucose, UA: NEGATIVE mg/dL
Ketones, ur: NEGATIVE mg/dL
Leukocytes,Ua: NEGATIVE
Nitrite: NEGATIVE
Protein, ur: NEGATIVE mg/dL
Specific Gravity, Urine: 1.018 (ref 1.005–1.030)
pH: 5 (ref 5.0–8.0)

## 2020-09-08 LAB — RESP PANEL BY RT-PCR (FLU A&B, COVID) ARPGX2
Influenza A by PCR: NEGATIVE
Influenza B by PCR: NEGATIVE
SARS Coronavirus 2 by RT PCR: NEGATIVE

## 2020-09-08 LAB — CBC WITH DIFFERENTIAL/PLATELET
Abs Immature Granulocytes: 0.06 10*3/uL (ref 0.00–0.07)
Basophils Absolute: 0.1 10*3/uL (ref 0.0–0.1)
Basophils Relative: 1 %
Eosinophils Absolute: 0.1 10*3/uL (ref 0.0–0.5)
Eosinophils Relative: 1 %
HCT: 39.3 % (ref 36.0–46.0)
Hemoglobin: 14.4 g/dL (ref 12.0–15.0)
Immature Granulocytes: 0 %
Lymphocytes Relative: 17 %
Lymphs Abs: 2.8 10*3/uL (ref 0.7–4.0)
MCH: 33.6 pg (ref 26.0–34.0)
MCHC: 36.6 g/dL — ABNORMAL HIGH (ref 30.0–36.0)
MCV: 91.8 fL (ref 80.0–100.0)
Monocytes Absolute: 1 10*3/uL (ref 0.1–1.0)
Monocytes Relative: 6 %
Neutro Abs: 12.4 10*3/uL — ABNORMAL HIGH (ref 1.7–7.7)
Neutrophils Relative %: 75 %
Platelets: 295 10*3/uL (ref 150–400)
RBC: 4.28 MIL/uL (ref 3.87–5.11)
RDW: 12.1 % (ref 11.5–15.5)
WBC: 16.5 10*3/uL — ABNORMAL HIGH (ref 4.0–10.5)
nRBC: 0 % (ref 0.0–0.2)

## 2020-09-08 LAB — POC URINE PREG, ED: Preg Test, Ur: NEGATIVE

## 2020-09-08 LAB — COMPREHENSIVE METABOLIC PANEL
ALT: 21 U/L (ref 0–44)
AST: 24 U/L (ref 15–41)
Albumin: 4.2 g/dL (ref 3.5–5.0)
Alkaline Phosphatase: 97 U/L (ref 38–126)
Anion gap: 10 (ref 5–15)
BUN: 20 mg/dL (ref 6–20)
CO2: 19 mmol/L — ABNORMAL LOW (ref 22–32)
Calcium: 10 mg/dL (ref 8.9–10.3)
Chloride: 106 mmol/L (ref 98–111)
Creatinine, Ser: 0.91 mg/dL (ref 0.44–1.00)
GFR, Estimated: >60 mL/min (ref >60)
Glucose, Bld: 136 mg/dL — ABNORMAL HIGH (ref 70–99)
Potassium: 3.5 mmol/L (ref 3.5–5.1)
Sodium: 135 mmol/L (ref 135–145)
Total Bilirubin: 0.9 mg/dL (ref 0.3–1.2)
Total Protein: 7.7 g/dL (ref 6.5–8.1)

## 2020-09-08 LAB — TROPONIN I (HIGH SENSITIVITY)
Troponin I (High Sensitivity): 4 ng/L (ref <18)
Troponin I (High Sensitivity): 3 ng/L (ref <18)

## 2020-09-08 LAB — LIPASE, BLOOD: Lipase: 25 U/L (ref 11–51)

## 2020-09-08 IMAGING — CT CT RENAL STONE PROTOCOL
3 of 4 series · 10 of 46 positions shown, 15 images · non-contrast
Comparison: CT scan [DATE]

CLINICAL DATA: Woke up with mid abdominal and back pain.

EXAM:
CT ABDOMEN AND PELVIS WITHOUT CONTRAST
TECHNIQUE: Multidetector CT imaging of the abdomen and pelvis was performed
following the standard protocol without IV contrast.

[Series 4: lung bases · axial · 0.91mm/px · z∈[-594,-489]mm · 6 of 31 slices shown, 11 images]
[im 5/31  soft-tissue]
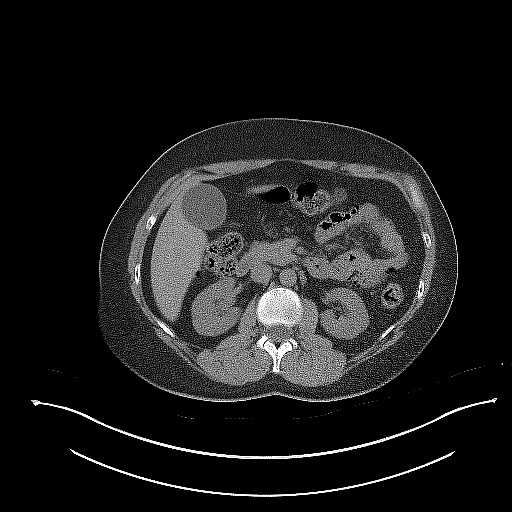
[im 5/31  bone]
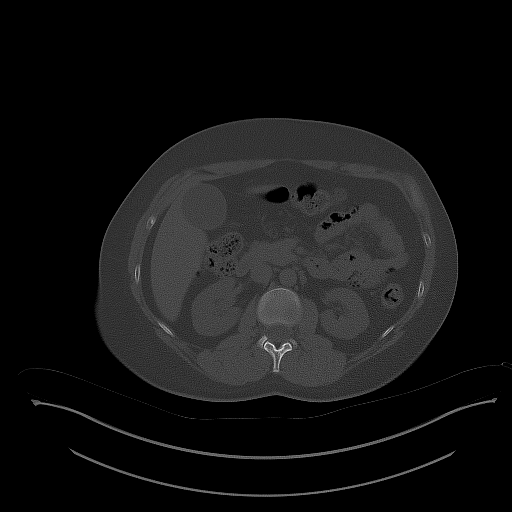
[im 9/31  soft-tissue]
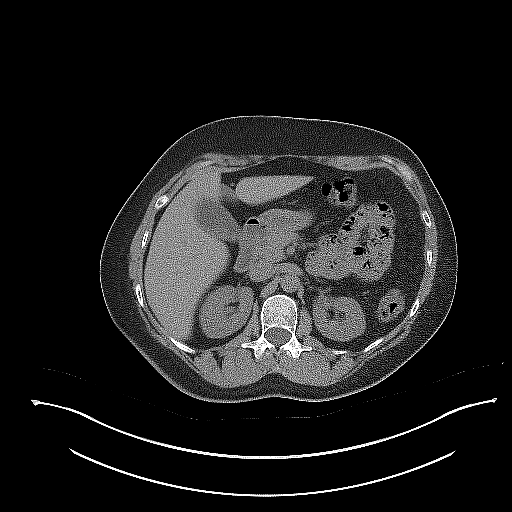
[im 13/31  soft-tissue]
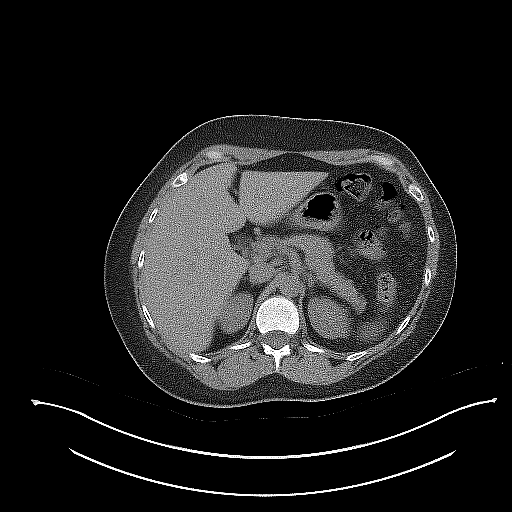
[im 13/31  lung]
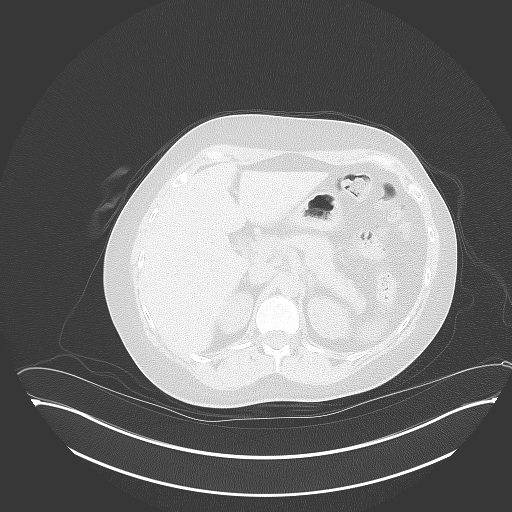
[im 18/31  soft-tissue]
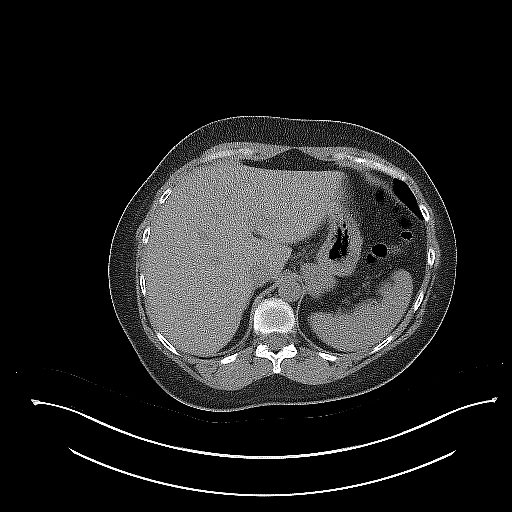
[im 18/31  lung]
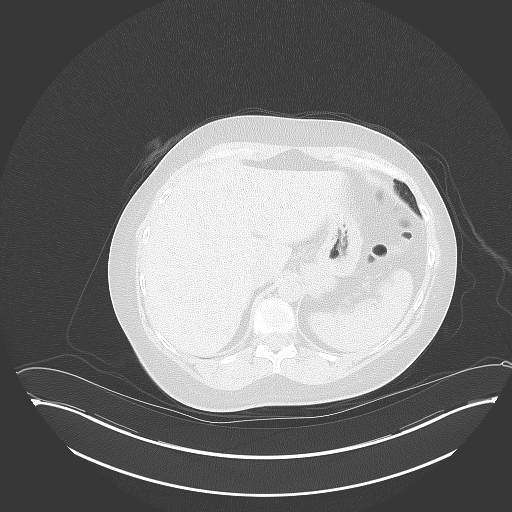
[im 22/31  soft-tissue]
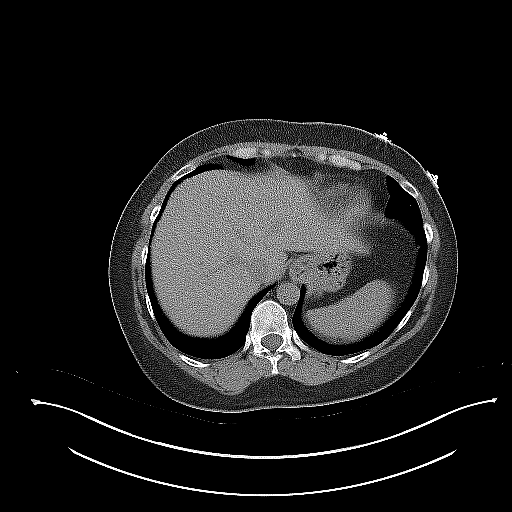
[im 22/31  lung]
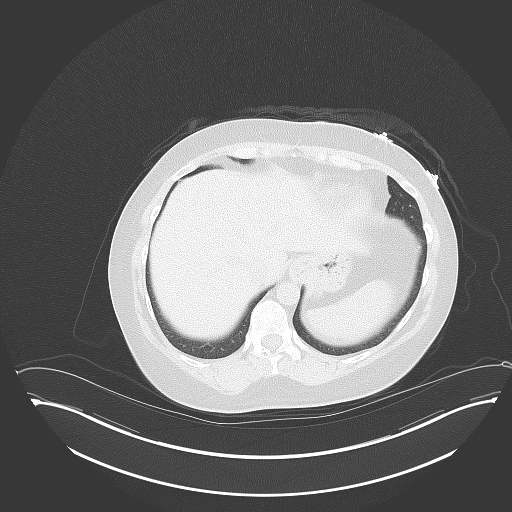
[im 26/31  soft-tissue]
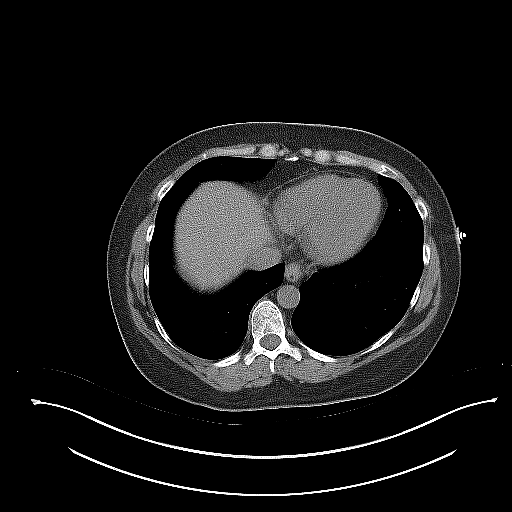
[im 26/31  lung]
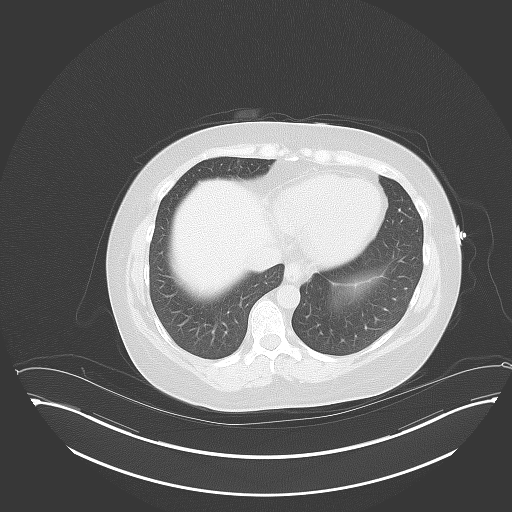

[Series 5: coronal · coronal · 0.68mm/px · 3 of 128 slices shown]
[im 43/128  soft-tissue]
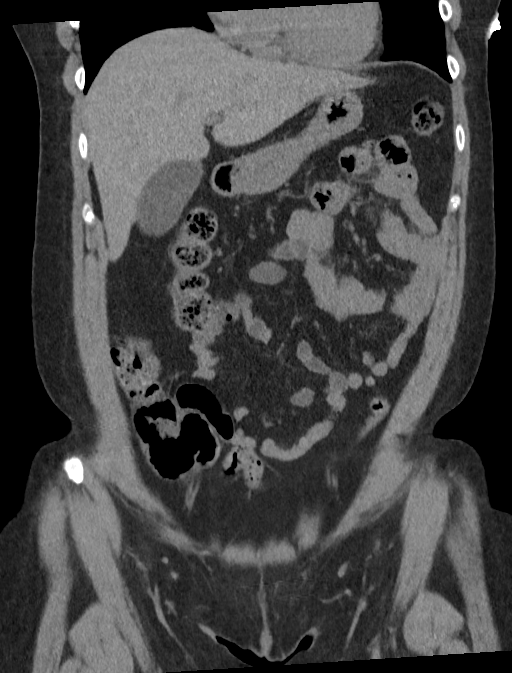
[im 57/128  soft-tissue]
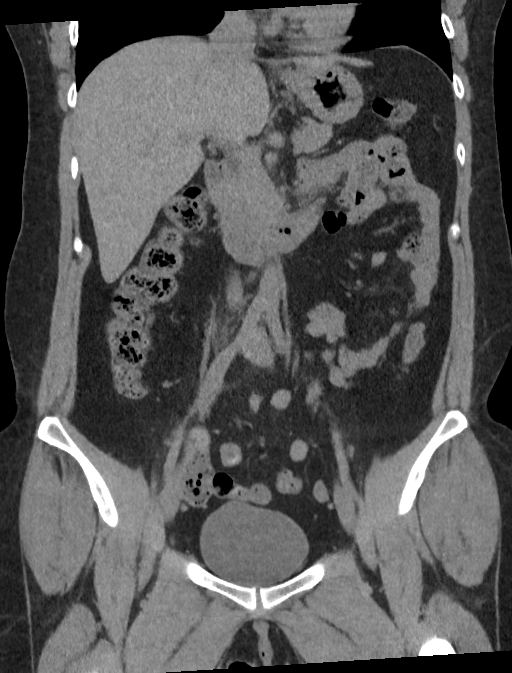
[im 71/128  soft-tissue]
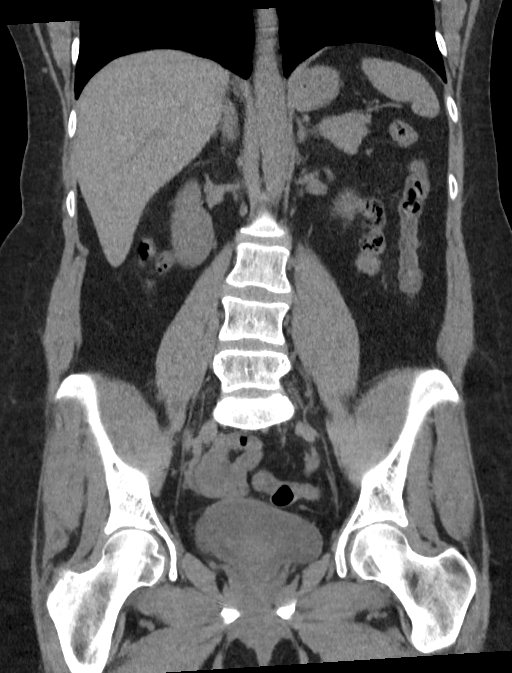

[Series 6: sagittal · sagittal · 0.49mm/px · 1 of 177 slices shown]
[im 59/177  soft-tissue]
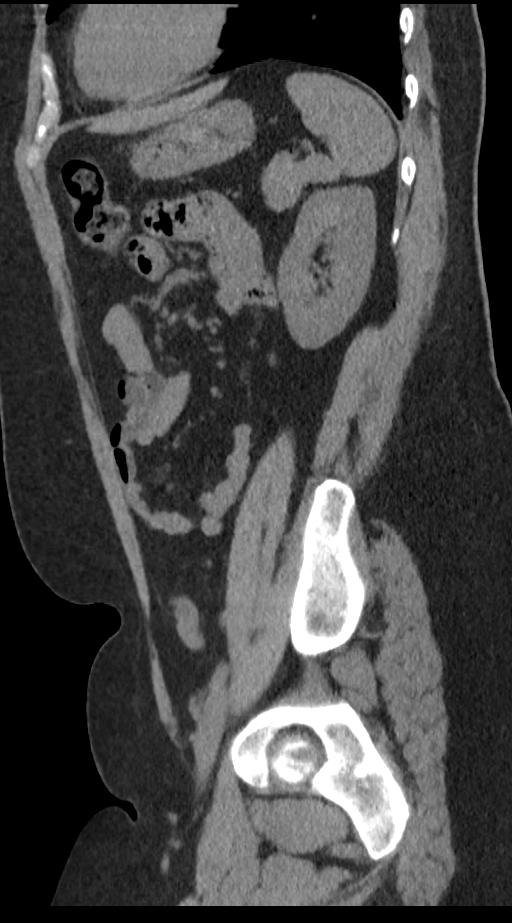

[10 of 46 positions shown; findings below may reference images not displayed]

FINDINGS: Lower chest: Insert lung bases

Hepatobiliary: No hepatic lesions are identified without contrast.
Dependent high attenuation material in the gallbladder likely
gallstones and gallbladder sludge. No findings for acute
cholecystitis. No common bile duct dilatation.

Pancreas: No mass, inflammation or ductal dilatation.

Spleen: Normal size. No focal lesions.

Adrenals/Urinary Tract: Adrenal glands are normal.

Lower pole left renal calculus measuring 4 mm.

No right-sided renal calculi are identified. No obstructing ureteral
calculi or bladder calculi.

Stomach/Bowel: The stomach, duodenum, small bowel and colon are
grossly normal without oral contrast. No acute inflammatory process,
mass lesions or obstructive findings.

Vascular/Lymphatic: The aorta is normal in caliber. No
atheroscerlotic calcifications. No mesenteric of retroperitoneal
mass or adenopathy. Small scattered lymph nodes are noted.

Reproductive: The uterus and ovaries are unremarkable.

Other: No pelvic mass or adenopathy. No free pelvic fluid
collections. No inguinal mass or adenopathy. No abdominal wall
hernia or subcutaneous lesions.

Musculoskeletal: No signal bony findings.
IMPRESSION: 1. 4 mm lower pole left renal calculus but no obstructing ureteral
calculi or bladder calculi.
2. No acute abdominal/pelvic findings, mass lesions or adenopathy.
3. Suspect cholelithiasis and gallbladder sludge.

## 2020-09-08 IMAGING — US US ABDOMEN LIMITED
1 series · 14 of 25 positions shown · non-contrast
Comparison: CT [DATE].

CLINICAL DATA: Right upper quadrant pain.

EXAM:
ULTRASOUND ABDOMEN LIMITED RIGHT UPPER QUADRANT

[Series 1: us abdomen limited ruq (liver/gb) · 14 of 38 slices shown]
[im 1/38]
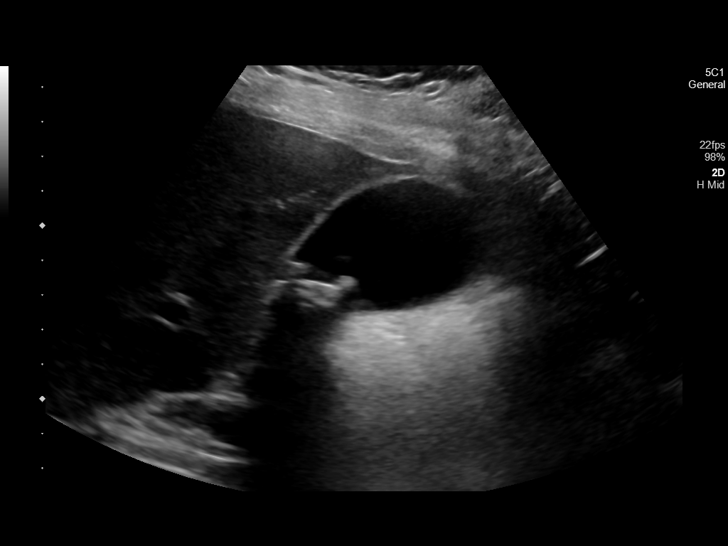
[im 4/38]
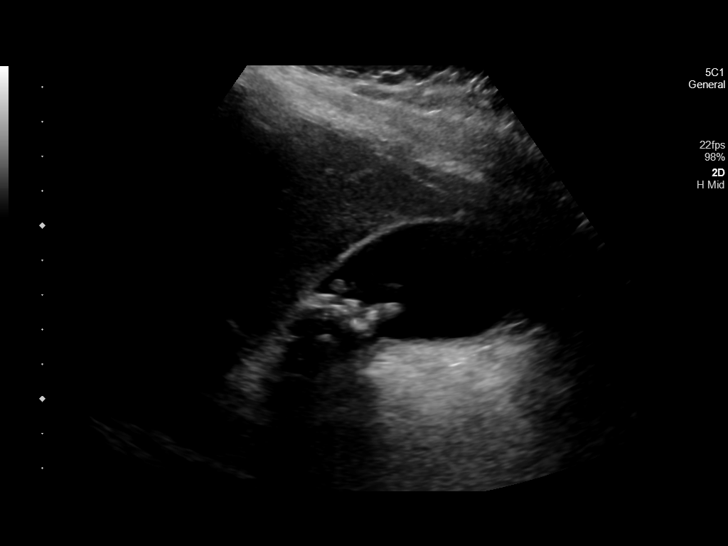
[im 7/38]
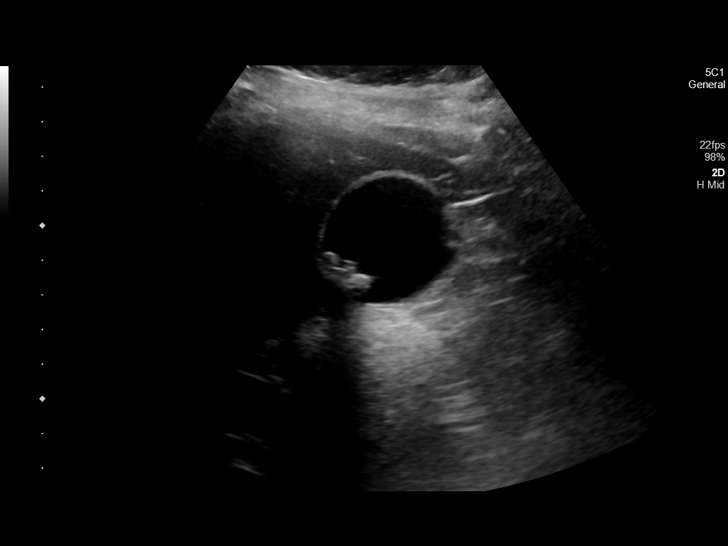
[im 10/38]
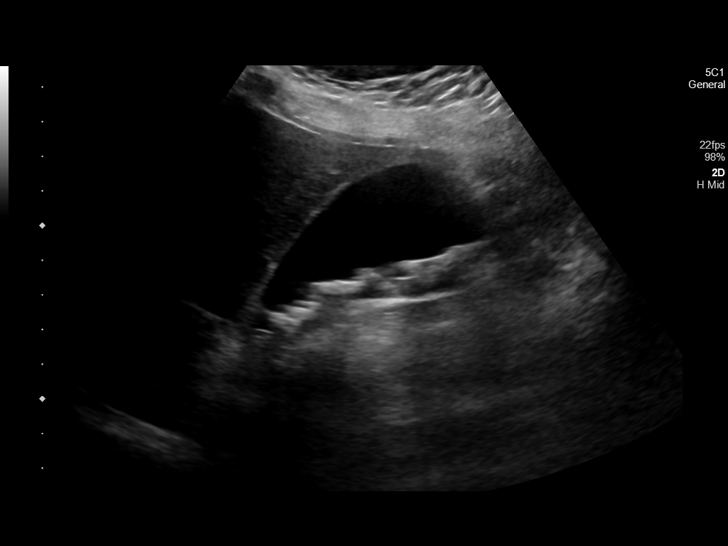
[im 13/38]
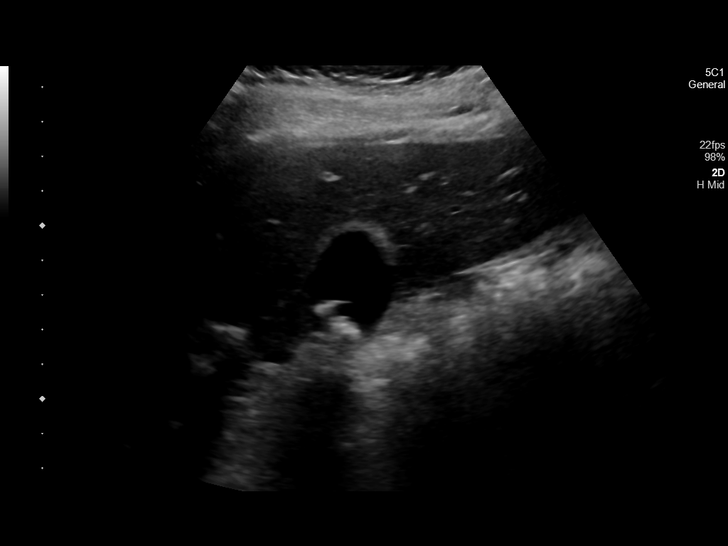
[im 14/38]
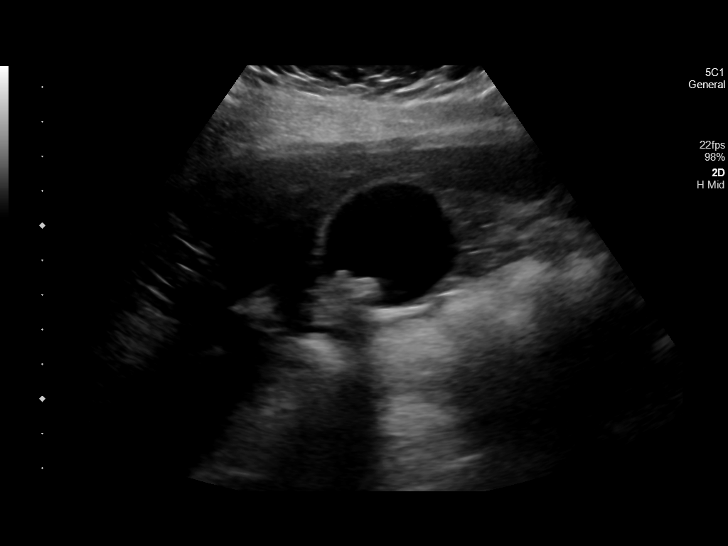
[im 17/38]
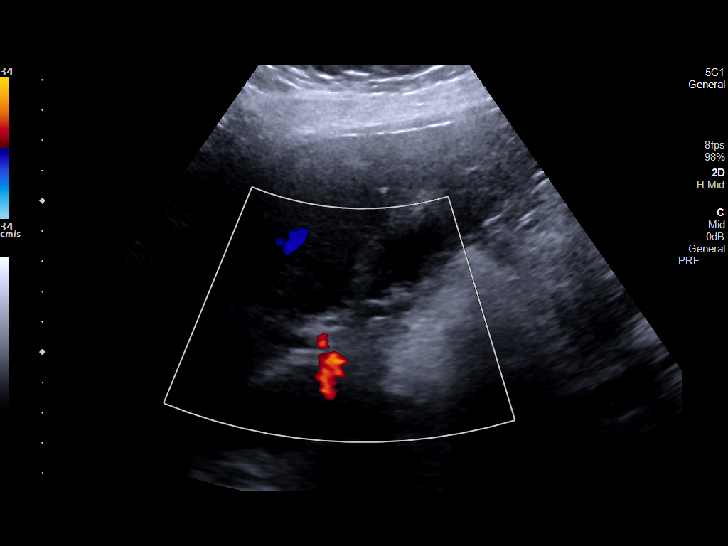
[im 21/38]
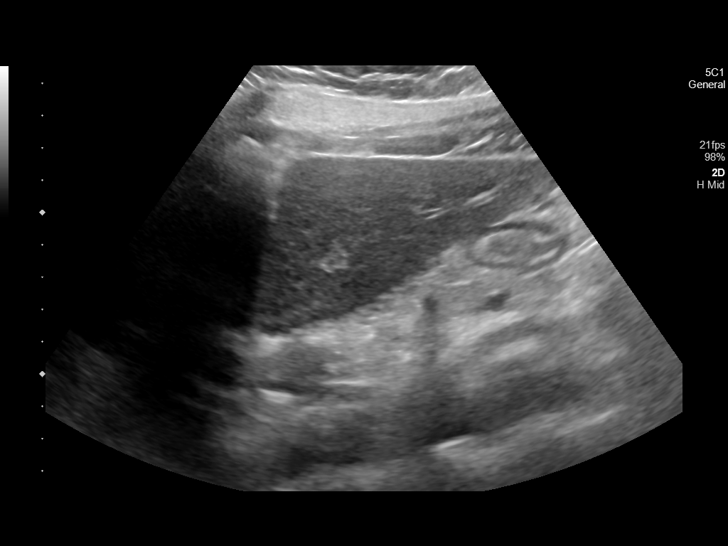
[im 24/38]
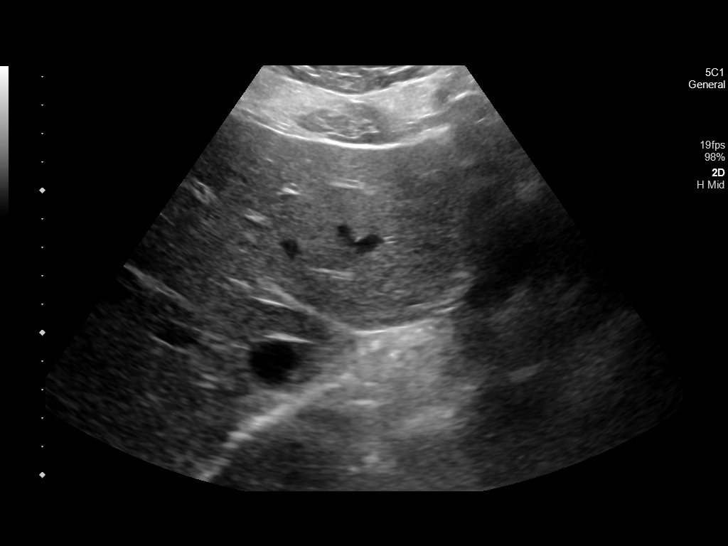
[im 25/38]
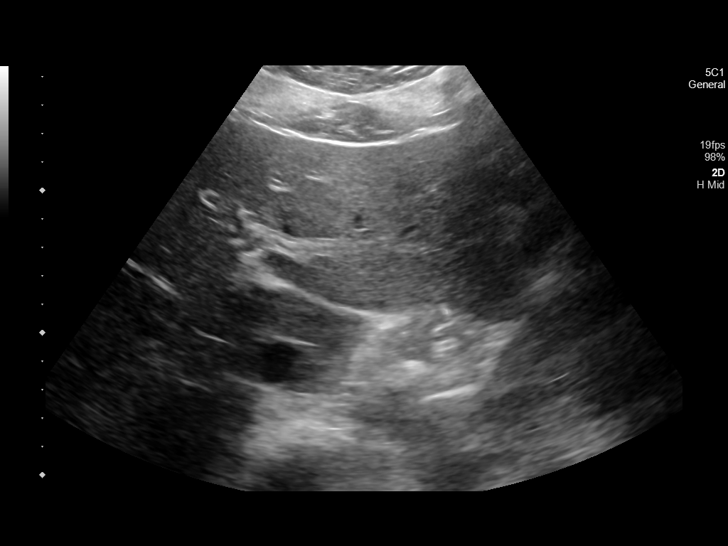
[im 28/38]
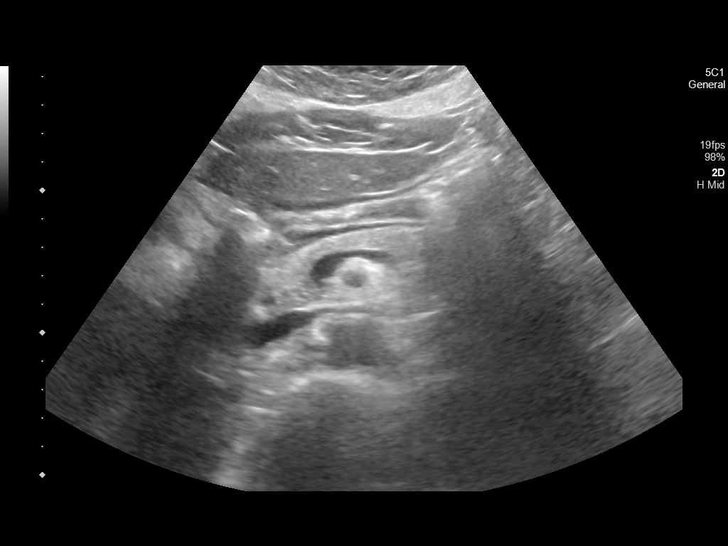
[im 31/38]
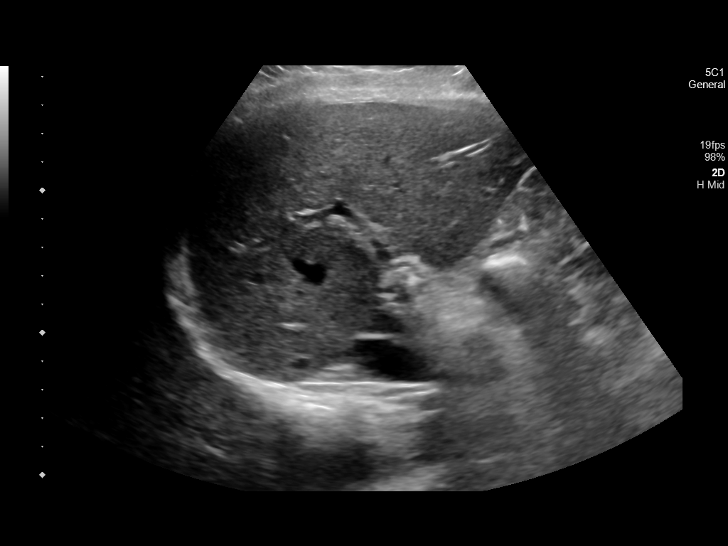
[im 34/38]
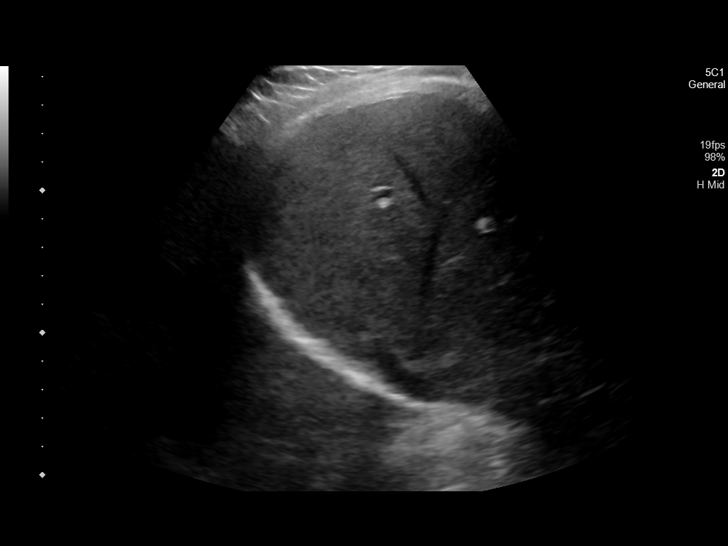
[im 38/38]
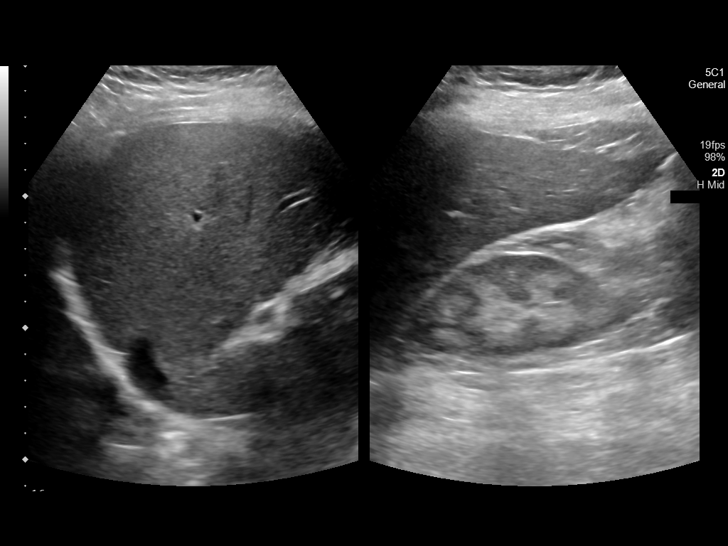

[14 of 25 positions shown; findings below may reference images not displayed]

FINDINGS: Gallbladder:

Multiple gallstones, the largest measures 1 cm.

Common bile duct:

Diameter: 3 mm

Liver:

No focal lesion identified. Within normal limits in parenchymal
echogenicity. Portal vein is patent on color Doppler imaging with
normal direction of blood flow towards the liver.

Other: None.
IMPRESSION: Cholelithiasis.  No evidence of cholecystitis or biliary distention.

## 2020-09-08 MED ORDER — SODIUM CHLORIDE 0.9 % IV SOLN: INTRAVENOUS | Status: DC

## 2020-09-08 MED ORDER — HYDRALAZINE HCL 25 MG PO TABS: 25.0000 mg | ORAL_TABLET | Freq: Three times a day (TID) | ORAL | Status: DC | PRN

## 2020-09-08 MED ORDER — CHLORTHALIDONE 25 MG PO TABS
12.5000 mg | ORAL_TABLET | Freq: Every day | ORAL | Status: DC
  Administered 2020-09-09 – 2020-09-10 (×3): 12.5 mg via ORAL
  Filled 2020-09-08 (×4): qty 0.5

## 2020-09-08 MED ORDER — DOCUSATE SODIUM 100 MG PO CAPS: 100.0000 mg | ORAL_CAPSULE | Freq: Two times a day (BID) | ORAL | Status: DC | PRN

## 2020-09-08 MED ORDER — UBROGEPANT 100 MG PO TABS: 1.0000 | ORAL_TABLET | Freq: Every day | ORAL | Status: DC | PRN

## 2020-09-08 MED ORDER — ATENOLOL 25 MG PO TABS
25.0000 mg | ORAL_TABLET | Freq: Every day | ORAL | Status: DC
  Administered 2020-09-09 – 2020-09-10 (×3): 25 mg via ORAL
  Filled 2020-09-08 (×3): qty 1

## 2020-09-08 MED ORDER — FENTANYL CITRATE (PF) 100 MCG/2ML IJ SOLN
50.0000 ug | Freq: Once | INTRAMUSCULAR | Status: AC
  Administered 2020-09-08: 50 ug via INTRAVENOUS

## 2020-09-08 MED ORDER — MORPHINE SULFATE (PF) 4 MG/ML IV SOLN
4.0000 mg | Freq: Once | INTRAVENOUS | Status: AC
  Administered 2020-09-08: 4 mg via INTRAVENOUS
  Filled 2020-09-08: qty 1

## 2020-09-08 MED ORDER — ONDANSETRON 4 MG PO TBDP
4.0000 mg | ORAL_TABLET | Freq: Four times a day (QID) | ORAL | Status: DC | PRN
  Administered 2020-09-08: 4 mg via ORAL
  Filled 2020-09-08: qty 1

## 2020-09-08 MED ORDER — HYDROCODONE-ACETAMINOPHEN 5-325 MG PO TABS
1.0000 | ORAL_TABLET | ORAL | Status: DC | PRN
  Administered 2020-09-08 (×3): 1 via ORAL
  Administered 2020-09-09 – 2020-09-10 (×4): 2 via ORAL
  Administered 2020-09-10 (×2): 1 via ORAL
  Filled 2020-09-08 (×3): qty 1
  Filled 2020-09-08: qty 2
  Filled 2020-09-08: qty 1
  Filled 2020-09-08 (×2): qty 2
  Filled 2020-09-08: qty 1
  Filled 2020-09-08: qty 2

## 2020-09-08 MED ORDER — ONDANSETRON HCL 4 MG/2ML IJ SOLN
4.0000 mg | Freq: Once | INTRAMUSCULAR | Status: AC
  Administered 2020-09-08: 4 mg via INTRAVENOUS
  Filled 2020-09-08: qty 2

## 2020-09-08 MED ORDER — ACETAMINOPHEN 500 MG PO TABS
1000.0000 mg | ORAL_TABLET | Freq: Once | ORAL | Status: AC
  Administered 2020-09-08: 1000 mg via ORAL
  Filled 2020-09-08: qty 2

## 2020-09-08 MED ORDER — ONDANSETRON HCL 4 MG/2ML IJ SOLN
4.0000 mg | Freq: Four times a day (QID) | INTRAMUSCULAR | Status: DC | PRN
  Administered 2020-09-08: 4 mg via INTRAVENOUS
  Filled 2020-09-08: qty 2

## 2020-09-08 MED ORDER — PANTOPRAZOLE SODIUM 40 MG IV SOLR
40.0000 mg | Freq: Every day | INTRAVENOUS | Status: DC
  Administered 2020-09-08 – 2020-09-09 (×2): 40 mg via INTRAVENOUS
  Filled 2020-09-08 (×2): qty 40

## 2020-09-08 MED ORDER — MORPHINE SULFATE (PF) 4 MG/ML IV SOLN
4.0000 mg | INTRAVENOUS | Status: DC | PRN
  Administered 2020-09-08: 4 mg via INTRAVENOUS
  Filled 2020-09-08: qty 1

## 2020-09-08 MED ORDER — VENLAFAXINE HCL ER 75 MG PO CP24
75.0000 mg | ORAL_CAPSULE | Freq: Every day | ORAL | Status: DC
  Administered 2020-09-09 – 2020-09-10 (×2): 75 mg via ORAL
  Filled 2020-09-08 (×3): qty 1

## 2020-09-08 MED ORDER — PIPERACILLIN-TAZOBACTAM 3.375 G IVPB
3.3750 g | Freq: Three times a day (TID) | INTRAVENOUS | Status: DC
  Administered 2020-09-08 – 2020-09-09 (×3): 3.375 g via INTRAVENOUS
  Filled 2020-09-08 (×3): qty 50

## 2020-09-08 MED ORDER — FENTANYL CITRATE (PF) 100 MCG/2ML IJ SOLN
INTRAMUSCULAR | Status: AC
  Filled 2020-09-08: qty 2

## 2020-09-08 MED ORDER — PIPERACILLIN-TAZOBACTAM 3.375 G IVPB 30 MIN
3.3750 g | Freq: Once | INTRAVENOUS | Status: AC
  Administered 2020-09-08: 3.375 g via INTRAVENOUS
  Filled 2020-09-08: qty 50

## 2020-09-08 MED ORDER — TRAMADOL HCL 50 MG PO TABS: 50.0000 mg | ORAL_TABLET | Freq: Four times a day (QID) | ORAL | Status: DC | PRN

## 2020-09-08 MED ORDER — ONDANSETRON HCL 4 MG/2ML IJ SOLN: 4.0000 mg | Freq: Four times a day (QID) | INTRAMUSCULAR | Status: DC | PRN

## 2020-09-08 MED ORDER — ATENOLOL-CHLORTHALIDONE 50-25 MG PO TABS
0.5000 | ORAL_TABLET | Freq: Every day | ORAL | Status: DC
Start: 2020-09-08 — End: 2020-09-08

## 2020-09-08 MED ORDER — AMLODIPINE BESYLATE 5 MG PO TABS
2.5000 mg | ORAL_TABLET | Freq: Every day | ORAL | Status: DC
  Administered 2020-09-08: 5 mg via ORAL
  Administered 2020-09-09: 2.5 mg via ORAL
  Filled 2020-09-08 (×2): qty 1

## 2020-09-08 MED ORDER — ONDANSETRON 4 MG PO TBDP: 4.0000 mg | ORAL_TABLET | Freq: Once | ORAL | Status: DC

## 2020-09-08 MED ORDER — SIMETHICONE 80 MG PO CHEW
40.0000 mg | CHEWABLE_TABLET | Freq: Four times a day (QID) | ORAL | Status: DC | PRN
  Filled 2020-09-08: qty 1

## 2020-09-08 NOTE — ED Triage Notes (Signed)
EMS brings pt in from home for c/o abd pain; pt st awoke with mid upper abd pain radiating around into back accomp by nausea; denies hx of same

## 2020-09-08 NOTE — ED Notes (Signed)
Patient being transported to floor at this time on stretcher with float RN. Floor RN made aware.

## 2020-09-08 NOTE — ED Notes (Signed)
VS obtained by this RN. Pt states Fentanyl ineffective at pain control, pt states "I'm trying to stay awake because of all of this", pt however requesting medication to put her to sleep. This RN spoke with EDP regarding pt request, no new orders. This RN updated patient regarding plan of care. Pt states understanding. [place] take [place] to [place] at this time.

## 2020-09-08 NOTE — H&P (Signed)
Subjective:   CC: RUQ pain  HPI:  Hannah Gregory is a 39 y.o. female who is consulted by Vicente Males for evaluation of above cc.  Symptoms were first noted 1 day ago. Pain is sharp, starts in epigastric wraps around to the back.  Associated with nothing specific, exacerbated by nothing specific.  First time having such an episode.  Does have an appetite     Past Medical History:  has a past medical history of Family history of breast cancer, Migraine, and Scoliosis.  Past Surgical History: None reported  Family History: family history includes Asthma in her mother; Breast cancer in her maternal aunt, maternal aunt, and maternal grandmother; COPD in her brother; Emphysema in her brother; Heart attack in her father; Hyperlipidemia in her father and mother; Hypertension in her brother, father, and mother; Mental retardation in her brother; Migraines in her father; Thyroid disease in her mother.  Social History:  reports that she has been smoking cigarettes. She started smoking about 22 years ago. She has a 5.50 pack-year smoking history. She has never used smokeless tobacco. She reports current alcohol use. She reports that she does not use drugs.  Current Medications:  Prior to Admission medications   Medication Sig Start Date End Date Taking? Authorizing Provider  amLODipine (NORVASC) 2.5 MG tablet Take 1-2 tablets (2.5-5 mg total) by mouth daily. Patient taking differently: Take 2.5 mg by mouth every evening. 08/10/20  Yes Sowles, Danna Hefty, MD  ascorbic acid (VITAMIN C) 500 MG tablet Take 500 mg by mouth daily.   Yes [provider]  atenolol-chlorthalidone (TENORETIC) 50-25 MG tablet Take 0.5-1 tablets by mouth daily. Patient taking differently: Take 0.5 tablets by mouth daily. 08/10/20  Yes Sowles, Danna Hefty, MD  cholecalciferol (VITAMIN D3) 25 MCG (1000 UNIT) tablet Take 2,000 Units by mouth daily.   Yes [provider]  cyanocobalamin (,VITAMIN B-12,) 1000 MCG/ML injection  INJECT 1ML=1000MCG INTRAMUSCULARLY EVERY MONTH 07/28/20  Yes Sowles, Danna Hefty, MD  Fremanezumab-vfrm (AJOVY) 225 MG/1.5ML SOAJ Inject into the skin. 12/02/19  Yes [provider]  hydrALAZINE (APRESOLINE) 25 MG tablet Take 1 tablet (25 mg total) by mouth 3 (three) times daily. Prn bp above 150/90 Patient taking differently: Take 25 mg by mouth 3 (three) times daily as needed (BP above 150/90). 05/09/20  Yes Sowles, Danna Hefty, MD  Ubrogepant (UBRELVY) 100 MG TABS Take 1 tablet by mouth daily as needed. May take 2 hours later if no improvement 09/10/19  Yes Sowles, Danna Hefty, MD  valACYclovir (VALTREX) 1000 MG tablet Take 2 tablets every 12 hours for 2 doses total, then stop taking.  Can take in the future if symptoms of cold sores just beginning to help blunt the response Patient taking differently: as needed (cold sores). Take 2 tablets every 12 hours for 2 doses total, then stop taking.  Can take in the future if symptoms of cold sores just beginning to help blunt the response 09/07/20  Yes Welford Roche D, MD  venlafaxine XR (EFFEXOR-XR) 75 MG 24 hr capsule TAKE 1 CAPSULE BY MOUTH ONCE DAILY W/ BREAKFAST *TAKE WITH FOOD* *DO NOT CRUSH OR CHEW* 07/28/20  Yes Sowles, Danna Hefty, MD  vitamin B-12 (CYANOCOBALAMIN) 1000 MCG tablet Take 1,000 mcg by mouth daily.   Yes [provider]    Allergies:  Allergies as of 09/08/2020 - Review Complete 09/08/2020  Allergen Reaction Noted  . Imitrex [sumatriptan] Other (See Comments) 06/11/2018  . Chantix [varenicline tartrate]  03/26/2018  . Prednisone Hives and Other (See Comments) 11/29/2016  ROS:  General: Denies weight loss, weight gain, fatigue, fevers, chills, and night sweats. Eyes: Denies blurry vision, double vision, eye pain, itchy eyes, and tearing. Ears: Denies hearing loss, earache, and ringing in ears. Nose: Denies sinus pain, congestion, infections, runny nose, and nosebleeds. Mouth/throat: Denies hoarseness, sore throat,  bleeding gums, and difficulty swallowing. Heart: Denies chest pain, palpitations, racing heart, irregular heartbeat, leg pain or swelling, and decreased activity tolerance. Respiratory: Denies breathing difficulty, shortness of breath, wheezing, cough, and sputum. GI: Denies change in appetite, heartburn, nausea, vomiting, constipation, diarrhea, and blood in stool. GU: Denies difficulty urinating, pain with urinating, urgency, frequency, blood in urine. Musculoskeletal: Denies joint stiffness, pain, swelling, muscle weakness. Skin: Denies rash, itching, mass, tumors, sores, and boils Neurologic: Denies headache, fainting, dizziness, seizures, numbness, and tingling. Psychiatric: Denies depression, anxiety, difficulty sleeping, and memory loss. Endocrine: Denies heat or cold intolerance, and increased thirst or urination. Blood/lymph: Denies easy bruising, and swollen glands     Objective:     BP (!) 165/92 (BP Location: Left Arm)   Pulse 70   Temp 98 F (36.7 C) (Oral)   Resp 18   Ht 5\' 5"  (1.651 m)   Wt 83.9 kg   LMP 08/18/2020 (Exact Date)   SpO2 98%   BMI 30.79 kg/m    Constitutional :  alert, cooperative, appears stated age and no distress  Lymphatics/Throat:  no asymmetry, masses, or scars  Respiratory:  clear to auscultation bilaterally  Cardiovascular:  regular rate and rhythm  Gastrointestinal: Soft, no guarding, focal tenderness to palpation epigastric and right upper quadrant.   Musculoskeletal: Steady movement  Skin: Cool and moist.  Psychiatric: Normal affect, non-agitated, not confused       LABS:  CMP Latest Ref Rng & Units 09/08/2020 05/03/2020 04/06/2020  Glucose 70 - 99 mg/dL 06/07/2020) 76 79  BUN 6 - 20 mg/dL 20 13 12   Creatinine 0.44 - 1.00 mg/dL 725(D 6.64  Sodium 135 - 145 mmol/L 135 138 139  Potassium 3.5 - 5.1 mmol/L 3.5 4.0 4.8  Chloride 98 - 111 mmol/L 106 106 106  CO2 22 - 32 mmol/L 19(L) 26 27  Calcium 8.9 - 10.3 mg/dL 4.03 8.9 9.2  Total  Protein 6.5 - 8.1 g/dL 7.7 6.9 -  Total Bilirubin 0.3 - 1.2 mg/dL 0.9 1.1 -  Alkaline Phos 38 - 126 U/L 97 - -  AST 15 - 41 U/L 24 14 -  ALT 0 - 44 U/L 21 9 -   CBC Latest Ref Rng & Units 09/08/2020 05/03/2020 06/12/2019  WBC 4.0 - 10.5 K/uL 16.5(H) 8.6 6.8  Hemoglobin 12.0 - 15.0 g/dL 07/03/2020 08/12/2019 56.3  Hematocrit 36.0 - 46.0 % 39.3 41.6 38.2  Platelets 150 - 400 K/uL 295 292 293     RADS: CLINICAL DATA:  Woke up with mid abdominal and back pain.  EXAM: CT ABDOMEN AND PELVIS WITHOUT CONTRAST  TECHNIQUE: Multidetector CT imaging of the abdomen and pelvis was performed following the standard protocol without IV contrast.  COMPARISON:  CT scan 04/06/2020  FINDINGS: Lower chest: Insert lung bases  Hepatobiliary: No hepatic lesions are identified without contrast. Dependent high attenuation material in the gallbladder likely gallstones and gallbladder sludge. No findings for acute cholecystitis. No common bile duct dilatation.  Pancreas: No mass, inflammation or ductal dilatation.  Spleen: Normal size. No focal lesions.  Adrenals/Urinary Tract: Adrenal glands are normal.  Lower pole left renal calculus measuring 4 mm.  No right-sided renal calculi are identified. No  obstructing ureteral calculi or bladder calculi.  Stomach/Bowel: The stomach, duodenum, small bowel and colon are grossly normal without oral contrast. No acute inflammatory process, mass lesions or obstructive findings.  Vascular/Lymphatic: The aorta is normal in caliber. No atheroscerlotic calcifications. No mesenteric of retroperitoneal mass or adenopathy. Small scattered lymph nodes are noted.  Reproductive: The uterus and ovaries are unremarkable.  Other: No pelvic mass or adenopathy. No free pelvic fluid collections. No inguinal mass or adenopathy. No abdominal wall hernia or subcutaneous lesions.  Musculoskeletal: No signal bony findings.  IMPRESSION: 1. 4 mm lower pole left renal  calculus but no obstructing ureteral calculi or bladder calculi. 2. No acute abdominal/pelvic findings, mass lesions or adenopathy. 3. Suspect cholelithiasis and gallbladder sludge.   Electronically Signed   By: Rudie Meyer M.D.   On: 09/08/2020 06:37  CLINICAL DATA:  Right upper quadrant pain.  EXAM: ULTRASOUND ABDOMEN LIMITED RIGHT UPPER QUADRANT  COMPARISON:  CT 04/06/2020.  FINDINGS: Gallbladder:  Multiple gallstones, the largest measures 1 cm.  Common bile duct:  Diameter: 3 mm  Liver:  No focal lesion identified. Within normal limits in parenchymal echogenicity. Portal vein is patent on color Doppler imaging with normal direction of blood flow towards the liver.  Other: None.  IMPRESSION: Cholelithiasis.  No evidence of cholecystitis or biliary distention.   Electronically Signed   By: Maisie Fus  Register   On: 09/08/2020 08:18  Assessment:      Right upper quadrant pain.  Imaging studies with no signs of acute cholecystitis but the patient does have leukocytosis.  Plan:     Discussed proceeding with diagnostic laparoscopy possible cholecystectomy or HIDA scan to see if gallbladder has any signs of acute cholecystitis.  Pain quality of sharp pain wrapping around from the midline to the back is not quite the typical pain pattern for gallbladder disease.  With the intensity of the pain that the patient experiencing during clinical exam, some imaging changes will be expected.  No sign of skin rash so shingles is less likely but will also consider as a differential patient and mother at bedside requested HIDA scan.  We will admit for pain control continue clear liquid diet, started on antibiotics, pending HIDA scan.

## 2020-09-08 NOTE — ED Notes (Signed)
Pt presents to ED with c/o of ABD pain that "wraps around to her back". Pt states sudden onset last night that awoke her from her sleep. Pt denies HX of gallbladder issues, pt states HX of kidney stones but does endorse it does not feel the same. Pt denies V/D and does have c/o of nausea due to pain. Pt states she arrived via EMS, IV in place at this time, pt states she was given IVP meds in triage for pain. Pt states pain is currently a 6/10. NAD noted.

## 2020-09-08 NOTE — ED Notes (Signed)
Patient ambulated to and from bathroom with independent and steady gait.  

## 2020-09-08 NOTE — ED Notes (Signed)
VS obtained by this RN at this time. Pt visualized in NAD. Pt requesting Tylenol for pain at this time, explained would have to speak with EDP.

## 2020-09-08 NOTE — ED Provider Notes (Signed)
[facility] Emergency Department Provider Note   ____________________________________________   Event Date/Time   First MD Initiated Contact with Patient 09/08/20 1154     (approximate)  I have reviewed the triage vital signs and the nursing notes.   HISTORY  Chief Complaint Abdominal Pain    HPI Hannah Gregory is a 39 y.o. female with a stated past medical history of migraines who presents for right upper quadrant abdominal pain that she states wraps around to her back as well as up into her right shoulder.  Patient states that this pain began approximately 2 hours prior to arrival and has been worsening since onset.  Patient also describes nausea without any episodes of emesis.  Patient denies any history of similar symptoms in the past.  Patient denies any stated past medical history of gallstones however she does endorse past medical history of kidney stones and believes that this pain is similar to previous kidney stones that she has had in the past.  Patient describes 10/10 right upper quadrant, sharp, worsening abdominal pain that radiates around to her back and up into her right shoulder and chest.  Denies any known exacerbating or relieving factors however patient has not tried to eat anything since this pain began.         Past Medical History:  Diagnosis Date  . Family history of breast cancer    10/20 genetic testing letter sent  . Migraine   . Scoliosis     Patient Active Problem List   Diagnosis Date Noted  . Abdominal pain 09/08/2020  . 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 B12 deficiency 12/11/2019  . Vitamin D deficiency 12/11/2019  . Family history of pernicious anemia 12/11/2019  . Migraine without aura and without status migrainosus, not intractable 10/14/2019  . Menometrorrhagia 06/17/2019  . Migraine with aura and without status migrainosus 11/29/2016  . Chronic daily headache 11/29/2016  . Neck pain 11/29/2016  . Scoliosis of  thoracolumbar spine 11/29/2016  . Mild depression (HCC) 11/29/2016  . History of anxiety 11/29/2016    History reviewed. No pertinent surgical history.  Prior to Admission medications   Medication Sig Start Date End Date Taking? Authorizing Provider  amLODipine (NORVASC) 2.5 MG tablet Take 1-2 tablets (2.5-5 mg total) by mouth daily. 08/10/20   Alba Cory, MD  ascorbic acid (VITAMIN C) 500 MG tablet Take by mouth.    [provider]  atenolol-chlorthalidone (TENORETIC) 50-25 MG tablet Take 0.5-1 tablets by mouth daily. 08/10/20   Alba Cory, MD  cholecalciferol (VITAMIN D3) 25 MCG (1000 UNIT) tablet Take 1,000 Units by mouth daily.    [provider]  cyanocobalamin (,VITAMIN B-12,) 1000 MCG/ML injection INJECT 1ML=1000MCG INTRAMUSCULARLY EVERY MONTH 07/28/20   Sowles, Danna Hefty, MD  Fremanezumab-vfrm (AJOVY) 225 MG/1.5ML SOAJ Inject into the skin. 12/02/19   [provider]  hydrALAZINE (APRESOLINE) 25 MG tablet Take 1 tablet (25 mg total) by mouth 3 (three) times daily. Prn bp above 150/90 05/09/20   Sowles, Danna Hefty, MD  SAFETY-LOK 3CC SYR 23GX1" 23G X 1" 3 ML MISC  12/11/18   [provider]  Ubrogepant (UBRELVY) 100 MG TABS Take 1 tablet by mouth daily as needed. May take 2 hours later if no improvement 09/10/19   Alba Cory, MD  valACYclovir (VALTREX) 1000 MG tablet Take 2 tablets every 12 hours for 2 doses total, then stop taking.  Can take in the future if symptoms of cold sores just beginning to help blunt the  response 09/07/20   Jamelle HaringHendrickson, Clifford D, MD  venlafaxine XR (EFFEXOR-XR) 75 MG 24 hr capsule TAKE 1 CAPSULE BY MOUTH ONCE DAILY W/ BREAKFAST *TAKE WITH FOOD* *DO NOT CRUSH OR CHEW* 07/28/20   Alba CorySowles, Krichna, MD  vitamin B-12 (CYANOCOBALAMIN) 1000 MCG tablet Take 1,000 mcg by mouth daily.    [provider]    Allergies Imitrex [sumatriptan], Chantix [varenicline tartrate], and Prednisone  Family History  Problem  Relation Age of Onset  . Asthma Mother   . Hypertension Mother   . Hyperlipidemia Mother   . Thyroid disease Mother   . Heart attack Father   . Migraines Father   . Hypertension Father   . Hyperlipidemia Father   . Mental retardation Brother   . COPD Brother   . Hypertension Brother   . Emphysema Brother   . Breast cancer Maternal Grandmother   . Breast cancer Maternal Aunt   . Breast cancer Maternal Aunt     Social History Social History   Tobacco Use  . Smoking status: Current Every Day Smoker    Packs/day: 0.25    Years: 22.00    Pack years: 5.50    Types: Cigarettes    Start date: 10/01/1997  . Smokeless tobacco: Never Used  Vaping Use  . Vaping Use: Never used  Substance Use Topics  . Alcohol use: Yes    Comment: occasionally  . Drug use: No    Review of Systems Constitutional: No fever/chills Eyes: No visual changes. ENT: No sore throat. Cardiovascular: Denies chest pain. Respiratory: Denies shortness of breath. Gastrointestinal: Endorses abdominal pain.  Endorses nausea, no vomiting.  No diarrhea. Genitourinary: Negative for dysuria. Musculoskeletal: Negative for acute arthralgias Skin: Negative for rash. Neurological: Negative for headaches, weakness/numbness/paresthesias in any extremity Psychiatric: Negative for suicidal ideation/homicidal ideation   ____________________________________________   PHYSICAL EXAM:  VITAL SIGNS: ED Triage Vitals  Enc Vitals Group     BP 09/08/20 0334 (!) 152/85     Pulse Rate 09/08/20 0334 (!) 59     Resp 09/08/20 0334 20     Temp 09/08/20 0334 98 F (36.7 C)     Temp Source 09/08/20 0334 Oral     SpO2 09/08/20 0334 100 %     Weight 09/08/20 0334 185 lb (83.9 kg)     Height 09/08/20 0334 5\' 5"  (1.651 m)     Head Circumference --      Peak Flow --      Pain Score 09/08/20 0336 10     Pain Loc --      Pain Edu? --      Excl. in GC? --    Constitutional: Alert and oriented. Well appearing and in no acute  distress. Eyes: Conjunctivae are normal. PERRL. Head: Atraumatic. Nose: No congestion/rhinnorhea. Mouth/Throat: Mucous membranes are moist. Neck: No stridor Cardiovascular: Grossly normal heart sounds.  Good peripheral circulation. Respiratory: Normal respiratory effort.  No retractions. Gastrointestinal: Soft and tender to elevation in the right upper quadrant. No distention. Musculoskeletal: No obvious deformities Neurologic:  Normal speech and language. No gross focal neurologic deficits are appreciated. Skin:  Skin is warm and dry. No rash noted. Psychiatric: Mood and affect are normal. Speech and behavior are normal.  ____________________________________________   LABS (all labs ordered are listed, but only abnormal results are displayed)  Labs Reviewed  CBC WITH DIFFERENTIAL/PLATELET - Abnormal; Notable for the following components:      Result Value   WBC 16.5 (*)    MCHC 36.6 (*)  Neutro Abs 12.4 (*)    All other components within normal limits  COMPREHENSIVE METABOLIC PANEL - Abnormal; Notable for the following components:   CO2 19 (*)    Glucose, Bld 136 (*)    All other components within normal limits  URINALYSIS, COMPLETE (UACMP) WITH MICROSCOPIC - Abnormal; Notable for the following components:   Color, Urine YELLOW (*)    APPearance HAZY (*)    Hgb urine dipstick SMALL (*)    Bacteria, UA RARE (*)    All other components within normal limits  RESP PANEL BY RT-PCR (FLU A&B, COVID) ARPGX2  LIPASE, BLOOD  POC URINE PREG, ED  TROPONIN I (HIGH SENSITIVITY)  TROPONIN I (HIGH SENSITIVITY)   ____________________________________________  EKG  ED ECG REPORT I, Merwyn Katos, the attending physician, personally viewed and interpreted this ECG.  Date: 09/08/2020 EKG Time: 0330 Rate: 61 Rhythm: normal sinus rhythm QRS Axis: normal Intervals: normal ST/T Wave abnormalities: normal Narrative Interpretation: no evidence of acute  ischemia  ____________________________________________  RADIOLOGY  ED MD interpretation: CT of the abdomen pelvis without contrast shows a 4 mm lower pole left renal calculus without any obstructing ureteral calculi or bladder calculi.  There is no acute intra-abdominal/pelvic findings.  There is cholelithiasis and gallbladder sludge suspected  Ultrasound of the right upper quadrant shows cholelithiasis without any evidence of cholecystitis or biliary distention  Official radiology report(s): CT Renal Stone Study  Result Date: 09/08/2020 CLINICAL DATA:  Woke up with mid abdominal and back pain. EXAM: CT ABDOMEN AND PELVIS WITHOUT CONTRAST TECHNIQUE: Multidetector CT imaging of the abdomen and pelvis was performed following the standard protocol without IV contrast. COMPARISON:  CT scan 04/06/2020 FINDINGS: Lower chest: Insert lung bases Hepatobiliary: No hepatic lesions are identified without contrast. Dependent high attenuation material in the gallbladder likely gallstones and gallbladder sludge. No findings for acute cholecystitis. No common bile duct dilatation. Pancreas: No mass, inflammation or ductal dilatation. Spleen: Normal size. No focal lesions. Adrenals/Urinary Tract: Adrenal glands are normal. Lower pole left renal calculus measuring 4 mm. No right-sided renal calculi are identified. No obstructing ureteral calculi or bladder calculi. Stomach/Bowel: The stomach, duodenum, small bowel and colon are grossly normal without oral contrast. No acute inflammatory process, mass lesions or obstructive findings. Vascular/Lymphatic: The aorta is normal in caliber. No atheroscerlotic calcifications. No mesenteric of retroperitoneal mass or adenopathy. Small scattered lymph nodes are noted. Reproductive: The uterus and ovaries are unremarkable. Other: No pelvic mass or adenopathy. No free pelvic fluid collections. No inguinal mass or adenopathy. No abdominal wall hernia or subcutaneous lesions.  Musculoskeletal: No signal bony findings. IMPRESSION: 1. 4 mm lower pole left renal calculus but no obstructing ureteral calculi or bladder calculi. 2. No acute abdominal/pelvic findings, mass lesions or adenopathy. 3. Suspect cholelithiasis and gallbladder sludge. Electronically Signed   By: Rudie Meyer M.D.   On: 09/08/2020 06:37   US ABDOMEN LIMITED RUQ (LIVER/GB)  Result Date: 09/08/2020 CLINICAL DATA:  Right upper quadrant pain. EXAM: ULTRASOUND ABDOMEN LIMITED RIGHT UPPER QUADRANT COMPARISON:  CT 04/06/2020. FINDINGS: Gallbladder: Multiple gallstones, the largest measures 1 cm. Common bile duct: Diameter: 3 mm Liver: No focal lesion identified. Within normal limits in parenchymal echogenicity. Portal vein is patent on color Doppler imaging with normal direction of blood flow towards the liver. Other: None. IMPRESSION: Cholelithiasis.  No evidence of cholecystitis or biliary distention. Electronically Signed   By: Maisie Fus  Register   On: 09/08/2020 08:18    ____________________________________________   PROCEDURES  Procedure(s) performed (  including Critical Care):  .1-3 Lead EKG Interpretation Performed by: Merwyn Katos, MD Authorized by: Merwyn Katos, MD     Interpretation: normal     ECG rate:  71   ECG rate assessment: normal     Rhythm: sinus rhythm     Ectopy: none     Conduction: normal       ____________________________________________   INITIAL IMPRESSION / ASSESSMENT AND PLAN / ED COURSE  As part of my medical decision making, I reviewed the following data within the electronic MEDICAL RECORD NUMBER Nursing notes reviewed and incorporated, Labs reviewed, EKG interpreted, Old chart reviewed, Radiograph reviewed and Notes from prior ED visits reviewed and incorporated        Patient is a 39 year old female with the above-stated past medical history who presents for right upper quadrant pain. Differential diagnosis includes but is not limited to: ascending  cholangitis, cholecystitis, kidney stones, mesenteric ischemia, appendicitis Laboratory evaluation significant for leukocytosis to 16 Given history, physical exam, leukocytosis, and cholelithiasis on imaging, I spoke with the on-call general surgeon, Dr. Tonna Boehringer, who agrees to accept this patient for admission for observation given possible early cholecystitis or other biliary infection.  Patient agrees to plan for admission      ____________________________________________   FINAL CLINICAL IMPRESSION(S) / ED DIAGNOSES  Final diagnoses:  RUQ abdominal pain     ED Discharge Orders    None       Note:  This document was prepared using Dragon voice recognition software and may include unintentional dictation errors.   Merwyn Katos, MD 09/08/20 1325

## 2020-09-08 NOTE — ED Notes (Signed)
Clear liquid diet ordered for pt. 

## 2020-09-08 NOTE — ED Notes (Signed)
Pt is kneeling in lobby floor, c/o increased pain & nausea; pt taken to triage for reassessment; Dr Don Perking to triage for evaluation

## 2020-09-09 ENCOUNTER — Inpatient Hospital Stay: Payer: Managed Care, Other (non HMO) | Admitting: Anesthesiology

## 2020-09-09 ENCOUNTER — Other Ambulatory Visit: Payer: Managed Care, Other (non HMO)

## 2020-09-09 ENCOUNTER — Encounter: Payer: Self-pay | Admitting: Surgery

## 2020-09-09 ENCOUNTER — Encounter
Admission: EM | Disposition: A | Discharge status: Home / Self Care | Payer: Self-pay | Source: Home / Self Care | Attending: Surgery

## 2020-09-09 LAB — CBC
HCT: 41.6 % (ref 36.0–46.0)
HCT: 42.3 % (ref 36.0–46.0)
Hemoglobin: 14.7 g/dL (ref 12.0–15.0)
Hemoglobin: 14.4 g/dL (ref 12.0–15.0)
MCH: 33.3 pg (ref 26.0–34.0)
MCH: 33 pg (ref 26.0–34.0)
MCHC: 35.3 g/dL (ref 30.0–36.0)
MCHC: 34 g/dL (ref 30.0–36.0)
MCV: 94.3 fL (ref 80.0–100.0)
MCV: 97 fL (ref 80.0–100.0)
Platelets: 316 10*3/uL (ref 150–400)
Platelets: 281 10*3/uL (ref 150–400)
RBC: 4.41 MIL/uL (ref 3.87–5.11)
RBC: 4.36 MIL/uL (ref 3.87–5.11)
RDW: 12.5 % (ref 11.5–15.5)
RDW: 12.5 % (ref 11.5–15.5)
WBC: 11.3 10*3/uL — ABNORMAL HIGH (ref 4.0–10.5)
WBC: 19.1 10*3/uL — ABNORMAL HIGH (ref 4.0–10.5)
nRBC: 0 % (ref 0.0–0.2)
nRBC: 0 % (ref 0.0–0.2)

## 2020-09-09 LAB — HEPATIC FUNCTION PANEL
ALT: 100 U/L — ABNORMAL HIGH (ref 0–44)
AST: 96 U/L — ABNORMAL HIGH (ref 15–41)
Albumin: 3.9 g/dL (ref 3.5–5.0)
Alkaline Phosphatase: 141 U/L — ABNORMAL HIGH (ref 38–126)
Bilirubin, Direct: 0.3 mg/dL — ABNORMAL HIGH (ref 0.0–0.2)
Indirect Bilirubin: 1.7 mg/dL — ABNORMAL HIGH (ref 0.3–0.9)
Total Bilirubin: 2 mg/dL — ABNORMAL HIGH (ref 0.3–1.2)
Total Protein: 7.5 g/dL (ref 6.5–8.1)

## 2020-09-09 LAB — BASIC METABOLIC PANEL
Anion gap: 10 (ref 5–15)
BUN: 12 mg/dL (ref 6–20)
CO2: 23 mmol/L (ref 22–32)
Calcium: 8.9 mg/dL (ref 8.9–10.3)
Chloride: 103 mmol/L (ref 98–111)
Creatinine, Ser: 0.97 mg/dL (ref 0.44–1.00)
GFR, Estimated: >60 mL/min (ref >60)
Glucose, Bld: 120 mg/dL — ABNORMAL HIGH (ref 70–99)
Potassium: 3.8 mmol/L (ref 3.5–5.1)
Sodium: 136 mmol/L (ref 135–145)

## 2020-09-09 LAB — HIV ANTIBODY (ROUTINE TESTING W REFLEX): HIV Screen 4th Generation wRfx: NONREACTIVE

## 2020-09-09 LAB — CREATININE, SERUM
Creatinine, Ser: 1.19 mg/dL — ABNORMAL HIGH (ref 0.44–1.00)
GFR, Estimated: >60 mL/min (ref >60)

## 2020-09-09 LAB — MAGNESIUM: Magnesium: 2 mg/dL (ref 1.7–2.4)

## 2020-09-09 LAB — PHOSPHORUS: Phosphorus: 2.7 mg/dL (ref 2.5–4.6)

## 2020-09-09 SURGERY — CHOLECYSTECTOMY, ROBOT-ASSISTED, LAPAROSCOPIC
Anesthesia: General

## 2020-09-09 MED ORDER — HYDROCODONE-ACETAMINOPHEN 5-325 MG PO TABS: 1.0000 | ORAL_TABLET | Freq: Four times a day (QID) | ORAL | 0 refills | Status: DC | PRN

## 2020-09-09 MED ORDER — INDOCYANINE GREEN 25 MG IV SOLR
1.2500 mg | Freq: Once | INTRAVENOUS | Status: AC
  Administered 2020-09-09: 1.25 mg via INTRAVENOUS

## 2020-09-09 MED ORDER — BUPIVACAINE HCL (PF) 0.5 % IJ SOLN
INTRAMUSCULAR | Status: AC
  Filled 2020-09-09: qty 30

## 2020-09-09 MED ORDER — MORPHINE SULFATE (PF) 2 MG/ML IV SOLN: 1.0000 mg | INTRAVENOUS | Status: DC | PRN

## 2020-09-09 MED ORDER — SUCCINYLCHOLINE CHLORIDE 200 MG/10ML IV SOSY
PREFILLED_SYRINGE | INTRAVENOUS | Status: AC
  Filled 2020-09-09: qty 10

## 2020-09-09 MED ORDER — MIDAZOLAM HCL 2 MG/2ML IJ SOLN
INTRAMUSCULAR | Status: DC | PRN
  Administered 2020-09-09: 2 mg via INTRAVENOUS

## 2020-09-09 MED ORDER — ACETAMINOPHEN 10 MG/ML IV SOLN
INTRAVENOUS | Status: DC | PRN
  Administered 2020-09-09: 1000 mg via INTRAVENOUS

## 2020-09-09 MED ORDER — FENTANYL CITRATE (PF) 100 MCG/2ML IJ SOLN
INTRAMUSCULAR | Status: AC
  Filled 2020-09-09: qty 2

## 2020-09-09 MED ORDER — DEXAMETHASONE SODIUM PHOSPHATE 10 MG/ML IJ SOLN
INTRAMUSCULAR | Status: DC | PRN
  Administered 2020-09-09: 10 mg via INTRAVENOUS

## 2020-09-09 MED ORDER — DEXMEDETOMIDINE (PRECEDEX) IN NS 20 MCG/5ML (4 MCG/ML) IV SYRINGE
PREFILLED_SYRINGE | INTRAVENOUS | Status: AC
  Filled 2020-09-09: qty 5

## 2020-09-09 MED ORDER — LIDOCAINE HCL (CARDIAC) PF 100 MG/5ML IV SOSY
PREFILLED_SYRINGE | INTRAVENOUS | Status: DC | PRN
  Administered 2020-09-09: 100 mg via INTRAVENOUS

## 2020-09-09 MED ORDER — FENTANYL CITRATE (PF) 100 MCG/2ML IJ SOLN
INTRAMUSCULAR | Status: DC | PRN
  Administered 2020-09-09 (×3): 50 ug via INTRAVENOUS

## 2020-09-09 MED ORDER — LIDOCAINE-EPINEPHRINE (PF) 1 %-1:200000 IJ SOLN
INTRAMUSCULAR | Status: AC
  Filled 2020-09-09: qty 30

## 2020-09-09 MED ORDER — ACETAMINOPHEN 325 MG PO TABS: 650.0000 mg | ORAL_TABLET | Freq: Three times a day (TID) | ORAL | 0 refills | Status: AC | PRN

## 2020-09-09 MED ORDER — DOCUSATE SODIUM 100 MG PO CAPS: 100.0000 mg | ORAL_CAPSULE | Freq: Two times a day (BID) | ORAL | 0 refills | Status: AC | PRN

## 2020-09-09 MED ORDER — FENTANYL CITRATE (PF) 100 MCG/2ML IJ SOLN
INTRAMUSCULAR | Status: AC
  Administered 2020-09-09: 25 ug via INTRAVENOUS
  Filled 2020-09-09: qty 2

## 2020-09-09 MED ORDER — LIDOCAINE-EPINEPHRINE 1 %-1:100000 IJ SOLN
INTRAMUSCULAR | Status: DC | PRN
  Administered 2020-09-09: 13 mL

## 2020-09-09 MED ORDER — ONDANSETRON HCL 4 MG/2ML IJ SOLN
INTRAMUSCULAR | Status: DC | PRN
  Administered 2020-09-09: 4 mg via INTRAVENOUS

## 2020-09-09 MED ORDER — KETOROLAC TROMETHAMINE 30 MG/ML IJ SOLN
INTRAMUSCULAR | Status: DC | PRN
  Administered 2020-09-09: 30 mg via INTRAVENOUS

## 2020-09-09 MED ORDER — ONDANSETRON HCL 4 MG/2ML IJ SOLN: 4.0000 mg | Freq: Once | INTRAMUSCULAR | Status: DC | PRN

## 2020-09-09 MED ORDER — DEXMEDETOMIDINE (PRECEDEX) IN NS 20 MCG/5ML (4 MCG/ML) IV SYRINGE
PREFILLED_SYRINGE | INTRAVENOUS | Status: DC | PRN
  Administered 2020-09-09: 4 ug via INTRAVENOUS
  Administered 2020-09-09: 12 ug via INTRAVENOUS
  Administered 2020-09-09: 4 ug via INTRAVENOUS

## 2020-09-09 MED ORDER — SUGAMMADEX SODIUM 500 MG/5ML IV SOLN
INTRAVENOUS | Status: AC
  Filled 2020-09-09: qty 5

## 2020-09-09 MED ORDER — ROCURONIUM BROMIDE 100 MG/10ML IV SOLN
INTRAVENOUS | Status: DC | PRN
  Administered 2020-09-09: 20 mg via INTRAVENOUS
  Administered 2020-09-09: 50 mg via INTRAVENOUS

## 2020-09-09 MED ORDER — PROPOFOL 10 MG/ML IV BOLUS
INTRAVENOUS | Status: AC
  Filled 2020-09-09: qty 20

## 2020-09-09 MED ORDER — PROPOFOL 10 MG/ML IV BOLUS
INTRAVENOUS | Status: DC | PRN
  Administered 2020-09-09: 150 mg via INTRAVENOUS

## 2020-09-09 MED ORDER — SUGAMMADEX SODIUM 200 MG/2ML IV SOLN
INTRAVENOUS | Status: DC | PRN
  Administered 2020-09-09: 200 mg via INTRAVENOUS

## 2020-09-09 MED ORDER — IBUPROFEN 800 MG PO TABS: 800.0000 mg | ORAL_TABLET | Freq: Three times a day (TID) | ORAL | 0 refills | Status: DC | PRN

## 2020-09-09 MED ORDER — MIDAZOLAM HCL 2 MG/2ML IJ SOLN
INTRAMUSCULAR | Status: AC
  Filled 2020-09-09: qty 2

## 2020-09-09 MED ORDER — GLYCOPYRROLATE 0.2 MG/ML IJ SOLN
INTRAMUSCULAR | Status: DC | PRN
  Administered 2020-09-09: .2 mg via INTRAVENOUS

## 2020-09-09 MED ORDER — ENOXAPARIN SODIUM 40 MG/0.4ML ~~LOC~~ SOLN
40.0000 mg | SUBCUTANEOUS | Status: DC
  Filled 2020-09-09: qty 0.4

## 2020-09-09 MED ORDER — ACETAMINOPHEN 10 MG/ML IV SOLN
INTRAVENOUS | Status: AC
  Filled 2020-09-09: qty 100

## 2020-09-09 MED ORDER — FENTANYL CITRATE (PF) 100 MCG/2ML IJ SOLN
25.0000 ug | INTRAMUSCULAR | Status: DC | PRN
  Administered 2020-09-09 (×3): 25 ug via INTRAVENOUS

## 2020-09-09 MED ORDER — ROCURONIUM BROMIDE 10 MG/ML (PF) SYRINGE
PREFILLED_SYRINGE | INTRAVENOUS | Status: AC
  Filled 2020-09-09: qty 10

## 2020-09-09 SURGICAL SUPPLY — 55 items
ANCHOR TIS RET SYS 235ML (MISCELLANEOUS) ×2 IMPLANT
BAG INFUSER PRESSURE 100CC (MISCELLANEOUS) IMPLANT
BLADE SURG SZ11 CARB STEEL (BLADE) ×2 IMPLANT
CANNULA REDUC XI 12-8 STAPL (CANNULA) ×1
CANNULA REDUCER 12-8 DVNC XI (CANNULA) ×1 IMPLANT
CATH REDDICK CHOLANGI 4FR 50CM (CATHETERS) IMPLANT
CHLORAPREP W/TINT 26 (MISCELLANEOUS) ×2 IMPLANT
CLIP VESOLOCK MED LG 6/CT (CLIP) ×2 IMPLANT
COVER TIP SHEARS 8 DVNC (MISCELLANEOUS) ×1 IMPLANT
COVER TIP SHEARS 8MM DA VINCI (MISCELLANEOUS) ×1
COVER WAND RF STERILE (DRAPES) ×2 IMPLANT
DECANTER SPIKE VIAL GLASS SM (MISCELLANEOUS) ×2 IMPLANT
DEFOGGER SCOPE WARMER CLEARIFY (MISCELLANEOUS) ×2 IMPLANT
DERMABOND ADVANCED (GAUZE/BANDAGES/DRESSINGS) ×1
DERMABOND ADVANCED .7 DNX12 (GAUZE/BANDAGES/DRESSINGS) ×1 IMPLANT
DRAPE ARM DVNC X/XI (DISPOSABLE) ×4 IMPLANT
DRAPE C-ARM XRAY 36X54 (DRAPES) IMPLANT
DRAPE COLUMN DVNC XI (DISPOSABLE) ×1 IMPLANT
DRAPE DA VINCI XI ARM (DISPOSABLE) ×4
DRAPE DA VINCI XI COLUMN (DISPOSABLE) ×1
ELECT CAUTERY BLADE 6.4 (BLADE) ×2 IMPLANT
ELECT REM PT RETURN 9FT ADLT (ELECTROSURGICAL) ×2
ELECTRODE REM PT RTRN 9FT ADLT (ELECTROSURGICAL) ×1 IMPLANT
GLOVE BIOGEL PI IND STRL 7.0 (GLOVE) ×2 IMPLANT
GLOVE BIOGEL PI INDICATOR 7.0 (GLOVE) ×2
GLOVE SURG SYN 6.5 ES PF (GLOVE) ×4 IMPLANT
GOWN STRL REUS W/ TWL LRG LVL3 (GOWN DISPOSABLE) ×3 IMPLANT
GOWN STRL REUS W/TWL LRG LVL3 (GOWN DISPOSABLE) ×3
GRASPER SUT TROCAR 14GX15 (MISCELLANEOUS) IMPLANT
IRRIGATOR SUCT 8 DISP DVNC XI (IRRIGATION / IRRIGATOR) ×1 IMPLANT
IRRIGATOR SUCTION 8MM XI DISP (IRRIGATION / IRRIGATOR) ×1
IV NS 1000ML (IV SOLUTION)
IV NS 1000ML BAXH (IV SOLUTION) IMPLANT
LABEL OR SOLS (LABEL) ×2 IMPLANT
MANIFOLD NEPTUNE II (INSTRUMENTS) ×2 IMPLANT
NEEDLE HYPO 22GX1.5 SAFETY (NEEDLE) ×2 IMPLANT
NEEDLE INSUFFLATION 14GA 120MM (NEEDLE) ×2 IMPLANT
NS IRRIG 500ML POUR BTL (IV SOLUTION) ×2 IMPLANT
OBTURATOR OPTICAL STANDARD 8MM (TROCAR) ×1
OBTURATOR OPTICAL STND 8 DVNC (TROCAR) ×1
OBTURATOR OPTICALSTD 8 DVNC (TROCAR) ×1 IMPLANT
PACK LAP CHOLECYSTECTOMY (MISCELLANEOUS) ×2 IMPLANT
PENCIL ELECTRO HAND CTR (MISCELLANEOUS) ×2 IMPLANT
SEAL CANN UNIV 5-8 DVNC XI (MISCELLANEOUS) ×3 IMPLANT
SEAL XI 5MM-8MM UNIVERSAL (MISCELLANEOUS) ×3
SET TUBE SMOKE EVAC HIGH FLOW (TUBING) ×2 IMPLANT
SOLUTION ELECTROLUBE (MISCELLANEOUS) ×2 IMPLANT
STAPLER CANNULA SEAL DVNC XI (STAPLE) ×1 IMPLANT
STAPLER CANNULA SEAL XI (STAPLE) ×1
SUT MNCRL 4-0 (SUTURE) ×2
SUT MNCRL 4-0 27XMFL (SUTURE) ×2
SUT VICRYL 0 AB UR-6 (SUTURE) ×2 IMPLANT
SUTURE MNCRL 4-0 27XMF (SUTURE) ×2 IMPLANT
SYR 30ML LL (SYRINGE) IMPLANT
SYSTEM WECK SHIELD CLOSURE (TROCAR) ×2 IMPLANT

## 2020-09-09 NOTE — Anesthesia Postprocedure Evaluation (Signed)
Anesthesia Post Note  Patient: Hospital doctor A Solly  Procedure(s) Performed: XI ROBOTIC ASSISTED LAPAROSCOPIC CHOLECYSTECTOMY (N/A )  Patient location during evaluation: PACU Anesthesia Type: General Level of consciousness: awake and alert Pain management: pain level controlled Vital Signs Assessment: post-procedure vital signs reviewed and stable Respiratory status: spontaneous breathing, nonlabored ventilation and respiratory function stable Cardiovascular status: blood pressure returned to baseline and stable Postop Assessment: no apparent nausea or vomiting Anesthetic complications: no   No complications documented.   Last Vitals:  Vitals:   09/09/20 2040 09/09/20 2150  BP: 139/79 139/88  Pulse: 72 78  Resp: 16 17  Temp: 37.4 C 36.9 C  SpO2: 98% 99%    Last Pain:  Vitals:   09/09/20 2150  TempSrc: Oral  PainSc:                  Karleen Hampshire

## 2020-09-09 NOTE — Anesthesia Preprocedure Evaluation (Signed)
Anesthesia Evaluation  Patient identified by MRN, date of birth, ID band Patient awake    Reviewed: Allergy & Precautions, NPO status , Patient's Chart, lab work & pertinent test results  Airway Mallampati: III  TM Distance: <3 FB     Dental  (+) Chipped   Pulmonary Current Smoker,    Pulmonary exam normal        Cardiovascular negative cardio ROS Normal cardiovascular exam     Neuro/Psych  Headaches, PSYCHIATRIC DISORDERS Depression    GI/Hepatic Neg liver ROS,   Endo/Other  negative endocrine ROS  Renal/GU negative Renal ROS  negative genitourinary   Musculoskeletal negative musculoskeletal ROS (+)   Abdominal Normal abdominal exam  (+)   Peds negative pediatric ROS (+)  Hematology negative hematology ROS (+)   Anesthesia Other Findings Past Medical History: No date: Family history of breast cancer     Comment:  10/20 genetic testing letter sent No date: Migraine No date: Scoliosis  Reproductive/Obstetrics                             Anesthesia Physical Anesthesia Plan  ASA: II  Anesthesia Plan: General   Post-op Pain Management:    Induction: Intravenous  PONV Risk Score and Plan:   Airway Management Planned: Oral ETT  Additional Equipment:   Intra-op Plan:   Post-operative Plan: Extubation in OR  Informed Consent: I have reviewed the patients History and Physical, chart, labs and discussed the procedure including the risks, benefits and alternatives for the proposed anesthesia with the patient or authorized representative who has indicated his/her understanding and acceptance.     Dental advisory given  Plan Discussed with: CRNA and Surgeon  Anesthesia Plan Comments:         Anesthesia Quick Evaluation

## 2020-09-09 NOTE — Transfer of Care (Signed)
Immediate Anesthesia Transfer of Care Note  Patient: Hannah Gregory  Procedure(s) Performed: XI ROBOTIC ASSISTED LAPAROSCOPIC CHOLECYSTECTOMY (N/A )  Patient Location: PACU  Anesthesia Type:General  Level of Consciousness: awake and patient cooperative  Airway & Oxygen Therapy: Patient Spontanous Breathing and Patient connected to face mask oxygen  Post-op Assessment: Report given to RN and Post -op Vital signs reviewed and stable  Post vital signs: Reviewed and stable  Last Vitals:  Vitals Value Taken Time  BP 141/81 09/09/20 1848  Temp    Pulse 67 09/09/20 1849  Resp 22 09/09/20 1849  SpO2 99 % 09/09/20 1849  Vitals shown include unvalidated device data.  Last Pain:  Vitals:   09/09/20 1512  TempSrc: Oral  PainSc: 4       Patients Stated Pain Goal: 0 (09/09/20 1049)  Complications: No complications documented.

## 2020-09-09 NOTE — Progress Notes (Signed)
Patient was given Vicodin 5/325 mg 1 tablet orally at 2356 for right  abdominal pain. Her pain was an 5/10 and then a 3 /10 later on. She complained of abdominal pain 10/10 at 5:30 am but did not have order for sips of water being that she was NPO. Dr. Tonna Boehringer put order for sips with meds and Vicodin 5/325 mg 2 tablets were given to patient at 0626. Patient complained of worsening headache as well 10/10. NP Ouma was notified about all above and stated that she did not believe the headache was coming from Vicodin or Zosyn and that it was most likely because of illness and hx of migranes. Patient reported to me that she takes monthly injections of Ajoby Ubrevly PRN for migraines.

## 2020-09-09 NOTE — Anesthesia Procedure Notes (Signed)
Procedure Name: Intubation Date/Time: 09/09/2020 5:27 PM Performed by: Joanette Gula, Kyannah Climer, CRNA Pre-anesthesia Checklist: Patient identified, Emergency Drugs available, Suction available and Patient being monitored Patient Re-evaluated:Patient Re-evaluated prior to induction Oxygen Delivery Method: Circle system utilized Preoxygenation: Pre-oxygenation with 100% oxygen Induction Type: IV induction Ventilation: Mask ventilation without difficulty Laryngoscope Size: McGraph and 3 Grade View: Grade I Tube type: Oral Tube size: 7.0 mm Number of attempts: 1 Airway Equipment and Method: Stylet Placement Confirmation: ETT inserted through vocal cords under direct vision,  positive ETCO2 and breath sounds checked- equal and bilateral Secured at: 21 cm Tube secured with: Tape Dental Injury: Teeth and Oropharynx as per pre-operative assessment

## 2020-09-09 NOTE — Discharge Instructions (Signed)
Laparoscopic Cholecystectomy, Care After This sheet gives you information about how to care for yourself after your procedure. Your doctor may also give you more specific instructions. If you have problems or questions, contact your doctor. Follow these instructions at home: Care for cuts from surgery (incisions)   Follow instructions from your doctor about how to take care of your cuts from surgery. Make sure you: ? Wash your hands with soap and water before you change your bandage (dressing). If you cannot use soap and water, use hand sanitizer. ? Change your bandage as told by your doctor. ? Leave stitches (sutures), skin glue, or skin tape (adhesive) strips in place. They may need to stay in place for 2 weeks or longer. If tape strips get loose and curl up, you may trim the loose edges. Do not remove tape strips completely unless your doctor says it is okay.  Do not take baths, swim, or use a hot tub until your doctor says it is okay. OK TO SHOWER 24HRS AFTER YOUR SURGERY.   Check your surgical cut area every day for signs of infection. Check for: ? More redness, swelling, or pain. ? More fluid or blood. ? Warmth. ? Pus or a bad smell. Activity  Do not drive or use heavy machinery while taking prescription pain medicine.  Do not play contact sports until your doctor says it is okay.  Do not drive for 24 hours if you were given a medicine to help you relax (sedative).  Rest as needed. Do not return to work or school until your doctor says it is okay. General instructions .  tylenol and advil as needed for discomfort.  Please alternate between the two every four hours as needed for pain.   .  Use narcotics, if prescribed, only when tylenol and motrin is not enough to control pain. .  325-650mg every 8hrs to max of 3000mg/24hrs (including the 325mg in every norco dose) for the tylenol.   .  Advil up to 800mg per dose every 8hrs as needed for pain.    To prevent or treat constipation  while you are taking prescription pain medicine, your doctor may recommend that you: ? Drink enough fluid to keep your pee (urine) clear or pale yellow. ? Take over-the-counter or prescription medicines. ? Eat foods that are high in fiber, such as fresh fruits and vegetables, whole grains, and beans. ? Limit foods that are high in fat and processed sugars, such as fried and sweet foods. Contact a doctor if:  You develop a rash.  You have more redness, swelling, or pain around your surgical cuts.  You have more fluid or blood coming from your surgical cuts.  Your surgical cuts feel warm to the touch.  You have pus or a bad smell coming from your surgical cuts.  You have a fever.  One or more of your surgical cuts breaks open. Get help right away if:  You have trouble breathing.  You have chest pain.  You have pain that is getting worse in your shoulders.  You faint or feel dizzy when you stand.  You have very bad pain in your belly (abdomen).  You are sick to your stomach (nauseous) for more than one day.  You have throwing up (vomiting) that lasts for more than one day.  You have leg pain. This information is not intended to replace advice given to you by your health care provider. Make sure you discuss any questions you have with your   health care provider. Document Released: 06/26/2008 Document Revised: 04/07/2016 Document Reviewed: 03/05/2016 Elsevier Interactive Patient Education  2019 [employer]   

## 2020-09-09 NOTE — Op Note (Signed)
Preoperative diagnosis:  acute and cholecystitis  Postoperative diagnosis: same as above  Procedure: Robotic assisted Laparoscopic Cholecystectomy.   Anesthesia: GETA   Surgeon: Kathyjo Briere  Specimen: Gallbladder  Complications: None  EBL: 15mL  Wound Classification: Clean Contaminated  Indications: see HPI  Findings: Critical view of safety noted Cystic duct and artery identified, ligated and divided, clips remained intact at end of procedure Adequate hemostasis  Description of procedure:  The patient was placed on the operating table in the supine position. SCDs placed, pre-op abx administered.  General anesthesia was induced and OG tube placed by anesthesia. A time-out was completed verifying correct patient, procedure, site, positioning, and implant(s) and/or special equipment prior to beginning this procedure. The abdomen was prepped and draped in the usual sterile fashion.    Veress needle was placed at the Palmer's point and insufflation was started after confirming a positive saline drop test and no immediate increase in abdominal pressure.  After reaching 15 mm, the Veress needle was removed and a 8 mm port was placed via optiview technique under umbilicus measured 20mm from gallbladder.  The abdomen was inspected and no abnormalities or injuries were found.  Under direct vision, ports were placed in the following locations: One 12 mm patient left of the umbilicus, 8cm from the optiviewed port, one 8 mm port placed to the patient right of the umbilical port 8 cm apart.  1 additional 8 mm port placed lateral to the 12mm port.  Once ports were placed, The table was placed in the reverse Trendelenburg position with the right side up. The Xi platform was brought into the operative field and docked to the ports successfully.  An endoscope was placed through the umbilical port, fenestrated grasper through the adjacent patient right port, prograsp to the far patient left port, and then  a hook cautery in the left port.  The dome of the gallbladder was grasped with prograsp, passed and retracted over the dome of the liver. Adhesions between the gallbladder and omentum, duodenum and transverse colon were lysed via hook cautery. The infundibulum was grasped with the fenestrated grasper and retracted toward the right lower quadrant. This maneuver exposed Calot's triangle. The peritoneum overlying the gallbladder infundibulum was then dissected using combination of [place] dissector and electrocautery hook and the cystic duct and cystic artery identified.  Critical view of safety with the liver bed clearly visible behind the duct and artery with no additional structures noted.  The cystic duct and cystic artery clipped and divided close to the gallbladder.     The gallbladder was then dissected from its peritoneal and liver bed attachments by electrocautery. Hemostasis was checked prior to removing the hook cautery and the Endo Catch bag was then placed through the 12 mm port and the gallbladder was removed.  The gallbladder was passed off the table as a specimen. There was no evidence of bleeding from the gallbladder fossa or cystic artery or leakage of the bile from the cystic duct stump. The 12 mm port site closed with PMI using 0 vicryl under direct vision.  Abdomen desufflated and secondary trocars were removed under direct vision. No bleeding was noted. All skin incisions then closed with subcuticular sutures of 4-0 monocryl and dressed with topical skin adhesive. The orogastric tube was removed and patient extubated.  The patient tolerated the procedure well and was taken to the postanesthesia care unit in stable condition.  All sponge and instrument count correct at end of procedure.  

## 2020-09-09 NOTE — Progress Notes (Signed)
Subjective:  CC: Hannah Gregory is a 39 y.o. female  Hospital stay day 1,   Acute cholecystitis  HPI: Continue abdominal pain.  Feeling better now after pain medication  ROS:  General: Denies weight loss, weight gain, fatigue, fevers, chills, and night sweats. Heart: Denies chest pain, palpitations, racing heart, irregular heartbeat, leg pain or swelling, and decreased activity tolerance. Respiratory: Denies breathing difficulty, shortness of breath, wheezing, cough, and sputum. GI: Denies change in appetite, heartburn, nausea, vomiting, constipation, diarrhea, and blood in stool. GU: Denies difficulty urinating, pain with urinating, urgency, frequency, blood in urine.   Objective:   Temp:  [98 F (36.7 C)-98.3 F (36.8 C)] 98.1 F (36.7 C) (12/10 0805) Pulse Rate:  [54-87] 68 (12/10 0805) Resp:  [16-20] 18 (12/10 0805) BP: (140-165)/(88-97) 152/89 (12/10 0805) SpO2:  [95 %-100 %] 95 % (12/10 0805)     Height: 5\' 5"  (165.1 cm) Weight: 83.9 kg BMI (Calculated): 30.79   Intake/Output this shift:   Intake/Output Summary (Last 24 hours) at 09/09/2020 0835 Last data filed at 09/09/2020 14/07/2020 Gross per 24 hour  Intake --  Output 0 ml  Net 0 ml    Constitutional :  alert, cooperative, appears stated age and no distress  Respiratory:  clear to auscultation bilaterally  Cardiovascular:  regular rate and rhythm  Gastrointestinal: Soft, no guarding, focal tenderness to palpation epigastric region.   Skin: Cool and moist.   Psychiatric: Normal affect, non-agitated, not confused       LABS:  CMP Latest Ref Rng & Units 09/09/2020 09/08/2020 05/03/2020  Glucose 70 - 99 mg/dL 07/03/2020) 789(F) 76  BUN 6 - 20 mg/dL 12 20 13   Creatinine 0.44 - 1.00 mg/dL 810(F 7.51  Sodium 135 - 145 mmol/L 136 135 138  Potassium 3.5 - 5.1 mmol/L 3.8 3.5 4.0  Chloride 98 - 111 mmol/L 103 106 106  CO2 22 - 32 mmol/L 23 19(L) 26  Calcium 8.9 - 10.3 mg/dL 8.9 0.25 8.9  Total Protein 6.5 - 8.1 g/dL 7.5 7.7  6.9  Total Bilirubin 0.3 - 1.2 mg/dL 2.0(H) 0.9 1.1  Alkaline Phos 38 - 126 U/L 141(H) 97 -  AST 15 - 41 U/L 96(H) 24 14  ALT 0 - 44 U/L 100(H) 21 9   CBC Latest Ref Rng & Units 09/09/2020 09/08/2020 05/03/2020  WBC 4.0 - 10.5 K/uL 11.3(H) 16.5(H) 8.6  Hemoglobin 12.0 - 15.0 g/dL 14/06/2020 07/03/2020 24.2  Hematocrit 36.0 - 46.0 % 41.6 39.3 41.6  Platelets 150 - 400 K/uL 316 295 292    RADS: n/a Assessment:   Acute cholecystitis.  Was pending HIDA this a.m. but with the elevated LFTs and along with an increased alkaline phosphatase and white count, clinically this is acute cholecystitis.  Recommended proceeding with lap chole.  iscussed the risk of surgery including post-op infxn, seroma, biloma, chronic pain, poor-delayed wound healing, retained gallstone, conversion to open procedure, post-op SBO or ileus, and need for additional procedures to address said risks.  The risks of general anesthetic including MI, CVA, sudden death or even reaction to anesthetic medications also discussed. Alternatives include continued observation.  Benefits include possible symptom relief, prevention of complications including acute cholecystitis, pancreatitis.  Typical post operative recovery of 3-5 days rest, continued pain in area and incision sites, possible loose stools up to 4-6 weeks, also discussed.  The patient understands the risks, any and all questions were answered to the patient's satisfaction.

## 2020-09-10 LAB — CBC
HCT: 39.2 % (ref 36.0–46.0)
Hemoglobin: 13.4 g/dL (ref 12.0–15.0)
MCH: 32.8 pg (ref 26.0–34.0)
MCHC: 34.2 g/dL (ref 30.0–36.0)
MCV: 95.8 fL (ref 80.0–100.0)
Platelets: 300 10*3/uL (ref 150–400)
RBC: 4.09 MIL/uL (ref 3.87–5.11)
RDW: 12.1 % (ref 11.5–15.5)
WBC: 15 10*3/uL — ABNORMAL HIGH (ref 4.0–10.5)
nRBC: 0 % (ref 0.0–0.2)

## 2020-09-10 LAB — PHOSPHORUS: Phosphorus: 1.2 mg/dL — ABNORMAL LOW (ref 2.5–4.6)

## 2020-09-10 LAB — HEPATIC FUNCTION PANEL
ALT: 92 U/L — ABNORMAL HIGH (ref 0–44)
AST: 76 U/L — ABNORMAL HIGH (ref 15–41)
Albumin: 3.5 g/dL (ref 3.5–5.0)
Alkaline Phosphatase: 143 U/L — ABNORMAL HIGH (ref 38–126)
Bilirubin, Direct: 0.2 mg/dL (ref 0.0–0.2)
Indirect Bilirubin: 0.8 mg/dL (ref 0.3–0.9)
Total Bilirubin: 1 mg/dL (ref 0.3–1.2)
Total Protein: 7 g/dL (ref 6.5–8.1)

## 2020-09-10 LAB — BASIC METABOLIC PANEL
Anion gap: 9 (ref 5–15)
BUN: 15 mg/dL (ref 6–20)
CO2: 21 mmol/L — ABNORMAL LOW (ref 22–32)
Calcium: 8.5 mg/dL — ABNORMAL LOW (ref 8.9–10.3)
Chloride: 105 mmol/L (ref 98–111)
Creatinine, Ser: 1.01 mg/dL — ABNORMAL HIGH (ref 0.44–1.00)
GFR, Estimated: >60 mL/min (ref >60)
Glucose, Bld: 137 mg/dL — ABNORMAL HIGH (ref 70–99)
Potassium: 3.9 mmol/L (ref 3.5–5.1)
Sodium: 135 mmol/L (ref 135–145)

## 2020-09-10 LAB — MAGNESIUM: Magnesium: 2 mg/dL (ref 1.7–2.4)

## 2020-09-10 NOTE — Discharge Summary (Signed)
Patient ID: GHALIA REICKS MRN: [id number] DOB/AGE: [birth year] 39 y.o.  Admit date: 09/08/2020 Discharge date: 09/10/2020   Discharge Diagnoses:  Active Problems:   Abdominal pain   Procedures: Robotic assisted laparoscopic cholecystectomy.  Hospital Course: Patient with acute cholecystitis.  She underwent robotic assisted laparoscopic cholecystectomy.  She tolerated the procedure well.  This morning pain controlled with current pain medications.  Patient tolerating diet.  Patient passing gas.  Patient voiding spontaneously.  Wounds are dry and clean.  Physical Exam Cardiovascular:     Rate and Rhythm: Normal rate and regular rhythm.  Pulmonary:     Effort: Pulmonary effort is normal.     Breath sounds: Normal breath sounds.  Abdominal:     General: Abdomen is flat. A surgical scar is present. Bowel sounds are normal.     Palpations: Abdomen is soft.  Neurological:     Mental Status: She is alert and oriented to person, place, and time.   Wounds are dry and clean   Consults: None  Disposition: Discharge disposition: 01-Home or Self Care       Discharge Instructions    Diet - low sodium heart healthy   Complete by: As directed      Allergies as of 09/10/2020      Reactions   Imitrex [sumatriptan] Other (See Comments)   Side effect , chest pain, flushed    Chantix [varenicline Tartrate]    nausea   Prednisone Hives, Other (See Comments)   Numbness of her face      Medication List    TAKE these medications   acetaminophen 325 MG tablet Commonly known as: Tylenol Take 2 tablets (650 mg total) by mouth every 8 (eight) hours as needed for mild pain.   Ajovy 225 MG/1.5ML Soaj Generic drug: Fremanezumab-vfrm Inject into the skin.   amLODipine 2.5 MG tablet Commonly known as: NORVASC Take 1-2 tablets (2.5-5 mg total) by mouth daily. What changed:   how much to take  when to take this   ascorbic acid 500 MG tablet Commonly known as: VITAMIN C Take  500 mg by mouth daily.   atenolol-chlorthalidone 50-25 MG tablet Commonly known as: TENORETIC Take 0.5-1 tablets by mouth daily. What changed: how much to take   cholecalciferol 25 MCG (1000 UNIT) tablet Commonly known as: VITAMIN D3 Take 2,000 Units by mouth daily.   docusate sodium 100 MG capsule Commonly known as: Colace Take 1 capsule (100 mg total) by mouth 2 (two) times daily as needed for up to 10 days for mild constipation.   hydrALAZINE 25 MG tablet Commonly known as: APRESOLINE Take 1 tablet (25 mg total) by mouth 3 (three) times daily. Prn bp above 150/90 What changed:   when to take this  reasons to take this  additional instructions   HYDROcodone-acetaminophen 5-325 MG tablet Commonly known as: Norco Take 1 tablet by mouth every 6 (six) hours as needed for up to 6 doses for moderate pain.   ibuprofen 800 MG tablet Commonly known as: ADVIL Take 1 tablet (800 mg total) by mouth every 8 (eight) hours as needed for mild pain or moderate pain.   Ubrelvy 100 MG Tabs Generic drug: Ubrogepant Take 1 tablet by mouth daily as needed. May take 2 hours later if no improvement   valACYclovir 1000 MG tablet Commonly known as: VALTREX Take 2 tablets every 12 hours for 2 doses total, then stop taking.  Can take in the future if symptoms of cold sores just beginning to  help blunt the response What changed:   when to take this  reasons to take this   venlafaxine XR 75 MG 24 hr capsule Commonly known as: EFFEXOR-XR TAKE 1 CAPSULE BY MOUTH ONCE DAILY W/ BREAKFAST *TAKE WITH FOOD* *DO NOT CRUSH OR CHEW*   vitamin B-12 1000 MCG tablet Commonly known as: CYANOCOBALAMIN Take 1,000 mcg by mouth daily.   cyanocobalamin 1000 MCG/ML injection Commonly known as: (VITAMIN B-12) INJECT 1ML=1000MCG INTRAMUSCULARLY EVERY MONTH       Follow-up Information    Follow up In 2 weeks.   Why: post op lap chole

## 2020-09-13 LAB — SURGICAL PATHOLOGY

## 2020-09-27 ENCOUNTER — Other Ambulatory Visit: Payer: Self-pay | Admitting: Surgery

## 2020-09-27 DIAGNOSIS — K432 Incisional hernia without obstruction or gangrene: Secondary | ICD-10-CM

## 2020-10-05 ENCOUNTER — Ambulatory Visit: Admission: RE | Admit: 2020-10-05 | Payer: Managed Care, Other (non HMO) | Source: Ambulatory Visit

## 2020-10-11 NOTE — Progress Notes (Deleted)
Name: Hannah Gregory   MRN: [id number]    DOB: [birth year]   Date:10/11/2020       Progress Note  Subjective  Chief Complaint  Annual Exam  HPI  Patient presents for annual CPE.  Diet: *** Exercise: ***   Flowsheet Row Office Visit from 05/03/2020 in [facility]  AUDIT-C Score 1     Depression: Phq 9 is  {Desc; negative/positive:13464::"negative"} Depression screen [facility] 2/9 09/07/2020 08/10/2020 05/03/2020 04/06/2020 01/04/2020  Decreased Interest 0 0 0 0 0  Down, Depressed, Hopeless 0 0 0 0 0  PHQ - 2 Score 0 0 0 0 0  Altered sleeping 0 - 0 0 0  Tired, decreased energy 0 - 0 0 0  Change in appetite 0 - 0 0 0  Feeling bad or failure about yourself  0 - 0 0 0  Trouble concentrating 0 - 0 0 0  Moving slowly or fidgety/restless 0 - 0 0 0  Suicidal thoughts 0 - 0 0 0  PHQ-9 Score 0 - 0 0 0  Difficult doing work/chores - - - Not difficult at all Not difficult at all  Some recent data might be hidden   Hypertension: BP Readings from Last 3 Encounters:  09/10/20 122/76  08/10/20 (!) 156/108  05/03/20 (!) 170/100   Obesity: Wt Readings from Last 3 Encounters:  09/08/20 185 lb (83.9 kg)  08/10/20 179 lb 4.8 oz (81.3 kg)  05/03/20 171 lb 14.4 oz (78 kg)   BMI Readings from Last 3 Encounters:  09/08/20 30.79 kg/m  08/10/20 29.84 kg/m  05/03/20 28.61 kg/m     Vaccines:  HPV: up to at age 60 , ask insurance if age between 90-45  Shingrix: 29-64 yo and ask insurance if covered when patient above 89 yo Pneumonia:  educated and discussed with patient. Flu:  educated and discussed with patient.  Hep C Screening: 06/12/19 STD testing and prevention (HIV/chl/gon/syphilis): 09/09/20 Intimate partner violence: negative Sexual History : Menstrual History/LMP/Abnormal Bleeding:  Incontinence Symptoms:   Breast cancer:  - Last Mammogram: N/A - BRCA gene screening: N/A  Osteoporosis: Discussed high calcium and vitamin D supplementation, weight bearing  exercises  Cervical cancer screening: 12/11/18  Skin cancer: Discussed monitoring for atypical lesions  Colorectal cancer:    Lung cancer:   Low Dose CT Chest recommended if Age 3-80 years, 20 pack-year currently smoking OR have quit w/in 15years. Patient does not qualify.   ECG: 09/08/20  Advanced Care Planning: A voluntary discussion about advance care planning including the explanation and discussion of advance directives.  Discussed health care proxy and Living will, and the patient was able to identify a health care proxy as ***.  Patient does not have a living will at present time. If patient does have living will, I have requested they bring this to the clinic to be scanned in to their chart.  Lipids: Lab Results  Component Value Date   CHOL 164 06/12/2019   CHOL 183 03/26/2018   Lab Results  Component Value Date   HDL 44 (L) 06/12/2019   HDL 39 (L) 03/26/2018   Lab Results  Component Value Date   LDLCALC 103 (H) 06/12/2019   LDLCALC 127 (H) 03/26/2018   Lab Results  Component Value Date   TRIG 78 06/12/2019   TRIG 73 03/26/2018   Lab Results  Component Value Date   CHOLHDL 3.7 06/12/2019   CHOLHDL 4.7 03/26/2018   No results found for: LDLDIRECT  Glucose: Glucose  Date Value Ref Range Status  07/24/2014 94 65 - 99 mg/dL Final   Glucose, Bld  Date Value Ref Range Status  09/10/2020 137 (H) 70 - 99 mg/dL Final    Comment:    Glucose reference range applies only to samples taken after fasting for at least 8 hours.  09/09/2020 120 (H) 70 - 99 mg/dL Final    Comment:    Glucose reference range applies only to samples taken after fasting for at least 8 hours.  09/08/2020 136 (H) 70 - 99 mg/dL Final    Comment:    Glucose reference range applies only to samples taken after fasting for at least 8 hours.    Patient Active Problem List   Diagnosis Date Noted  . Abdominal pain 09/08/2020  . 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 B12 deficiency 12/11/2019  .  Vitamin D deficiency 12/11/2019  . Family history of pernicious anemia 12/11/2019  . Migraine without aura and without status migrainosus, not intractable 10/14/2019  . Menometrorrhagia 06/17/2019  . Migraine with aura and without status migrainosus 11/29/2016  . Chronic daily headache 11/29/2016  . Neck pain 11/29/2016  . Scoliosis of thoracolumbar spine 11/29/2016  . Mild depression ([place]) 11/29/2016  . History of anxiety 11/29/2016    No past surgical history on file.  Family History  Problem Relation Age of Onset  . Asthma Mother   . Hypertension Mother   . Hyperlipidemia Mother   . Thyroid disease Mother   . Heart attack Father   . Migraines Father   . Hypertension Father   . Hyperlipidemia Father   . Mental retardation Brother   . COPD Brother   . Hypertension Brother   . Emphysema Brother   . Breast cancer Maternal Grandmother   . Breast cancer Maternal Aunt   . Breast cancer Maternal Aunt     Social History   Socioeconomic History  . Marital status: Single    Spouse name: Not on file  . Number of children: 3  . Years of education: Not on file  . Highest education level: High school graduate  Occupational History  . Occupation: Pharmacy Tech     Comment: [employer]  Tobacco Use  . Smoking status: Current Every Day Smoker    Packs/day: 0.25    Years: 22.00    Pack years: 5.50    Types: Cigarettes    Start date: 10/01/1997  . Smokeless tobacco: Never Used  Vaping Use  . Vaping Use: Never used  Substance and Sexual Activity  . Alcohol use: Yes    Comment: occasionally  . Drug use: No  . Sexual activity: Yes    Partners: Male    Birth control/protection: Condom, Injection  Other Topics Concern  . Not on file  Social History Narrative   Single mother of 3 children, lives across from her parents   Social Determinants of Health   Financial Resource Strain: [place]   . Difficulty of Paying Living Expenses: Not hard at all  Food  Insecurity: No Food Insecurity  . Worried About Charity fundraiser in the Last Year: Never true  . Ran Out of Food in the Last Year: Never true  Transportation Needs: No Transportation Needs  . Lack of Transportation (Medical): No  . Lack of Transportation (Non-Medical): No  Physical Activity: Insufficiently Active  . Days of Exercise per Week: 3 days  . Minutes of Exercise per Session: 30 min  Stress: No Stress Concern Present  .  Feeling of Stress : Not at all  Social Connections: Socially Isolated  . Frequency of Communication with Friends and Family: More than three times a week  . Frequency of Social Gatherings with Friends and Family: More than three times a week  . Attends Religious Services: Never  . Active Member of Clubs or Organizations: No  . Attends Archivist Meetings: Never  . Marital Status: Never married  Intimate Partner Violence: Not At Risk  . Fear of Current or Ex-Partner: No  . Emotionally Abused: No  . Physically Abused: No  . Sexually Abused: No     Current Outpatient Medications:  .  amLODipine (NORVASC) 2.5 MG tablet, Take 1-2 tablets (2.5-5 mg total) by mouth daily. (Patient taking differently: Take 2.5 mg by mouth every evening.), Disp: 60 tablet, Rfl: 0 .  ascorbic acid (VITAMIN C) 500 MG tablet, Take 500 mg by mouth daily., Disp: , Rfl:  .  atenolol-chlorthalidone (TENORETIC) 50-25 MG tablet, Take 0.5-1 tablets by mouth daily. (Patient taking differently: Take 0.5 tablets by mouth daily.), Disp: 30 tablet, Rfl: 0 .  cholecalciferol (VITAMIN D3) 25 MCG (1000 UNIT) tablet, Take 2,000 Units by mouth daily., Disp: , Rfl:  .  cyanocobalamin (,VITAMIN B-12,) 1000 MCG/ML injection, INJECT 1ML=1000MCG INTRAMUSCULARLY EVERY MONTH, Disp: 1 mL, Rfl: 11 .  Fremanezumab-vfrm (AJOVY) 225 MG/1.5ML SOAJ, Inject into the skin., Disp: , Rfl:  .  hydrALAZINE (APRESOLINE) 25 MG tablet, Take 1 tablet (25 mg total) by mouth 3 (three) times daily. Prn bp above 150/90  (Patient taking differently: Take 25 mg by mouth 3 (three) times daily as needed (BP above 150/90).), Disp: 90 tablet, Rfl: 0 .  HYDROcodone-acetaminophen (NORCO) 5-325 MG tablet, Take 1 tablet by mouth every 6 (six) hours as needed for up to 6 doses for moderate pain., Disp: 6 tablet, Rfl: 0 .  ibuprofen (ADVIL) 800 MG tablet, Take 1 tablet (800 mg total) by mouth every 8 (eight) hours as needed for mild pain or moderate pain., Disp: 30 tablet, Rfl: 0 .  Ubrogepant (UBRELVY) 100 MG TABS, Take 1 tablet by mouth daily as needed. May take 2 hours later if no improvement, Disp: 10 tablet, Rfl: 0 .  valACYclovir (VALTREX) 1000 MG tablet, Take 2 tablets every 12 hours for 2 doses total, then stop taking.  Can take in the future if symptoms of cold sores just beginning to help blunt the response (Patient taking differently: as needed (cold sores). Take 2 tablets every 12 hours for 2 doses total, then stop taking.  Can take in the future if symptoms of cold sores just beginning to help blunt the response), Disp: 12 tablet, Rfl: 1 .  venlafaxine XR (EFFEXOR-XR) 75 MG 24 hr capsule, TAKE 1 CAPSULE BY MOUTH ONCE DAILY W/ BREAKFAST *TAKE WITH FOOD* *DO NOT CRUSH OR CHEW*, Disp: 90 capsule, Rfl: 11 .  vitamin B-12 (CYANOCOBALAMIN) 1000 MCG tablet, Take 1,000 mcg by mouth daily., Disp: , Rfl:   Allergies  Allergen Reactions  . Imitrex [Sumatriptan] Other (See Comments)    Side effect , chest pain, flushed   . Chantix [Varenicline Tartrate]     nausea  . Prednisone Hives and Other (See Comments)    Numbness of her face      ROS  ***  Objective  There were no vitals filed for this visit.  There is no height or weight on file to calculate BMI.  Physical Exam ***  Recent Results (from the past 2160 hour(s))  CBC  with Differential     Status: Abnormal   Collection Time: 09/08/20  3:38 AM  Result Value Ref Range   WBC 16.5 (H) 4.0 - 10.5 K/uL   RBC 4.28 3.87 - 5.11 MIL/uL   Hemoglobin 14.4 12.0 -  15.0 g/dL   HCT 39.3 36.0 - 46.0 %   MCV 91.8 80.0 - 100.0 fL   MCH 33.6 26.0 - 34.0 pg   MCHC 36.6 (H) 30.0 - 36.0 g/dL   RDW 12.1 11.5 - 15.5 %   Platelets 295 150 - 400 K/uL   nRBC 0.0 0.0 - 0.2 %   Neutrophils Relative % 75 %   Neutro Abs 12.4 (H) 1.7 - 7.7 K/uL   Lymphocytes Relative 17 %   Lymphs Abs 2.8 0.7 - 4.0 K/uL   Monocytes Relative 6 %   Monocytes Absolute 1.0 0.1 - 1.0 K/uL   Eosinophils Relative 1 %   Eosinophils Absolute 0.1 0.0 - 0.5 K/uL   Basophils Relative 1 %   Basophils Absolute 0.1 0.0 - 0.1 K/uL   Immature Granulocytes 0 %   Abs Immature Granulocytes 0.06 0.00 - 0.07 K/uL    Comment: Performed at [facility], [address] [place], [place] [address]  Comprehensive metabolic panel     Status: Abnormal   Collection Time: 09/08/20  3:38 AM  Result Value Ref Range   Sodium 135 135 - 145 mmol/L   Potassium 3.5 3.5 - 5.1 mmol/L   Chloride 106 98 - 111 mmol/L   CO2 19 (L) 22 - 32 mmol/L   Glucose, Bld 136 (H) 70 - 99 mg/dL    Comment: Glucose reference range applies only to samples taken after fasting for at least 8 hours.   BUN 20 6 - 20 mg/dL   Creatinine, Ser 0.91 0.44 - 1.00 mg/dL   Calcium 10.0 8.9 - 10.3 mg/dL   Total Protein 7.7 6.5 - 8.1 g/dL   Albumin 4.2 3.5 - 5.0 g/dL   AST 24 15 - 41 U/L   ALT 21 0 - 44 U/L   Alkaline Phosphatase 97 38 - 126 U/L   Total Bilirubin 0.9 0.3 - 1.2 mg/dL   GFR, Estimated >60 >60 mL/min    Comment: (NOTE) Calculated using the CKD-EPI Creatinine Equation (2021)    Anion gap 10 5 - 15    Comment: Performed at [facility], [address] [place], [place] [address]  Lipase, blood     Status: None   Collection Time: 09/08/20  3:38 AM  Result Value Ref Range   Lipase 25 11 - 51 U/L    Comment: Performed at [facility], [place], [place] [address]  Troponin I (High Sensitivity)     Status: None   Collection Time: 09/08/20  3:38 AM  Result Value Ref Range    Troponin I (High Sensitivity) 4 <18 ng/L    Comment: (NOTE) Elevated high sensitivity troponin I (hsTnI) values and significant  changes across serial measurements may suggest ACS but many other  chronic and acute conditions are known to elevate hsTnI results.  Refer to the "Links" section for chest pain algorithms and additional  guidance. Performed at [facility], [address] [place], [place] [address]   Urinalysis, Complete w Microscopic     Status: Abnormal   Collection Time: 09/08/20  3:38 AM  Result Value Ref Range   Color, Urine YELLOW (A) YELLOW   APPearance HAZY (A) CLEAR  Specific Gravity, Urine 1.018 1.005 - 1.030   pH 5.0 5.0 - 8.0   Glucose, UA NEGATIVE NEGATIVE mg/dL   Hgb urine dipstick SMALL (A) NEGATIVE   Bilirubin Urine NEGATIVE NEGATIVE   Ketones, ur NEGATIVE NEGATIVE mg/dL   Protein, ur NEGATIVE NEGATIVE mg/dL   Nitrite NEGATIVE NEGATIVE   Leukocytes,Ua NEGATIVE NEGATIVE   RBC / HPF 0-5 0 - 5 RBC/hpf   WBC, UA 6-10 0 - 5 WBC/hpf   Bacteria, UA RARE (A) NONE SEEN   Squamous Epithelial / LPF 0-5 0 - 5   Mucus PRESENT     Comment: Performed at [facility], [address] [place], [place] [address]  POC urine preg, ED (not at [facility])     Status: None   Collection Time: 09/08/20  3:46 AM  Result Value Ref Range   Preg Test, Ur NEGATIVE NEGATIVE    Comment:        THE SENSITIVITY OF THIS METHODOLOGY IS >24 mIU/mL   Troponin I (High Sensitivity)     Status: None   Collection Time: 09/08/20  5:44 AM  Result Value Ref Range   Troponin I (High Sensitivity) 3 <18 ng/L    Comment: (NOTE) Elevated high sensitivity troponin I (hsTnI) values and significant  changes across serial measurements may suggest ACS but many other  chronic and acute conditions are known to elevate hsTnI results.  Refer to the "Links" section for chest pain algorithms and additional  guidance. Performed at [facility], [address]  [place],  [address]   Resp Panel by RT-PCR (Flu A&B, Covid) Nasopharyngeal Swab     Status: None   Collection Time: 09/08/20 12:52 PM   Specimen: Nasopharyngeal Swab; Nasopharyngeal(NP) swabs in vial transport medium  Result Value Ref Range   SARS Coronavirus 2 by RT PCR NEGATIVE NEGATIVE    Comment: (NOTE) SARS-CoV-2 target nucleic acids are NOT DETECTED.  The SARS-CoV-2 RNA is generally detectable in upper respiratory specimens during the acute phase of infection. The lowest concentration of SARS-CoV-2 viral copies this assay can detect is 138 copies/mL. A negative result does not preclude SARS-Cov-2 infection and should not be used as the sole basis for treatment or other patient management decisions. A negative result may occur with  improper specimen collection/handling, submission of specimen other than nasopharyngeal swab, presence of viral mutation(s) within the areas targeted by this assay, and inadequate number of viral copies(<138 copies/mL). A negative result must be combined with clinical observations, patient history, and epidemiological information. The expected result is Negative.  Fact Sheet for Patients:  EntrepreneurPulse.com.au  Fact Sheet for Healthcare Providers:  IncredibleEmployment.be  This test is no t yet approved or cleared by the [place] FDA and  has been authorized for detection and/or diagnosis of SARS-CoV-2 by FDA under an Emergency Use Authorization (EUA). This EUA will remain  in effect (meaning this test can be used) for the duration of the COVID-19 declaration under Section 564(b)(1) of the Act, 21 U.S.C.section 360bbb-3(b)(1), unless the authorization is terminated  or revoked sooner.       Influenza A by PCR NEGATIVE NEGATIVE   Influenza B by PCR NEGATIVE NEGATIVE    Comment: (NOTE) The Xpert Xpress SARS-CoV-2/FLU/RSV plus assay is intended as an aid in the diagnosis of influenza from  Nasopharyngeal swab specimens and should not be used as a sole basis for treatment. Nasal washings and aspirates are unacceptable for Xpert Xpress SARS-CoV-2/FLU/RSV testing.  Fact Sheet for Patients: EntrepreneurPulse.com.au  Fact Sheet for Healthcare Providers: IncredibleEmployment.be  This test is not yet approved or cleared by the [place] FDA and has been authorized for detection and/or diagnosis of SARS-CoV-2 by FDA under an Emergency Use Authorization (EUA). This EUA will remain in effect (meaning this test can be used) for the duration of the COVID-19 declaration under Section 564(b)(1) of the Act, 21 U.S.C. section 360bbb-3(b)(1), unless the authorization is terminated or revoked.  Performed at [facility], [address] [place], [place] [address]   HIV Antibody (routine testing w rflx)     Status: None   Collection Time: 09/09/20  5:02 AM  Result Value Ref Range   HIV Screen 4th Generation wRfx Non Reactive Non Reactive    Comment: Performed at [facility], [place] [address], [place], [place] [address]  Basic metabolic panel     Status: Abnormal   Collection Time: 09/09/20  5:02 AM  Result Value Ref Range   Sodium 136 135 - 145 mmol/L   Potassium 3.8 3.5 - 5.1 mmol/L   Chloride 103 98 - 111 mmol/L   CO2 23 22 - 32 mmol/L   Glucose, Bld 120 (H) 70 - 99 mg/dL    Comment: Glucose reference range applies only to samples taken after fasting for at least 8 hours.   BUN 12 6 - 20 mg/dL   Creatinine, Ser 0.97 0.44 - 1.00 mg/dL   Calcium 8.9 8.9 - 10.3 mg/dL   GFR, Estimated >60 >60 mL/min    Comment: (NOTE) Calculated using the CKD-EPI Creatinine Equation (2021)    Anion gap 10 5 - 15    Comment: Performed at [facility], [address] [place], [place] [address]  Magnesium     Status: None   Collection Time: 09/09/20  5:02 AM  Result Value Ref Range   Magnesium 2.0 1.7 - 2.4 mg/dL    Comment:  Performed at [facility], [address] [place], [place] [address]  Phosphorus     Status: None   Collection Time: 09/09/20  5:02 AM  Result Value Ref Range   Phosphorus 2.7 2.5 - 4.6 mg/dL    Comment: Performed at [facility], [address] [place], [place] [address]  CBC     Status: Abnormal   Collection Time: 09/09/20  5:02 AM  Result Value Ref Range   WBC 11.3 (H) 4.0 - 10.5 K/uL   RBC 4.41 3.87 - 5.11 MIL/uL   Hemoglobin 14.7 12.0 - 15.0 g/dL   HCT 41.6 36.0 - 46.0 %   MCV 94.3 80.0 - 100.0 fL   MCH 33.3 26.0 - 34.0 pg   MCHC 35.3 30.0 - 36.0 g/dL   RDW 12.5 11.5 - 15.5 %   Platelets 316 150 - 400 K/uL   nRBC 0.0 0.0 - 0.2 %    Comment: Performed at [facility], [address] [place], [place] [address]  Hepatic function panel     Status: Abnormal   Collection Time: 09/09/20  5:02 AM  Result Value Ref Range   Total Protein 7.5 6.5 - 8.1 g/dL   Albumin 3.9 3.5 - 5.0 g/dL   AST 96 (H) 15 - 41 U/L   ALT 100 (H) 0 - 44 U/L   Alkaline Phosphatase 141 (H) 38 - 126 U/L   Total Bilirubin 2.0 (H) 0.3 - 1.2 mg/dL   Bilirubin, Direct 0.3 (H) 0.0 - 0.2 mg/dL   Indirect Bilirubin 1.7 (H) 0.3 -  0.9 mg/dL    Comment: Performed at [facility], [address] [place], [place] [address]  Surgical pathology     Status: None   Collection Time: 09/09/20  5:49 PM  Result Value Ref Range   SURGICAL PATHOLOGY      SURGICAL PATHOLOGY CASE: [id number] PATIENT: Lanique Tomasini Surgical Pathology Report     Specimen Submitted: A. Gallbladder  Clinical History: Acute cholecystitis     DIAGNOSIS: A. GALLBLADDER; CHOLECYSTECTOMY: - CHOLELITHIASIS WITH ACUTE CHOLECYSTITIS. - NEGATIVE FOR DYSPLASIA AND MALIGNANCY.  GROSS DESCRIPTION: A. Labeled: Gallbladder Received: Formalin Size of specimen: 9 x 4.2 x 4.1 cm Specimen integrity: Intact External surface: The serosa is purple-brown, smooth, and glistening with a roughened hepatic  bed. Wall thickness: Ranges from 0.1-0.4 cm with the thicker areas containing edema Mucosa: The mucosa is tan-gray and diffusely flattened with scattered areas of tan-yellow exudate. Cystic duct: The cystic duct is 2.2 x 1.1 x 0.9 cm.  The duct is obstructed by a single stone.  A distinct adjacent lymph node candidate is not grossly appreciated. Bile present: There is green translucent bile. Stones present: There are multiple yellow, firm, mult ifaceted stones, 5.5 x 4 x 0.7 cm in aggregate. Other findings: None grossly appreciated.  Block summary: 1 - cystic duct resection margin, en face and inked blue, with representative edematous wall and flattened mucosa with exudate   Final Diagnosis performed by Bryan Lemma, MD.   Electronically signed 09/13/2020 4:13:45PM The electronic signature indicates that the named Attending Pathologist has evaluated the specimen Technical component performed at [facility], [address] [place], [place] [address] Lab: [phone] Dir: Rush Farmer, MD, MMM  Professional component performed at [facility], [facility], [address], [place], [place] [address] Lab: [phone] Dir: Dellia Nims. Rubinas, MD   CBC     Status: Abnormal   Collection Time: 09/09/20  8:25 PM  Result Value Ref Range   WBC 19.1 (H) 4.0 - 10.5 K/uL   RBC 4.36 3.87 - 5.11 MIL/uL   Hemoglobin 14.4 12.0 - 15.0 g/dL   HCT 42.3 36.0 - 46.0 %   MCV 97.0 80.0 - 100.0 fL   MCH 33.0 26.0 - 34.0 pg   MCHC 34.0 30.0 - 36.0 g/dL   RDW 12.5 11.5 - 15.5 %   Platelets 281 150 - 400 K/uL   nRBC 0.0 0.0 - 0.2 %    Comment: Performed at [facility], [address] [place],  [address]  Creatinine, serum     Status: Abnormal   Collection Time: 09/09/20  8:25 PM  Result Value Ref Range   Creatinine, Ser 1.19 (H) 0.44 - 1.00 mg/dL   GFR, Estimated >60 >60 mL/min    Comment: (NOTE) Calculated using the CKD-EPI Creatinine Equation (2021) Performed at  [facility], [address] [place],  [address]   Basic metabolic panel     Status: Abnormal   Collection Time: 09/10/20  4:10 AM  Result Value Ref Range   Sodium 135 135 - 145 mmol/L   Potassium 3.9 3.5 - 5.1 mmol/L   Chloride 105 98 - 111 mmol/L   CO2 21 (L) 22 - 32 mmol/L   Glucose, Bld 137 (H) 70 - 99 mg/dL    Comment: Glucose reference range applies only to samples taken after fasting for at least 8 hours.   BUN 15 6 - 20 mg/dL   Creatinine, Ser 1.01 (H) 0.44 - 1.00 mg/dL   Calcium 8.5 (L) 8.9 -  10.3 mg/dL   GFR, Estimated >60 >60 mL/min    Comment: (NOTE) Calculated using the CKD-EPI Creatinine Equation (2021)    Anion gap 9 5 - 15    Comment: Performed at [facility], [address] [place], [place] [address]  Magnesium     Status: None   Collection Time: 09/10/20  4:10 AM  Result Value Ref Range   Magnesium 2.0 1.7 - 2.4 mg/dL    Comment: Performed at [facility], [address] [place], [place] [address]  Phosphorus     Status: Abnormal   Collection Time: 09/10/20  4:10 AM  Result Value Ref Range   Phosphorus 1.2 (L) 2.5 - 4.6 mg/dL    Comment: Performed at [facility], [address] [place], [place] [address]  CBC     Status: Abnormal   Collection Time: 09/10/20  4:10 AM  Result Value Ref Range   WBC 15.0 (H) 4.0 - 10.5 K/uL   RBC 4.09 3.87 - 5.11 MIL/uL   Hemoglobin 13.4 12.0 - 15.0 g/dL   HCT 39.2 36.0 - 46.0 %   MCV 95.8 80.0 - 100.0 fL   MCH 32.8 26.0 - 34.0 pg   MCHC 34.2 30.0 - 36.0 g/dL   RDW 12.1 11.5 - 15.5 %   Platelets 300 150 - 400 K/uL   nRBC 0.0 0.0 - 0.2 %    Comment: Performed at [facility], [address] [place], [place] [address]  Hepatic function panel     Status: Abnormal   Collection Time: 09/10/20  4:10 AM  Result Value Ref Range   Total Protein 7.0 6.5 - 8.1 g/dL   Albumin 3.5 3.5 - 5.0 g/dL   AST 76 (H) 15 - 41 U/L   ALT 92 (H) 0 - 44 U/L   Alkaline Phosphatase  143 (H) 38 - 126 U/L   Total Bilirubin 1.0 0.3 - 1.2 mg/dL   Bilirubin, Direct 0.2 0.0 - 0.2 mg/dL   Indirect Bilirubin 0.8 0.3 - 0.9 mg/dL    Comment: Performed at [facility], [address] [place], [place] [address]    Diabetic Foot Exam: Diabetic Foot Exam - Simple   No data filed    ***  Fall Risk: Fall Risk  09/07/2020 08/10/2020 05/03/2020 04/06/2020 01/04/2020  Falls in the past year? 0 0 0 0 0  Number falls in past yr: 0 0 0 0 0  Injury with Fall? 0 0 0 0 0   ***  Functional Status Survey:   ***  Assessment & Plan  There are no diagnoses linked to this encounter.  -USPSTF grade A and B recommendations reviewed with patient; age-appropriate recommendations, preventive care, screening tests, etc discussed and encouraged; healthy living encouraged; see AVS for patient education given to patient -Discussed importance of 150 minutes of physical activity weekly, eat two servings of fish weekly, eat one serving of tree nuts ( cashews, pistachios, pecans, almonds.Marland Kitchen) every other day, eat 6 servings of fruit/vegetables daily and drink plenty of water and avoid sweet beverages.   -Reviewed Health Maintenance: ***

## 2020-10-12 ENCOUNTER — Ambulatory Visit
Admission: RE | Admit: 2020-10-12 | Discharge: 2020-10-12 | Disposition: A | Discharge status: Home / Self Care | Payer: Managed Care, Other (non HMO) | Source: Ambulatory Visit | Attending: Surgery | Admitting: Surgery

## 2020-10-12 ENCOUNTER — Ambulatory Visit: Payer: Managed Care, Other (non HMO)

## 2020-10-12 ENCOUNTER — Other Ambulatory Visit: Payer: Self-pay

## 2020-10-12 DIAGNOSIS — K432 Incisional hernia without obstruction or gangrene: Secondary | ICD-10-CM | POA: Insufficient documentation

## 2020-10-12 IMAGING — CT CT ABD-PELV W/ CM
2 of 4 series · 15 of 46 positions shown, 17 images · IV contrast (omnipaque)
Comparison: CT the abdomen and pelvis [DATE].

CLINICAL DATA: 39-year-old female status post laparoscopic
cholecystectomy on [DATE], presenting with persistent left-sided
mid abdominal pain and diarrhea since surgery.

EXAM:
CT ABDOMEN AND PELVIS WITH CONTRAST
TECHNIQUE: Multidetector CT imaging of the abdomen and pelvis was performed
using the standard protocol following bolus administration of
intravenous contrast.
CONTRAST:  100mL OMNIPAQUE IOHEXOL 300 MG/ML  SOLN

[Series 2: abd pelvis 5.00 · axial · 0.68mm/px · z∈[-1474,-1054]mm · 12 of 96 slices shown, 14 images]
[im 8/96  soft-tissue]
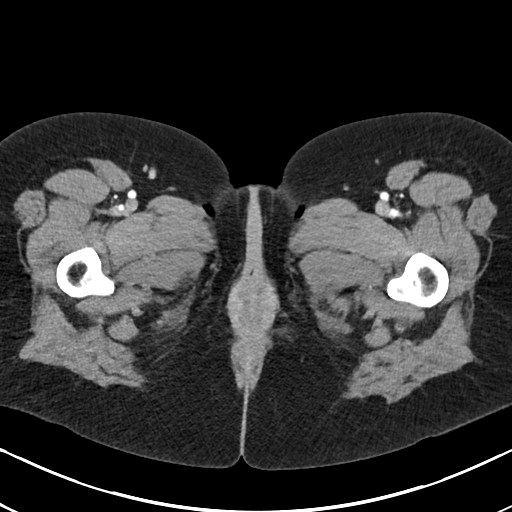
[im 8/96  bone]
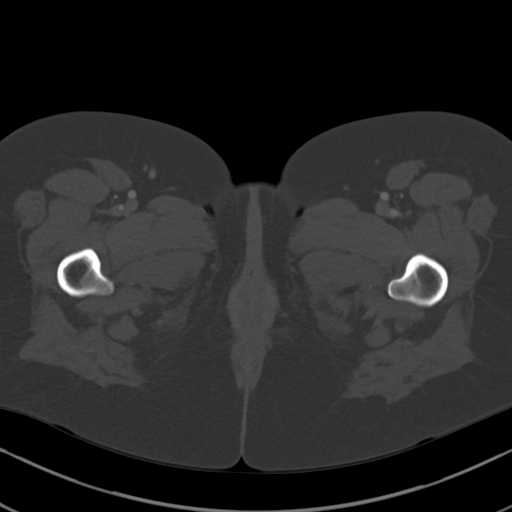
[im 16/96  soft-tissue]
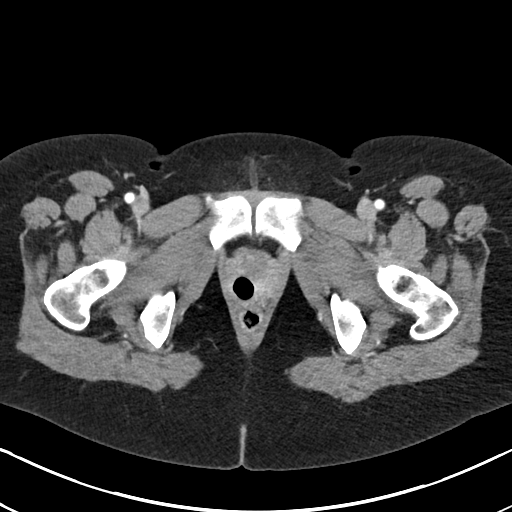
[im 23/96  soft-tissue]
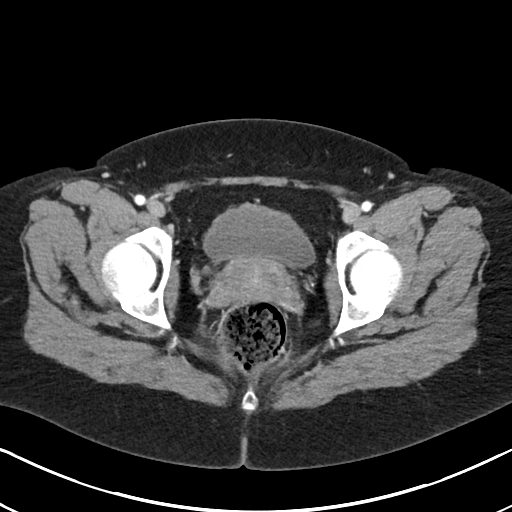
[im 31/96  soft-tissue]
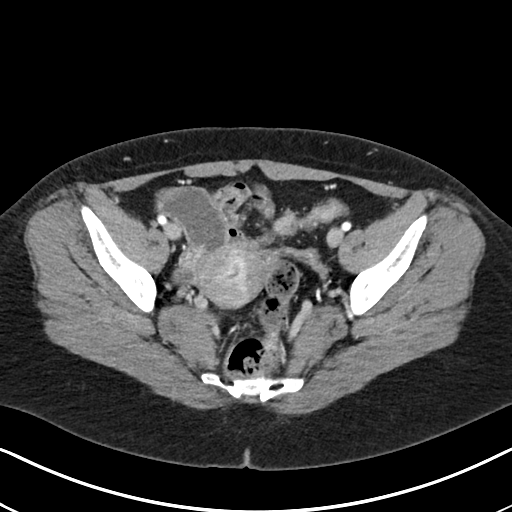
[im 39/96  soft-tissue]
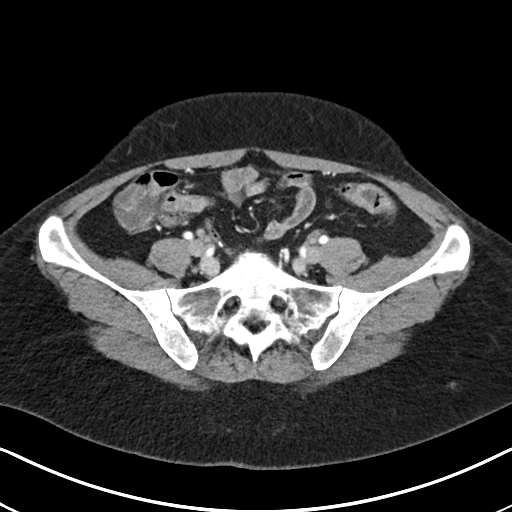
[im 46/96  soft-tissue]
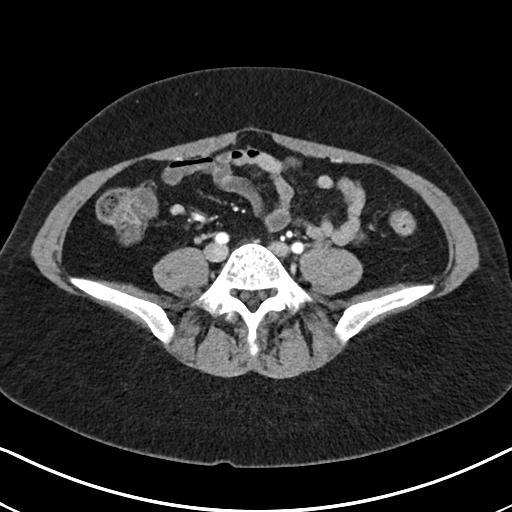
[im 54/96  soft-tissue]
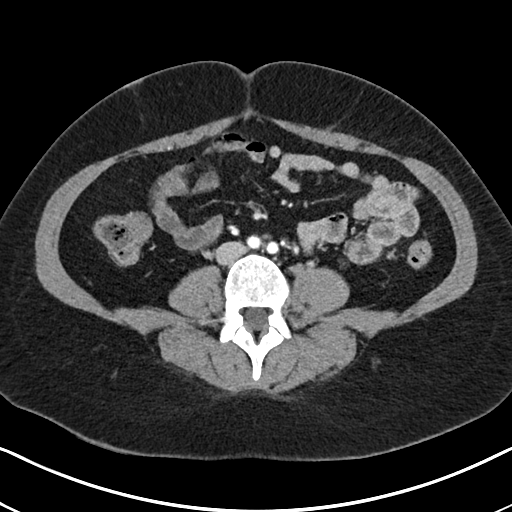
[im 61/96  soft-tissue]
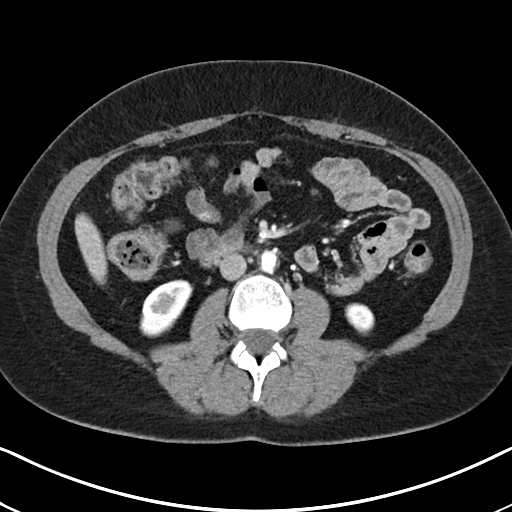
[im 69/96  soft-tissue]
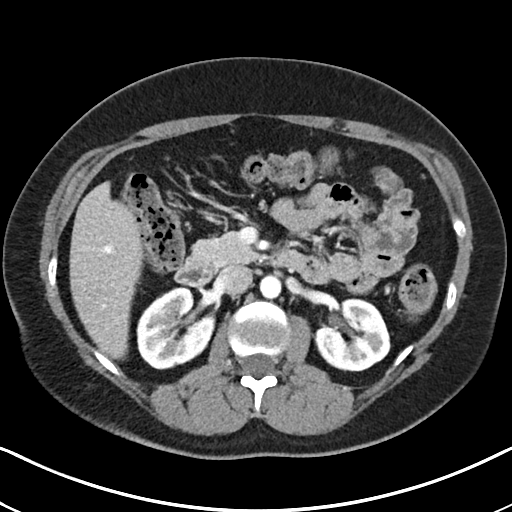
[im 69/96  bone]
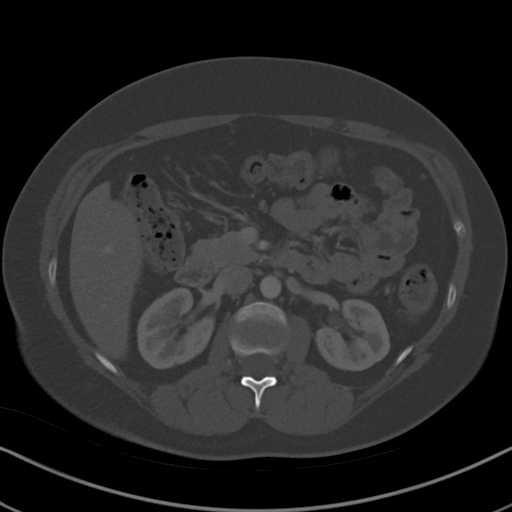
[im 77/96  soft-tissue]
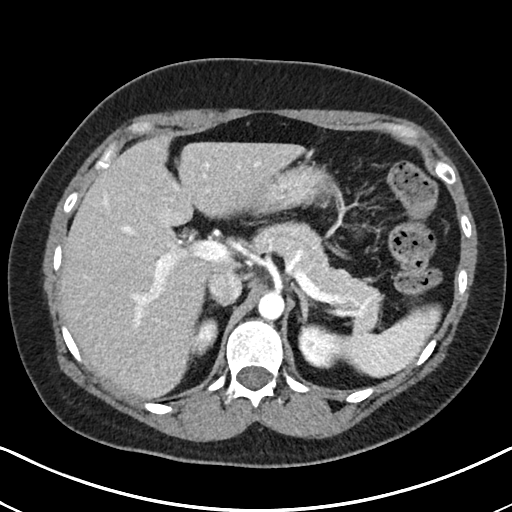
[im 84/96  soft-tissue]
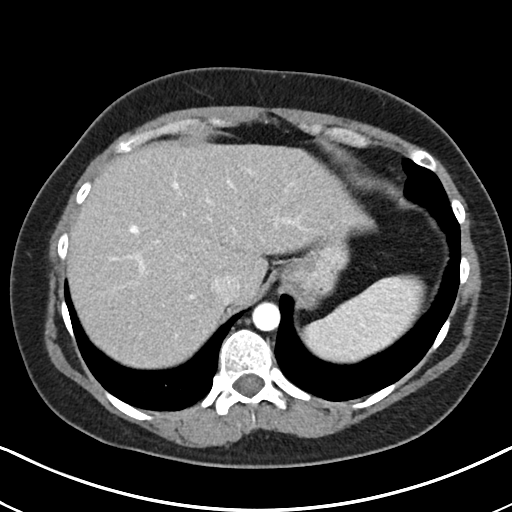
[im 92/96  soft-tissue]
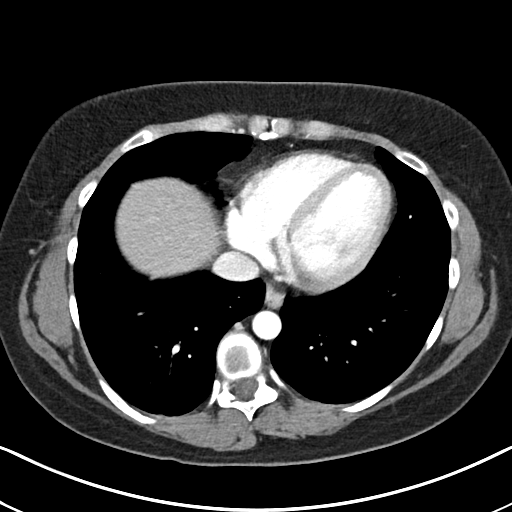

[Series 4: coronals abd pelvis 2.00 cor · coronal · 0.68mm/px · 3 of 144 slices shown]
[im 48/144  soft-tissue]
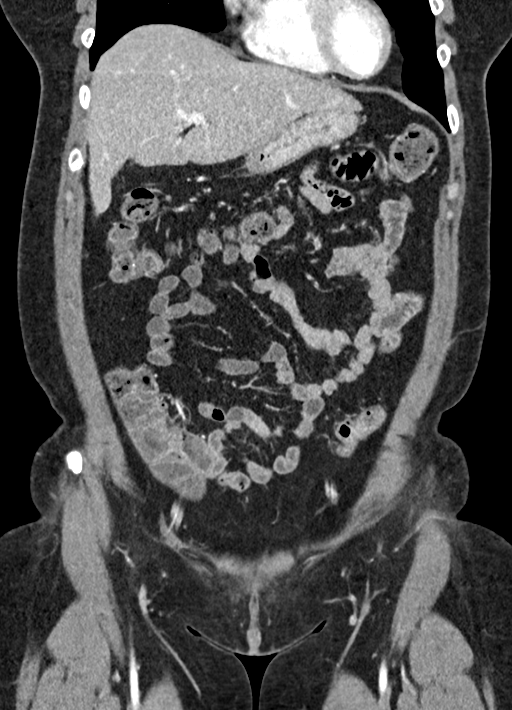
[im 64/144  soft-tissue]
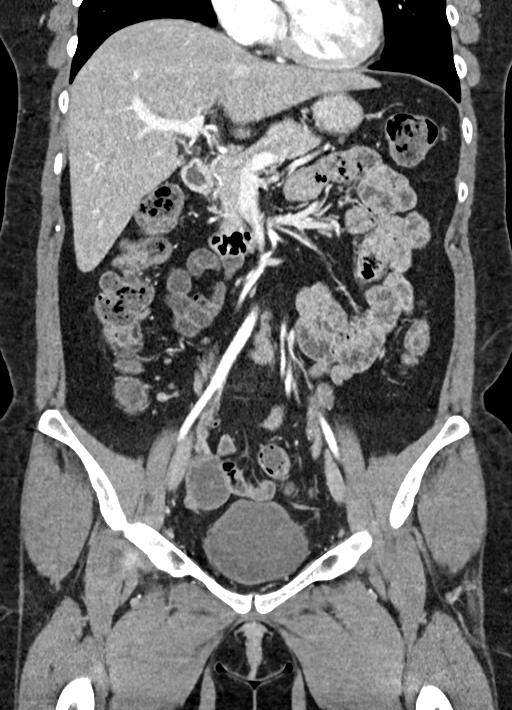
[im 80/144  soft-tissue]
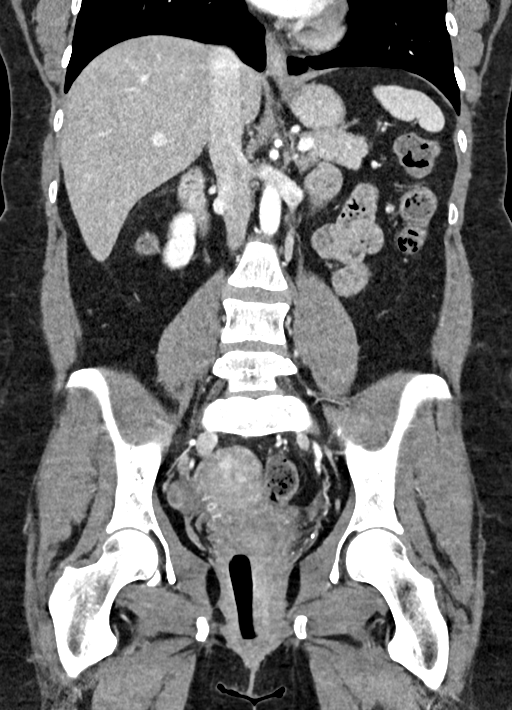

[15 of 46 positions shown; findings below may reference images not displayed]

FINDINGS: Lower chest: Unremarkable.

Hepatobiliary: No suspicious cystic or solid hepatic lesions. No
intra or extrahepatic biliary ductal dilatation. Status post
cholecystectomy. No unexpected postoperative fluid collections in
the gallbladder fossa.

Pancreas: No pancreatic mass. No pancreatic ductal dilatation. No
pancreatic or peripancreatic fluid collections or inflammatory
changes.

Spleen: Unremarkable.

Adrenals/Urinary Tract: 5 mm nonobstructive calculus in the lower
pole collecting system of the left kidney. Bilateral kidneys and
adrenal glands are otherwise normal in appearance. No
hydroureteronephrosis. Urinary bladder is normal in appearance.

Stomach/Bowel: The appearance of the stomach is normal. No
pathologic dilatation of small bowel or colon. The appendix is not
confidently identified and may be surgically absent. Regardless,
there are no inflammatory changes noted adjacent to the cecum to
suggest the presence of an acute appendicitis at this time.

Vascular/Lymphatic: Aortic atherosclerosis, without evidence of
aneurysm or dissection in the abdominal or pelvic vasculature. No
lymphadenopathy noted in the abdomen or pelvis.

Reproductive: Tampon present in the vagina. Uterus and ovaries are
unremarkable in appearance.

Other: No significant volume of ascites.  No pneumoperitoneum.

Musculoskeletal: There are no aggressive appearing lytic or blastic
lesions noted in the visualized portions of the skeleton.
IMPRESSION: 1. No acute findings are noted in the abdomen or pelvis to account
for the patient's symptoms.
2. Status post cholecystectomy. No unexpected postoperative fluid
collection or other complicating features identified.
3. 5 mm nonobstructive calculus in the lower pole collecting system
of left kidney. No ureteral stones tract obstruction or findings. Of
urinary
4. Aortic atherosclerosis.

## 2020-10-12 MED ORDER — IOHEXOL 300 MG/ML  SOLN
100.0000 mL | Freq: Once | INTRAMUSCULAR | Status: AC | PRN
  Administered 2020-10-12: 100 mL via INTRAVENOUS

## 2020-10-13 ENCOUNTER — Encounter: Payer: Managed Care, Other (non HMO) | Admitting: Family Medicine

## 2020-10-14 ENCOUNTER — Ambulatory Visit: Payer: Managed Care, Other (non HMO)

## 2020-10-17 NOTE — Telephone Encounter (Signed)
erronous

## 2020-12-01 ENCOUNTER — Other Ambulatory Visit: Payer: Self-pay | Admitting: Family Medicine

## 2020-12-01 DIAGNOSIS — I10 Essential (primary) hypertension: Secondary | ICD-10-CM

## 2020-12-01 NOTE — Telephone Encounter (Signed)
appt scheduled with Dr Carlynn Purl for 3.31.2022

## 2020-12-27 NOTE — Progress Notes (Signed)
Name: Hannah Gregory   MRN: [id number]    DOB: Feb 16, 1981   Date:12/29/2020       Progress Note  Subjective  Chief Complaint  Medication Refill  HPI   HTN:  She started to develop high bp Summer of 2021, bp was going up and down, associated with some dizziness, scotomas and headaches. We checked labs, EKG and started her on bp medication. She is now on Norvasc and half dose of Atenolol chlorthalidone. She states bp still goes up at home, we will try increasing to full dose Atenolol chlorthalidone , can try half bid, make sure has a high potassium diet and call back if any cramping.   Major depression recurrent:  shewas seen September 2019  and wasnot been feeling sad, but states she was very  short tempered, felt anger all the time but wasworse at work.She still feels overwhelmed raising three kids by herselfand working full time.She denies suicidal thoughts or ideation.She tried Zoloft during pregnancy and also Citalopramin the pastbut states did not seem to help. She was started on Effexor 06/2018 and it worked for a while but she feels a higher dose may help, she cannot afford therapy, explained Effexor may raise BP therefore we will try to add Abilify for now and monitor bp    Migraine Headache: she has a long history of migraine, but states had a severe episode that started on 09/08/2019 in the middle of night, associated with photophobia and nausea. She states her typical symptoms are - aura with scotomas - followed by severe headache associated with nausea, sometimes vomiting and photophobia and phonophobia. She cannot tolerate Imitrex ( caused chest tightness ). We referred her to neurologist and had a negative MRI  10/26/2019, [facility] January 2021 and migraine frequency has improved significantly. She also takes Ubrelvy prn, episodes are now down to once or twice a month. Bernita RaisinUbrelvy puts her to sleep but controls symptoms.   Menometrorrhagia:  she was having break through bleeding. Seen by Dr. Tiburcio PeaHarris, she is off Depo, using condoms only to prevent pregnancy. Discussed considering getting IUD again. Discussed risk of pregnancy   Vitamin B12 deficiency: she is now getting B12 injections, advised to switch to SL B12 and because of family history of Pernicious anemias she is still on monthly injections , we will recheck level today. Last dose was about one month ago   Vitamin D deficiency: she is currently taking 2000  ui otc daily  Post-cholecystectomy diarrhea: she continues to have bowel urgency right after she eats and diarrhea, she states her surgeon expected it to resolve after 6 weeks, but surgery was done end of 2021. Discussed avoiding greasy food, eat smaller meals, consider Questran and to contact her surgeon again  Patient Active Problem List   Diagnosis Date Noted  . Abdominal pain 09/08/2020  . 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 B12 deficiency 12/11/2019  . Vitamin D deficiency 12/11/2019  . Family history of pernicious anemia 12/11/2019  . Migraine without aura and without status migrainosus, not intractable 10/14/2019  . Menometrorrhagia 06/17/2019  . Migraine with aura and without status migrainosus 11/29/2016  . Chronic daily headache 11/29/2016  . Neck pain 11/29/2016  . Scoliosis of thoracolumbar spine 11/29/2016  . Mild depression (HCC) 11/29/2016  . History of anxiety 11/29/2016    No past surgical history on file.  Family History  Problem Relation Age of Onset  . Asthma Mother   . Hypertension Mother   .  Hyperlipidemia Mother   . Thyroid disease Mother   . Heart attack Father   . Migraines Father   . Hypertension Father   . Hyperlipidemia Father   . Mental retardation Brother   . COPD Brother   . Hypertension Brother   . Emphysema Brother   . Breast cancer Maternal Grandmother   . Breast cancer Maternal Aunt   . Breast cancer Maternal Aunt     Social History   Tobacco Use  . Smoking  status: Current Every Day Smoker    Packs/day: 0.25    Years: 22.00    Pack years: 5.50    Types: Cigarettes    Start date: 10/01/1997  . Smokeless tobacco: Never Used  Substance Use Topics  . Alcohol use: Yes    Comment: occasionally     Current Outpatient Medications:  .  ARIPiprazole (ABILIFY) 2 MG tablet, Take 1 tablet (2 mg total) by mouth every evening., Disp: 90 tablet, Rfl: 0 .  ascorbic acid (VITAMIN C) 500 MG tablet, Take 500 mg by mouth daily., Disp: , Rfl:  .  cholecalciferol (VITAMIN D3) 25 MCG (1000 UNIT) tablet, Take 2,000 Units by mouth daily., Disp: , Rfl:  .  cyanocobalamin (,VITAMIN B-12,) 1000 MCG/ML injection, INJECT 1ML=1000MCG INTRAMUSCULARLY EVERY MONTH, Disp: 1 mL, Rfl: 11 .  Fremanezumab-vfrm (AJOVY) 225 MG/1.5ML SOAJ, Inject into the skin., Disp: , Rfl:  .  Ubrogepant (UBRELVY) 100 MG TABS, Take 1 tablet by mouth daily as needed. May take 2 hours later if no improvement, Disp: 10 tablet, Rfl: 0 .  valACYclovir (VALTREX) 1000 MG tablet, Take 2 tablets every 12 hours for 2 doses total, then stop taking.  Can take in the future if symptoms of cold sores just beginning to help blunt the response (Patient taking differently: as needed (cold sores). Take 2 tablets every 12 hours for 2 doses total, then stop taking.  Can take in the future if symptoms of cold sores just beginning to help blunt the response), Disp: 12 tablet, Rfl: 1 .  vitamin B-12 (CYANOCOBALAMIN) 1000 MCG tablet, Take 1,000 mcg by mouth daily., Disp: , Rfl:  .  amLODipine (NORVASC) 2.5 MG tablet, Take 1 tablet (2.5 mg total) by mouth every evening., Disp: 90 tablet, Rfl: 0 .  atenolol-chlorthalidone (TENORETIC) 50-25 MG tablet, Take 1 tablet by mouth daily., Disp: 90 tablet, Rfl: 0 .  hydrALAZINE (APRESOLINE) 25 MG tablet, Take 1 tablet (25 mg total) by mouth 3 (three) times daily as needed (BP above 150/90)., Disp: 90 tablet, Rfl: 0 .  venlafaxine XR (EFFEXOR-XR) 75 MG 24 hr capsule, Take 1 capsule (75 mg  total) by mouth daily with breakfast., Disp: 90 capsule, Rfl: 1  Allergies  Allergen Reactions  . Imitrex [Sumatriptan] Other (See Comments)    Side effect , chest pain, flushed   . Chantix [Varenicline Tartrate]     nausea  . Prednisone Hives and Other (See Comments)    Numbness of her face     I personally reviewed active problem list, medication list, allergies, family history, social history, health maintenance with the patient/caregiver today.   ROS  Constitutional: Negative for fever or weight change.  Respiratory: Negative for cough and shortness of breath.   Cardiovascular: Negative for chest pain or palpitations.  Gastrointestinal: Negative for abdominal pain, no bowel changes.  Musculoskeletal: Negative for gait problem or joint swelling.  Skin: Negative for rash.  Neurological: Negative for dizziness or headache.  No other specific complaints in a complete review  of systems (except as listed in HPI above).  Objective  Vitals:   12/29/20 1052  BP: 132/88  Pulse: 82  Resp: 16  Temp: 98 F (36.7 C)  TempSrc: Oral  SpO2: 99%  Weight: 181 lb (82.1 kg)  Height: 5\' 5"  (1.651 m)    Body mass index is 30.12 kg/m.  Physical Exam  Constitutional: Patient appears well-developed and well-nourished. Obese No distress.  HEENT: head atraumatic, normocephalic, pupils equal and reactive to light,  neck supple Cardiovascular: Normal rate, regular rhythm and normal heart sounds.  No murmur heard. No BLE edema. Pulmonary/Chest: Effort normal and breath sounds normal. No respiratory distress. Abdominal: Soft.  There is no tenderness. Psychiatric: Patient has a normal mood and affect. behavior is normal. Judgment and thought content normal.  PHQ2/9: Depression screen [facility] 2/9 12/29/2020 09/07/2020 08/10/2020 05/03/2020 04/06/2020  Decreased Interest 3 0 0 0 0  Down, Depressed, Hopeless 3 0 0 0 0  PHQ - 2 Score 6 0 0 0 0  Altered sleeping 0 0 - 0 0  Tired, decreased energy 3 0  - 0 0  Change in appetite 3 0 - 0 0  Feeling bad or failure about yourself  2 0 - 0 0  Trouble concentrating 0 0 - 0 0  Moving slowly or fidgety/restless 0 0 - 0 0  Suicidal thoughts 0 0 - 0 0  PHQ-9 Score 14 0 - 0 0  Difficult doing work/chores - - - - Not difficult at all  Some recent data might be hidden    phq 9 is positive    Fall Risk: Fall Risk  12/29/2020 09/07/2020 08/10/2020 05/03/2020 04/06/2020  Falls in the past year? 0 0 0 0 0  Number falls in past yr: 0 0 0 0 0  Injury with Fall? 0 0 0 0 0     Functional Status Survey: Is the patient deaf or have difficulty hearing?: No Does the patient have difficulty seeing, even when wearing glasses/contacts?: No Does the patient have difficulty concentrating, remembering, or making decisions?: No Does the patient have difficulty walking or climbing stairs?: No Does the patient have difficulty dressing or bathing?: No Does the patient have difficulty doing errands alone such as visiting a doctor's office or shopping?: No    Assessment & Plan  1. Labile essential hypertension  - atenolol-chlorthalidone (TENORETIC) 50-25 MG tablet; Take 1 tablet by mouth daily.  Dispense: 90 tablet; Refill: 0 - amLODipine (NORVASC) 2.5 MG tablet; Take 1 tablet (2.5 mg total) by mouth every evening.  Dispense: 90 tablet; Refill: 0 - hydrALAZINE (APRESOLINE) 25 MG tablet; Take 1 tablet (25 mg total) by mouth 3 (three) times daily as needed (BP above 150/90).  Dispense: 90 tablet; Refill: 0 - CBC with Differential/Platelet - COMPLETE METABOLIC PANEL WITH GFR  2. B12 deficiency  - B12 and Folate Panel  3. Major depression in remission (HCC)  - venlafaxine XR (EFFEXOR-XR) 75 MG 24 hr capsule; Take 1 capsule (75 mg total) by mouth daily with breakfast.  Dispense: 90 capsule; Refill: 1 - ARIPiprazole (ABILIFY) 2 MG tablet; Take 1 tablet (2 mg total) by mouth every evening.  Dispense: 90 tablet; Refill: 0  4. Migraine with aura and without status  migrainosus, not intractable   5. Vitamin D deficiency  - VITAMIN D 25 Hydroxy (Vit-D Deficiency, Fractures)  6. Dyslipidemia  - Lipid panel  7. Leukocytosis, unspecified type  Recheck levels   8. Diabetes mellitus screening  - Hemoglobin A1c  9. Post-cholecystectomy syndrome

## 2020-12-29 ENCOUNTER — Encounter: Payer: Self-pay | Admitting: Family Medicine

## 2020-12-29 ENCOUNTER — Ambulatory Visit: Payer: Managed Care, Other (non HMO) | Admitting: Family Medicine

## 2020-12-29 ENCOUNTER — Other Ambulatory Visit: Payer: Self-pay

## 2020-12-29 VITALS — BP 132/88 | HR 82 | Temp 98.0°F | Resp 16 | Ht 65.0 in | Wt 181.0 lb

## 2020-12-29 DIAGNOSIS — E538 Deficiency of other specified B group vitamins: Secondary | ICD-10-CM

## 2020-12-29 DIAGNOSIS — K915 Postcholecystectomy syndrome: Secondary | ICD-10-CM

## 2020-12-29 DIAGNOSIS — F325 Major depressive disorder, single episode, in full remission: Secondary | ICD-10-CM | POA: Diagnosis not present

## 2020-12-29 DIAGNOSIS — I10 Essential (primary) hypertension: Secondary | ICD-10-CM | POA: Diagnosis not present

## 2020-12-29 DIAGNOSIS — E785 Hyperlipidemia, unspecified: Secondary | ICD-10-CM

## 2020-12-29 DIAGNOSIS — D72829 Elevated white blood cell count, unspecified: Secondary | ICD-10-CM

## 2020-12-29 DIAGNOSIS — G43109 Migraine with aura, not intractable, without status migrainosus: Secondary | ICD-10-CM

## 2020-12-29 DIAGNOSIS — E559 Vitamin D deficiency, unspecified: Secondary | ICD-10-CM

## 2020-12-29 DIAGNOSIS — Z131 Encounter for screening for diabetes mellitus: Secondary | ICD-10-CM

## 2020-12-29 MED ORDER — AMLODIPINE BESYLATE 2.5 MG PO TABS: 2.5000 mg | ORAL_TABLET | Freq: Every evening | ORAL | 0 refills | Status: DC

## 2020-12-29 MED ORDER — ARIPIPRAZOLE 2 MG PO TABS: 2.0000 mg | ORAL_TABLET | Freq: Every evening | ORAL | 0 refills | Status: DC

## 2020-12-29 MED ORDER — ATENOLOL-CHLORTHALIDONE 50-25 MG PO TABS: 1.0000 | ORAL_TABLET | Freq: Every day | ORAL | 0 refills | Status: DC

## 2020-12-29 MED ORDER — HYDRALAZINE HCL 25 MG PO TABS: 25.0000 mg | ORAL_TABLET | Freq: Three times a day (TID) | ORAL | 0 refills | Status: AC | PRN

## 2020-12-29 MED ORDER — VENLAFAXINE HCL ER 75 MG PO CP24: 75.0000 mg | ORAL_CAPSULE | Freq: Every day | ORAL | 1 refills | Status: DC

## 2020-12-30 ENCOUNTER — Ambulatory Visit: Payer: Managed Care, Other (non HMO) | Admitting: Family Medicine

## 2020-12-30 LAB — COMPLETE METABOLIC PANEL WITH GFR
AG Ratio: 1.6 (calc) (ref 1.0–2.5)
ALT: 11 U/L (ref 6–29)
AST: 16 U/L (ref 10–30)
Albumin: 4.2 g/dL (ref 3.6–5.1)
Alkaline phosphatase (APISO): 95 U/L (ref 31–125)
BUN: 22 mg/dL (ref 7–25)
CO2: 26 mmol/L (ref 20–32)
Calcium: 9.4 mg/dL (ref 8.6–10.2)
Chloride: 105 mmol/L (ref 98–110)
Creat: 0.97 mg/dL (ref 0.50–1.10)
GFR, Est African American: 86 mL/min/{1.73_m2} (ref >=60)
GFR, Est Non African American: 75 mL/min/{1.73_m2} (ref >=60)
Globulin: 2.7 g/dL (calc) (ref 1.9–3.7)
Glucose, Bld: 89 mg/dL (ref 65–99)
Potassium: 4.1 mmol/L (ref 3.5–5.3)
Sodium: 137 mmol/L (ref 135–146)
Total Bilirubin: 0.8 mg/dL (ref 0.2–1.2)
Total Protein: 6.9 g/dL (ref 6.1–8.1)

## 2020-12-30 LAB — CBC WITH DIFFERENTIAL/PLATELET
Absolute Monocytes: 773 cells/uL (ref 200–950)
Basophils Absolute: 46 cells/uL (ref 0–200)
Basophils Relative: 0.5 %
Eosinophils Absolute: 101 cells/uL (ref 15–500)
Eosinophils Relative: 1.1 %
HCT: 40 % (ref 35.0–45.0)
Hemoglobin: 13.7 g/dL (ref 11.7–15.5)
Lymphs Abs: 2962 cells/uL (ref 850–3900)
MCH: 33.6 pg — ABNORMAL HIGH (ref 27.0–33.0)
MCHC: 34.3 g/dL (ref 32.0–36.0)
MCV: 98 fL (ref 80.0–100.0)
MPV: 10.2 fL (ref 7.5–12.5)
Monocytes Relative: 8.4 %
Neutro Abs: 5318 cells/uL (ref 1500–7800)
Neutrophils Relative %: 57.8 %
Platelets: 292 10*3/uL (ref 140–400)
RBC: 4.08 10*6/uL (ref 3.80–5.10)
RDW: 11.7 % (ref 11.0–15.0)
Total Lymphocyte: 32.2 %
WBC: 9.2 10*3/uL (ref 3.8–10.8)

## 2020-12-30 LAB — LIPID PANEL
Cholesterol: 185 mg/dL (ref <200)
HDL: 49 mg/dL — ABNORMAL LOW (ref >=50)
LDL Cholesterol (Calc): 93 mg/dL (calc)
Non-HDL Cholesterol (Calc): 136 mg/dL (calc) — ABNORMAL HIGH (ref <130)
Total CHOL/HDL Ratio: 3.8 (calc) (ref <5.0)
Triglycerides: 323 mg/dL — ABNORMAL HIGH (ref <150)

## 2020-12-30 LAB — HEMOGLOBIN A1C
Hgb A1c MFr Bld: 5.1 % of total Hgb (ref <5.7)
Mean Plasma Glucose: 100 mg/dL
eAG (mmol/L): 5.5 mmol/L

## 2020-12-30 LAB — B12 AND FOLATE PANEL
Folate: 8.2 ng/mL
Vitamin B-12: 472 pg/mL (ref 200–1100)

## 2020-12-30 LAB — VITAMIN D 25 HYDROXY (VIT D DEFICIENCY, FRACTURES): Vit D, 25-Hydroxy: 21 ng/mL — ABNORMAL LOW (ref 30–100)

## 2021-03-28 ENCOUNTER — Other Ambulatory Visit: Payer: Self-pay

## 2021-03-28 ENCOUNTER — Ambulatory Visit: Payer: Managed Care, Other (non HMO) | Admitting: Family Medicine

## 2021-03-28 ENCOUNTER — Encounter: Payer: Self-pay | Admitting: Family Medicine

## 2021-03-28 VITALS — HR 91 | Temp 98.1°F | Resp 16 | Ht 63.0 in | Wt 181.0 lb

## 2021-03-28 DIAGNOSIS — R519 Headache, unspecified: Secondary | ICD-10-CM | POA: Diagnosis not present

## 2021-03-28 DIAGNOSIS — J029 Acute pharyngitis, unspecified: Secondary | ICD-10-CM | POA: Diagnosis not present

## 2021-03-28 DIAGNOSIS — R509 Fever, unspecified: Secondary | ICD-10-CM

## 2021-03-28 MED ORDER — AMOXICILLIN 875 MG PO TABS: 875.0000 mg | ORAL_TABLET | Freq: Two times a day (BID) | ORAL | 0 refills | Status: AC

## 2021-03-28 NOTE — Patient Instructions (Signed)
It was great to see you!  Our plans for today:  - Take the antibiotic as prescribed. - See below for self-isolation guidelines. You may end your quarantine if your test is negative or if positive, once you are 5 days from symptom onset and fever free for 24 hours without use of tylenol or ibuprofen. - Certainly, if you are having difficulties breathing or unable to keep down fluids, go to the Emergency Department.   Take care and seek immediate care sooner if you develop any concerns.   Dr. Linwood Dibbles     Person Under Monitoring Name: Hannah Gregory  Location: [address] [place] [address]   Infection Prevention Recommendations for Individuals Confirmed to have, or Being Evaluated for, 2019 Novel Coronavirus (COVID-19) Infection Who Receive Care at Home  Individuals who are confirmed to have, or are being evaluated for, COVID-19 should follow the prevention steps below until a healthcare provider or local or state health department says they can return to normal activities.  Stay home except to get medical care You should restrict activities outside your home, except for getting medical care. Do not go to work, school, or public areas, and do not use public transportation or taxis.  Call ahead before visiting your doctor Before your medical appointment, call the healthcare provider and tell them that you have, or are being evaluated for, COVID-19 infection. This will help the healthcare provider's office take steps to keep other people from getting infected. Ask your healthcare provider to call the local or state health department.  Monitor your symptoms Seek prompt medical attention if your illness is worsening (e.g., difficulty breathing). Before going to your medical appointment, call the healthcare provider and tell them that you have, or are being evaluated for, COVID-19 infection. Ask your healthcare provider to call the local or state health  department.  Wear a facemask You should wear a facemask that covers your nose and mouth when you are in the same room with other people and when you visit a healthcare provider. People who live with or visit you should also wear a facemask while they are in the same room with you.  Separate yourself from other people in your home As much as possible, you should stay in a different room from other people in your home. Also, you should use a separate bathroom, if available.  Avoid sharing household items You should not share dishes, drinking glasses, cups, eating utensils, towels, bedding, or other items with other people in your home. After using these items, you should wash them thoroughly with soap and water.  Cover your coughs and sneezes Cover your mouth and nose with a tissue when you cough or sneeze, or you can cough or sneeze into your sleeve. Throw used tissues in a lined trash can, and immediately wash your hands with soap and water for at least 20 seconds or use an alcohol-based hand rub.  Wash your [employer] your hands often and thoroughly with soap and water for at least 20 seconds. You can use an alcohol-based hand sanitizer if soap and water are not available and if your hands are not visibly dirty. Avoid touching your eyes, nose, and mouth with unwashed hands.   Prevention Steps for Caregivers and Household Members of Individuals Confirmed to have, or Being Evaluated for, COVID-19 Infection Being Cared for in the Home  If you live with, or provide care at home for, a person confirmed to have, or being evaluated for, COVID-19  infection please follow these guidelines to prevent infection:  Follow healthcare provider's instructions Make sure that you understand and can help the patient follow any healthcare provider instructions for all care.  Provide for the patient's basic needs You should help the patient with basic needs in the home and provide support for getting  groceries, prescriptions, and other personal needs.  Monitor the patient's symptoms If they are getting sicker, call his or her medical provider and tell them that the patient has, or is being evaluated for, COVID-19 infection. This will help the healthcare provider's office take steps to keep other people from getting infected. Ask the healthcare provider to call the local or state health department.  Limit the number of people who have contact with the patient If possible, have only one caregiver for the patient. Other household members should stay in another home or place of residence. If this is not possible, they should stay in another room, or be separated from the patient as much as possible. Use a separate bathroom, if available. Restrict visitors who do not have an essential need to be in the home.  Keep older adults, very young children, and other sick people away from the patient Keep older adults, very young children, and those who have compromised immune systems or chronic health conditions away from the patient. This includes people with chronic heart, lung, or kidney conditions, diabetes, and cancer.  Ensure good ventilation Make sure that shared spaces in the home have good air flow, such as from an air conditioner or an opened window, weather permitting.  Wash your hands often Wash your hands often and thoroughly with soap and water for at least 20 seconds. You can use an alcohol based hand sanitizer if soap and water are not available and if your hands are not visibly dirty. Avoid touching your eyes, nose, and mouth with unwashed hands. Use disposable paper towels to dry your hands. If not available, use dedicated cloth towels and replace them when they become wet.  Wear a facemask and gloves Wear a disposable facemask at all times in the room and gloves when you touch or have contact with the patient's blood, body fluids, and/or secretions or excretions, such as sweat,  saliva, sputum, nasal mucus, vomit, urine, or feces.  Ensure the mask fits over your nose and mouth tightly, and do not touch it during use. Throw out disposable facemasks and gloves after using them. Do not reuse. Wash your hands immediately after removing your facemask and gloves. If your personal clothing becomes contaminated, carefully remove clothing and launder. Wash your hands after handling contaminated clothing. Place all used disposable facemasks, gloves, and other waste in a lined container before disposing them with other household waste. Remove gloves and wash your hands immediately after handling these items.  Do not share dishes, glasses, or other household items with the patient Avoid sharing household items. You should not share dishes, drinking glasses, cups, eating utensils, towels, bedding, or other items with a patient who is confirmed to have, or being evaluated for, COVID-19 infection. After the person uses these items, you should wash them thoroughly with soap and water.  Wash laundry thoroughly Immediately remove and wash clothes or bedding that have blood, body fluids, and/or secretions or excretions, such as sweat, saliva, sputum, nasal mucus, vomit, urine, or feces, on them. Wear gloves when handling laundry from the patient. Read and follow directions on labels of laundry or clothing items and detergent. In general, wash and dry  with the warmest temperatures recommended on the label.  Clean all areas the individual has used often Clean all touchable surfaces, such as counters, tabletops, doorknobs, bathroom fixtures, toilets, phones, keyboards, tablets, and bedside tables, every day. Also, clean any surfaces that may have blood, body fluids, and/or secretions or excretions on them. Wear gloves when cleaning surfaces the patient has come in contact with. Use a diluted bleach solution (e.g., dilute bleach with 1 part bleach and 10 parts water) or a household disinfectant  with a label that says EPA-registered for coronaviruses. To make a bleach solution at home, add 1 tablespoon of bleach to 1 quart (4 cups) of water. For a larger supply, add  cup of bleach to 1 gallon (16 cups) of water. Read labels of cleaning products and follow recommendations provided on product labels. Labels contain instructions for safe and effective use of the cleaning product including precautions you should take when applying the product, such as wearing gloves or eye protection and making sure you have good ventilation during use of the product. Remove gloves and wash hands immediately after cleaning.  Monitor yourself for signs and symptoms of illness Caregivers and household members are considered close contacts, should monitor their health, and will be asked to limit movement outside of the home to the extent possible. Follow the monitoring steps for close contacts listed on the symptom monitoring form.   ? If you have additional questions, contact your local health department or call the epidemiologist on call at [phone] (available 24/7). ? This guidance is subject to change. For the most up-to-date guidance from [facility], please refer to their website: [email or web address]

## 2021-03-28 NOTE — Progress Notes (Addendum)
Virtual Visit Note  I connected with Sanaai A Hulett on 03/28/21 at 10:00 AM EDT by phone and verified that I am speaking with the correct person using two identifiers.  Location: Patient: car Provider: [facility]   I discussed the limitations of evaluation and management by telemedicine and the availability of in person appointments. The patient expressed understanding and agreed to proceed.  History of Present Illness:  UPPER RESPIRATORY TRACT INFECTION - head congestion, nasal congestion with brown mucus, muffled hearing. - symptom onset yesterday - 4 doses of COVID vaccine, had COVID last year  Worst symptom: Fever:  yes, 101F yesterday Cough: yes, not new Shortness of breath: no Wheezing: no Chest pain: no Chest tightness: no Chest congestion: yes Nasal congestion: yes Runny nose: yes Post nasal drip: yes Sore throat: yes Swollen glands: yes Sinus pressure: yes Headache: yes Face pain: yes Ear pain: yes bilateral Ear pressure: yes bilateral Eyes red/itching:no Eye drainage/crusting: no  Vomiting: no Sick contacts: no Strep contacts: no  Relief with OTC cold/cough medications: no  Treatments attempted: ibuprofen, antihistamine   Observations/Objective:  Entirety of visit conducted over the phone.  Speaks in full sentences, no respiratory distress. Congested sounding.   Assessment and Plan:  URI Symptoms consistent with bacterial sinusitis, will provide amox. Testing done for strep and COVID, will call with results. Would be a candidate for oral antiviral if COVID positive. Reviewed OTC symptom relief, self-quarantine guidelines, and emergency precautions.      I discussed the assessment and treatment plan with the patient. The patient was provided an opportunity to ask questions and all were answered. The patient agreed with the plan and demonstrated an understanding of the instructions.   The patient was advised to call back or seek an in-person evaluation if  the symptoms worsen or if the condition fails to improve as anticipated.  I provided 7 minutes of non-face-to-face time during this encounter.   Caro Laroche, DO

## 2021-03-29 LAB — CULTURE, GROUP A STREP
MICRO NUMBER:: 12059263
SPECIMEN QUALITY:: ADEQUATE

## 2021-03-29 LAB — NOVEL CORONAVIRUS, NAA: SARS-CoV-2, NAA: NOT DETECTED

## 2021-03-29 LAB — SARS-COV-2, NAA 2 DAY TAT

## 2021-03-31 ENCOUNTER — Ambulatory Visit: Payer: Managed Care, Other (non HMO) | Admitting: Family Medicine

## 2021-05-01 ENCOUNTER — Other Ambulatory Visit: Payer: Self-pay | Admitting: Family Medicine

## 2021-05-10 ENCOUNTER — Ambulatory Visit: Payer: Self-pay | Admitting: *Deleted

## 2021-05-10 NOTE — Telephone Encounter (Signed)
Called pt back, BP now 115/70. Still with headache, "Not unusual for me,I get migraines, this is mild." Assured pt NT would route to practice for PCPs review and final disposition. Please advise:   [phone]

## 2021-05-10 NOTE — Telephone Encounter (Signed)
Reason for Disposition  [1] Systolic BP  >= 180 OR Diastolic >= 110 AND [2] missed most recent dose of blood pressure medication  Answer Assessment - Initial Assessment Questions 1. BLOOD PRESSURE: "What is the blood pressure?" "Did you take at least two measurements 5 minutes apart?"     184/117 2. ONSET: "When did you take your blood pressure?"     During call 3. HOW: "How did you obtain the blood pressure?" (e.g., visiting nurse, automatic home BP monitor)     Home monitor, arm 4. HISTORY: "Do you have a history of high blood pressure?"     Yes 5. MEDICATIONS: "Are you taking any medications for blood pressure?" "Have you missed any doses recently?"     Yes. Missed amlodipine for 2 nights 6. OTHER SYMPTOMS: "Do you have any symptoms?" (e.g., headache, chest pain, blurred vision, difficulty breathing, weakness)     Dizziness, headache, shaky,heart racing. 7. PREGNANCY: "Is there any chance you are pregnant?" "When was your last menstrual period?"     no  Protocols used: Blood Pressure - High-A-AH

## 2021-05-10 NOTE — Telephone Encounter (Signed)
Pt reports BP last night 210/111. States prior to call 184/117. States "Not great about taking my  Amlodipine at night." Has missed two doses. Did take PRN hydralazine last night "Fell asleep so not sure if helped."Reports headache, some dizziness. During call BP 178/98  HR 79. Has not taken PRN med. Advised to take dose of hydralazine now, rest. Will CB in 45 minutes to one hour. Placed on call back. Advised ED for worsening symptoms. Verbalizes understanding.

## 2021-05-11 NOTE — Progress Notes (Signed)
Name: Hannah Gregory   MRN: [id number]    DOB: 1980-12-04   Date:05/12/2021       Progress Note  Subjective  Chief Complaint  Hypertension  HPI  Hypertension Benign: her bp goes up and down, but it was concerning her last week when it went over 180, bp over the past couple of days has been back to normal. She has noticed intermittent episodes of dizziness when she stands up, but not every day. Advised to continue current medications, avoiding checking bp just because head is hurting, but to treat migraine when present   Daily headache/Migraine headaches: chronic, she states she has an appointment coming up soon. She states for the past week she has a headache described as a band and toothache , no nausea, vomiting. She has not tried [place] this week. She has also noticed some dizziness when she is in bed and turns to the right side. Not every time but happening over the past few days also. No hearing loss, fever or chills.   Arm paresthesia: she has noticed tingling and numbness on both hands and arms that is intermittent, not associated with weakness or pain, it radiates up to both arms. It can happen while at work but also at home, she is not sure of aggravating or alleviating factors   Anxiety: she feels nevous all the time, like her heart is racing, gets home at night feeling spent and goes to bed at 9 pm, she takes effexor, unable to afford psychiatrist or therapist. Stopped Abilify because it caused he to feel nauseated. She would like to help with anxiety, we will try prn buspar.   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  Family History  Problem Relation Age of Onset   Asthma Mother    Hypertension Mother    Hyperlipidemia Mother    Thyroid disease Mother    Heart attack Father    Migraines Father    Hypertension Father    Hyperlipidemia Father    Mental retardation Brother    COPD Brother    Hypertension Brother    Emphysema Brother    Breast cancer Maternal Grandmother    Breast cancer Maternal Aunt    Breast cancer Maternal Aunt     Social History   Tobacco Use   Smoking status: Every Day    Packs/day: 0.25    Years: 22.00    Pack years: 5.50    Types: Cigarettes    Start date: 10/01/1997   Smokeless tobacco: Never  Substance Use Topics   Alcohol use: Yes    Comment: occasionally     Current Outpatient Medications:    amLODipine (NORVASC) 2.5 MG tablet, Take 1 tablet (2.5 mg total) by mouth every evening., Disp: 90 tablet, Rfl: 0   ascorbic acid (VITAMIN C) 500 MG tablet, Take 500 mg by mouth daily., Disp: , Rfl:    atenolol-chlorthalidone (TENORETIC) 50-25 MG tablet, Take 1 tablet  by mouth daily., Disp: 90 tablet, Rfl: 0   cholecalciferol (VITAMIN D3) 25 MCG (1000 UNIT) tablet, Take 2,000 Units by mouth daily., Disp: , Rfl:    cyanocobalamin (,VITAMIN B-12,) 1000 MCG/ML injection, INJECT 1ML=1000MCG INTRAMUSCULARLY EVERY MONTH, Disp: 1 mL, Rfl: 11   Fremanezumab-vfrm (AJOVY) 225 MG/1.5ML SOAJ, Inject into the skin., Disp: , Rfl:    hydrALAZINE (APRESOLINE) 25 MG tablet, Take 1 tablet (25 mg total) by mouth 3 (three) times daily as needed (BP above 150/90)., Disp: 90 tablet, Rfl: 0   Ubrogepant (UBRELVY) 100 MG TABS, Take 1 tablet by mouth daily as needed. May take 2 hours later if no improvement, Disp: 10 tablet, Rfl: 0   valACYclovir (VALTREX) 1000 MG tablet, Take 2 tablets every 12 hours for 2  doses total, then stop taking.  Can take in the future if symptoms of cold sores just beginning to help blunt the response (Patient taking differently: as needed (cold sores). Take 2 tablets every 12 hours for 2 doses total, then stop taking.  Can take in the future if symptoms of cold sores just beginning to help blunt the response), Disp: 12 tablet, Rfl: 1   venlafaxine XR (EFFEXOR-XR) 75 MG 24 hr capsule, Take 1 capsule (75 mg total) by mouth daily with breakfast., Disp: 90 capsule, Rfl: 1   vitamin B-12 (CYANOCOBALAMIN) 1000 MCG tablet, Take 1,000 mcg by mouth daily., Disp: , Rfl:   Allergies  Allergen Reactions   Imitrex [Sumatriptan] Other (See Comments)    Side effect , chest pain, flushed    Chantix [Varenicline Tartrate]     nausea   Prednisone Hives and Other (See Comments)    Numbness of her face    Abilify [Aripiprazole] Nausea Only    I personally reviewed active problem list, medication list, allergies, family history, social history, health maintenance with the patient/caregiver today.   ROS  Constitutional: Negative for fever or weight change.  Respiratory: Negative for cough and shortness of breath.   Cardiovascular: Negative for chest pain or palpitations.  Gastrointestinal: Negative for abdominal pain, no bowel changes.  Musculoskeletal: Negative for gait problem or joint swelling.  Skin: Negative for rash.  Neurological: positive for headache and dizziness  No other specific complaints in a complete review of systems (except as listed in HPI above).   Objective  Vitals:   05/12/21 1053  BP: 120/72  Pulse: 72  Resp: 16  Temp: 98 F (36.7 C)  SpO2: 99%  Weight: 181 lb (82.1 kg)  Height: 5\' 5"  (1.651 m)    Body mass index is 30.12 kg/m.  Physical Exam  Constitutional: Patient appears well-developed and well-nourished. Obese  No distress.  HEENT: head atraumatic, normocephalic, pupils equal and reactive to light, ears normal TM bilaterally,  neck  supple Cardiovascular: Normal rate, regular rhythm and normal heart sounds.  No murmur heard. No BLE edema. Pulmonary/Chest: Effort normal and breath sounds normal. No respiratory distress. Abdominal: Soft.  There is no tenderness. Neurological: no nystagmus, normal grip, normal sensation of both arms  Muscular skeletal: no synovitis on hands, she states hands are puffy.  Psychiatric: Patient has a normal mood and affect. behavior is normal. Judgment and thought content normal.   Recent Results (from the past 2160 hour(s))  Novel Coronavirus, NAA ([facility])     Status: None   Collection Time: 03/28/21 12:00 AM   Specimen: Nasopharyngeal(NP) swabs in vial transport medium   Nasopharynge  Is this  Result Value Ref Range   SARS-CoV-2, NAA  Not Detected Not Detected    Comment: This nucleic acid amplification test was developed and its performance characteristics determined by [employer]. Nucleic acid amplification tests include RT-PCR and TMA. This test has not been FDA cleared or approved. This test has been authorized by FDA under an Emergency Use Authorization (EUA). This test is only authorized for the duration of time the declaration that circumstances exist justifying the authorization of the emergency use of in vitro diagnostic tests for detection of SARS-CoV-2 virus and/or diagnosis of COVID-19 infection under section 564(b)(1) of the Act, 21 U.S.C. 638GYK-5(L) (1), unless the authorization is terminated or revoked sooner. When diagnostic testing is negative, the possibility of a false negative result should be considered in the context of a patient's recent exposures and the presence of clinical signs and symptoms consistent with COVID-19. An individual without symptoms of COVID-19 and who is not shedding SARS-CoV-2 virus wo uld expect to have a negative (not detected) result in this assay.   SARS-COV-2, NAA 2 DAY TAT     Status: None   Collection Time: 03/28/21 12:00  AM   Nasopharynge  Is this  Result Value Ref Range   SARS-CoV-2, NAA 2 DAY TAT Performed   Culture, Group A Strep     Status: None   Collection Time: 03/28/21 10:18 AM   Specimen: Throat  Result Value Ref Range   MICRO NUMBER: [id number]    SPECIMEN QUALITY: Adequate    SOURCE: THROAT    STATUS: FINAL    RESULT: No group A Streptococcus isolated       PHQ2/9: Depression screen [facility] 2/9 05/12/2021 12/29/2020 09/07/2020 08/10/2020 05/03/2020  Decreased Interest 0 3 0 0 0  Down, Depressed, Hopeless 0 3 0 0 0  PHQ - 2 Score 0 6 0 0 0  Altered sleeping 0 0 0 - 0  Tired, decreased energy 3 3 0 - 0  Change in appetite 2 3 0 - 0  Feeling bad or failure about yourself  0 2 0 - 0  Trouble concentrating 0 0 0 - 0  Moving slowly or fidgety/restless 0 0 0 - 0  Suicidal thoughts 0 0 0 - 0  PHQ-9 Score 5 14 0 - 0  Difficult doing work/chores - - - - -  Some recent data might be hidden    phq 9 is negative    Fall Risk: Fall Risk  05/12/2021 12/29/2020 09/07/2020 08/10/2020 05/03/2020  Falls in the past year? 0 0 0 0 0  Number falls in past yr: 0 0 0 0 0  Injury with Fall? 0 0 0 0 0      Functional Status Survey: Is the patient deaf or have difficulty hearing?: No Does the patient have difficulty seeing, even when wearing glasses/contacts?: Yes Does the patient have difficulty concentrating, remembering, or making decisions?: No Does the patient have difficulty walking or climbing stairs?: No Does the patient have difficulty dressing or bathing?: No Does the patient have difficulty doing errands alone such as visiting a doctor's office or shopping?: No    Assessment & Plan  1. Dizziness  Seems to be BPPV and some of her symptoms seems orthostatic   2. Chronic daily headache  Needs to follow up with neurologist   3. Hypertension, benign  At goal today   4. Migraine with aura and without status migrainosus, not intractable  Continue Ajovy   5. Paresthesia of arm  Advised  to discuss it with neurologist   6. Arthralgia  of both hands  - Sedimentation rate - C-reactive protein - ANA,IFA RA Diag Pnl w/rflx Tit/Patn   7. Anxiety  - busPIRone (BUSPAR) 5 MG tablet; Take 1 tablet (5 mg total) by mouth 2 (two) times daily as needed.  Dispense: 60 tablet; Refill: 0

## 2021-05-11 NOTE — Telephone Encounter (Signed)
Pt is scheduled for Monday 05/15/21 and I told her that if we get a cancellation before Monday we would call her back and get her in sooner.

## 2021-05-12 ENCOUNTER — Ambulatory Visit: Payer: Managed Care, Other (non HMO) | Admitting: Family Medicine

## 2021-05-12 ENCOUNTER — Other Ambulatory Visit: Payer: Self-pay

## 2021-05-12 ENCOUNTER — Encounter: Payer: Self-pay | Admitting: Family Medicine

## 2021-05-12 VITALS — BP 120/72 | HR 72 | Temp 98.0°F | Resp 16 | Ht 65.0 in | Wt 181.0 lb

## 2021-05-12 DIAGNOSIS — F419 Anxiety disorder, unspecified: Secondary | ICD-10-CM

## 2021-05-12 DIAGNOSIS — I10 Essential (primary) hypertension: Secondary | ICD-10-CM | POA: Diagnosis not present

## 2021-05-12 DIAGNOSIS — M25541 Pain in joints of right hand: Secondary | ICD-10-CM

## 2021-05-12 DIAGNOSIS — M25542 Pain in joints of left hand: Secondary | ICD-10-CM

## 2021-05-12 DIAGNOSIS — R42 Dizziness and giddiness: Secondary | ICD-10-CM | POA: Diagnosis not present

## 2021-05-12 DIAGNOSIS — R519 Headache, unspecified: Secondary | ICD-10-CM

## 2021-05-12 DIAGNOSIS — R202 Paresthesia of skin: Secondary | ICD-10-CM

## 2021-05-12 DIAGNOSIS — G43109 Migraine with aura, not intractable, without status migrainosus: Secondary | ICD-10-CM | POA: Diagnosis not present

## 2021-05-12 MED ORDER — BUSPIRONE HCL 5 MG PO TABS: 5.0000 mg | ORAL_TABLET | Freq: Two times a day (BID) | ORAL | 0 refills | Status: DC | PRN

## 2021-05-12 NOTE — Patient Instructions (Signed)
How to Perform the Epley Maneuver The Epley maneuver is an exercise that relieves symptoms of vertigo. Vertigo is the feeling that you or your surroundings are moving when they are not. When you feel vertigo, you may feel like the room is spinning and may have trouble walking. The Epley maneuver is used for a type of vertigo caused by a calcium deposit in a part of the inner ear. The maneuver involves changing headpositions to help the deposit move out of the area. You can do this maneuver at home whenever you have symptoms of vertigo. You canrepeat it in 24 hours if your vertigo has not gone away. Even though the Epley maneuver may relieve your vertigo for a few weeks, it is possible that your symptoms will return. This maneuver relieves vertigo, but itdoes not relieve dizziness. What are the risks? If it is done correctly, the Epley maneuver is considered safe. Sometimes it can lead to dizziness or nausea that goes away after a short time. If you develop other symptoms--such as changes in vision, weakness, or numbness--stopdoing the maneuver and call your health care provider. Supplies needed: A bed or table. A pillow. How to do the Epley maneuver     Sit on the edge of a bed or table with your back straight and your legs extended or hanging over the edge of the bed or table. Turn your head halfway toward the affected ear or side as told by your health care provider. Lie backward quickly with your head turned until you are lying flat on your back. Your head should dangle (head-hanging position). You may want to position a pillow under your shoulders. Hold this position for at least 30 seconds. If you feel dizzy or have symptoms of vertigo, continue to hold the position until the symptoms stop. Turn your head to the opposite direction until your unaffected ear is facing down. Your head should continue to dangle. Hold this position for at least 30 seconds. If you feel dizzy or have symptoms of  vertigo, continue to hold the position until the symptoms stop. Turn your whole body to the same side as your head so that you are positioned on your side. Your head will now be nearly facedown and no longer needs to dangle. Hold for at least 30 seconds. If you feel dizzy or have symptoms of vertigo, continue to hold the position until the symptoms stop. Sit back up. You can repeat the maneuver in 24 hours if your vertigo does not go away. Follow these instructions at home: For 24 hours after doing the Epley maneuver: Keep your head in an upright position. When lying down to sleep or rest, keep your head raised (elevated) with two or more pillows. Avoid excessive neck movements. Activity Do not drive or use machinery if you feel dizzy. After doing the Epley maneuver, return to your normal activities as told by your health care provider. Ask your health care provider what activities are safe for you. General instructions Drink enough fluid to keep your urine pale yellow. Do not drink alcohol. Take over-the-counter and prescription medicines only as told by your health care provider. Keep all follow-up visits. This is important. Preventing vertigo symptoms Ask your health care provider if there is anything you should do at home to prevent vertigo. He or she may recommend that you: Keep your head elevated with two or more pillows while you sleep. Do not sleep on the side of your affected ear. Get up slowly from bed.   Avoid sudden movements during the day. Avoid extreme head positions or movement, such as looking up or bending over. Contact a health care provider if: Your vertigo gets worse. You have other symptoms, including: Nausea. Vomiting. Headache. Get help right away if you: Have vision changes. Have a headache or neck pain that is severe or getting worse. Cannot stop vomiting. Have new numbness or weakness in any part of your body. These symptoms may represent a serious problem  that is an emergency. Do not wait to see if the symptoms will go away. Get medical help right away. Call your local emergency services (911 in the [place] Do not drive yourself to the hospital. Summary Vertigo is the feeling that you or your surroundings are moving when they are not. The Epley maneuver is an exercise that relieves symptoms of vertigo. If the Epley maneuver is done correctly, it is considered safe. This information is not intended to replace advice given to you by your health care provider. Make sure you discuss any questions you have with your healthcare provider. Document Revised: 08/17/2020 Document Reviewed: 08/17/2020 Elsevier Patient Education  2022 Elsevier Inc.  

## 2021-05-15 ENCOUNTER — Ambulatory Visit: Payer: Managed Care, Other (non HMO) | Admitting: Family Medicine

## 2021-05-15 LAB — ANA,IFA RA DIAG PNL W/RFLX TIT/PATN
Anti Nuclear Antibody (ANA): NEGATIVE
Cyclic Citrullin Peptide Ab: <16 UNITS
Rheumatoid fact SerPl-aCnc: <14 IU/mL (ref <14)

## 2021-05-15 LAB — C-REACTIVE PROTEIN: CRP: 2.2 mg/L (ref <8.0)

## 2021-05-15 LAB — SEDIMENTATION RATE: Sed Rate: 9 mm/h (ref 0–20)

## 2021-05-31 ENCOUNTER — Encounter: Payer: Self-pay | Admitting: Family Medicine

## 2021-05-31 ENCOUNTER — Telehealth (INDEPENDENT_AMBULATORY_CARE_PROVIDER_SITE_OTHER): Payer: Managed Care, Other (non HMO) | Admitting: Family Medicine

## 2021-05-31 VITALS — Ht 66.0 in | Wt 181.0 lb

## 2021-05-31 DIAGNOSIS — R197 Diarrhea, unspecified: Secondary | ICD-10-CM | POA: Diagnosis not present

## 2021-05-31 MED ORDER — ONDANSETRON HCL 4 MG PO TABS: 4.0000 mg | ORAL_TABLET | Freq: Three times a day (TID) | ORAL | 0 refills | Status: DC | PRN

## 2021-05-31 MED ORDER — CHOLESTYRAMINE 4 G PO PACK: 4.0000 g | PACK | Freq: Three times a day (TID) | ORAL | 12 refills | Status: DC

## 2021-05-31 NOTE — Progress Notes (Signed)
Virtual Visit via Video Note  I connected with Hannah Gregory on 05/31/21 at 11:40 AM EDT by a video enabled telemedicine application and verified that I am speaking with the correct person using two identifiers.  Location: Patient: home Provider: [facility]   I discussed the limitations of evaluation and management by telemedicine and the availability of in person appointments. The patient expressed understanding and agreed to proceed.  History of Present Illness:   ABDOMINAL ISSUES - diarrhea, nausea, vomiting for the past few days - denies new, different foods, travel - denies blood, mucous in stool - has h/o loose stools since cholecystectomy but this is more frequent, feels goes to the bathroom about 20 times per day.  - denies cough, congestion, sore throat, fever, rash, weight loss, heartburn - also feels pea-sized "something" just outside her anus that is tender to touch, thinks she may have hemorrhoids. - has tried imodium with no relief.  - 4 doses of COVID vaccine, had COVID last year  Observations/Objective:  Well appearing, in NAD. Speaks in full sentences, no respiratory distress.  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 Will provide cholestyramine and zofran. Continue imodium prn. Will test for COVID, able to test at work, will call if positive. Would be a candidate for COVID treatment if positive. RTC if no better in a few days.    I discussed the assessment and treatment plan with the patient. The patient was provided an opportunity to ask questions and all were answered. The patient agreed with the plan and demonstrated an understanding of the instructions.   The patient was advised to call back or seek an in-person evaluation if the symptoms worsen or if the condition fails to improve as anticipated.  I provided 12 minutes of  non-face-to-face time during this encounter.   Caro Laroche, DO

## 2021-05-31 NOTE — Patient Instructions (Addendum)
It was great to see you! You likely have a GI bug causing your symptoms.   Our plans for today:  - Test for COVID and let us know if positive. - Take the zofran as needed for nausea. - Try the cholestyramine for your diarrhea. You can also continue to take the imodium as needed. - Once you are feeling better, come for an inperson appointment so we can take a look at your possible hemorrhoids.   Take care and seek immediate care sooner if you develop any concerns.   Dr. Linwood Dibbles

## 2021-06-29 ENCOUNTER — Other Ambulatory Visit: Payer: Self-pay | Admitting: Family Medicine

## 2021-06-29 DIAGNOSIS — I10 Essential (primary) hypertension: Secondary | ICD-10-CM

## 2021-07-03 ENCOUNTER — Other Ambulatory Visit: Payer: Self-pay | Admitting: Family Medicine

## 2021-07-03 ENCOUNTER — Telehealth: Payer: Self-pay | Admitting: Family Medicine

## 2021-07-03 DIAGNOSIS — R197 Diarrhea, unspecified: Secondary | ICD-10-CM

## 2021-07-03 NOTE — Telephone Encounter (Signed)
Pt called about the status of her message / advised of referral to GI/ pt asked if someone can call her to let her know the referral location information/ pt stated she is not able to go out in public and has a hard time at work due to the ongoing issue / please advise asap

## 2021-07-03 NOTE — Telephone Encounter (Signed)
Pt is calling to let Dr. Carlynn Purl know that the estyramine Lanetta Inch) 4 g packet [[id number]] is not working. Pt ends up having OTC diarrhea medication followingtaking the [place]. Pt would like to know would Dr. Carlynn Purl what to try another medication or refer pt to the GI doctor. Please advise CB- [phone] Preffered Pharmacy- [facility]

## 2021-07-27 ENCOUNTER — Other Ambulatory Visit: Payer: Self-pay | Admitting: Family Medicine

## 2021-07-27 ENCOUNTER — Encounter: Payer: Self-pay | Admitting: Family Medicine

## 2021-07-27 ENCOUNTER — Other Ambulatory Visit: Payer: Self-pay

## 2021-07-27 ENCOUNTER — Telehealth: Payer: Self-pay | Admitting: Family Medicine

## 2021-07-27 ENCOUNTER — Ambulatory Visit: Payer: Managed Care, Other (non HMO) | Admitting: Family Medicine

## 2021-07-27 VITALS — HR 77 | Resp 14 | Ht 65.0 in | Wt 181.0 lb

## 2021-07-27 DIAGNOSIS — B349 Viral infection, unspecified: Secondary | ICD-10-CM

## 2021-07-27 DIAGNOSIS — R509 Fever, unspecified: Secondary | ICD-10-CM

## 2021-07-27 DIAGNOSIS — J029 Acute pharyngitis, unspecified: Secondary | ICD-10-CM

## 2021-07-27 DIAGNOSIS — Z720 Tobacco use: Secondary | ICD-10-CM

## 2021-07-27 LAB — POCT INFLUENZA A/B
Influenza A, POC: NEGATIVE
Influenza B, POC: NEGATIVE

## 2021-07-27 MED ORDER — BENZONATATE 100 MG PO CAPS: 100.0000 mg | ORAL_CAPSULE | Freq: Three times a day (TID) | ORAL | 0 refills | Status: DC | PRN

## 2021-07-27 MED ORDER — NYSTATIN 100000 UNIT/ML MT SUSP: 10.0000 mL | Freq: Four times a day (QID) | OROMUCOSAL | 1 refills | Status: DC | PRN

## 2021-07-27 MED ORDER — DM-GUAIFENESIN ER 30-600 MG PO TB12: 1.0000 | ORAL_TABLET | Freq: Two times a day (BID) | ORAL | 0 refills | Status: DC

## 2021-07-27 NOTE — Progress Notes (Signed)
Name: Hannah Gregory   MRN: [id number]    DOB: 23-Jul-1981   Date:07/27/2021       Progress Note  Subjective  Chief Complaint  Chief Complaint  Patient presents with   Headache   Sore Throat   Generalized Body Aches    HPI  Viral Illness: she states symptoms two nights ago with body aches, restless night, low grade fever , vomiting so she missed work yesterday. She has chills, fatigue, sore throat, body aches, headaches, rhinorrhea, a productive cough but no sob or wheezing, decrease in appetite. She has been able to drink fluids .   She smokes, but no other risk factors , discussed possible side effects and emergency authorization for oral COVID anti virals. 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 No pertinent surgical history.  Family History  Problem Relation Age of Onset   Asthma Mother    Hypertension Mother    Hyperlipidemia Mother    Thyroid disease Mother    Heart attack Father    Migraines Father    Hypertension Father    Hyperlipidemia Father    Mental retardation Brother    COPD Brother    Hypertension Brother    Emphysema Brother    Breast cancer Maternal Grandmother    Breast cancer Maternal Aunt    Breast cancer Maternal Aunt     Social History   Tobacco Use   Smoking status: Every Day    Packs/day: 0.25    Years: 22.00     Pack years: 5.50    Types: Cigarettes    Start date: 10/01/1997   Smokeless tobacco: Never  Substance Use Topics   Alcohol use: Yes    Comment: occasionally     Current Outpatient Medications:    amLODipine (NORVASC) 2.5 MG tablet, Take 1 tablet (2.5 mg total) by mouth every evening., Disp: 90 tablet, Rfl: 0   ascorbic acid (VITAMIN C) 500 MG tablet, Take 500 mg by mouth daily., Disp: , Rfl:    atenolol-chlorthalidone (TENORETIC) 50-25 MG tablet, TAKE 1 TABLET BY MOUTH QD**BOTTLE*, Disp: 90 tablet, Rfl: 0   benzonatate (TESSALON) 100 MG capsule, Take 1-2 capsules (100-200 mg total) by mouth 3 (three) times daily as needed., Disp: 60 capsule, Rfl: 0   busPIRone (BUSPAR) 5 MG tablet, Take 1 tablet (5 mg total) by mouth 2 (two) times daily as needed., Disp: 60 tablet, Rfl: 0   cholecalciferol (VITAMIN D3) 25 MCG (1000 UNIT) tablet, Take 2,000 Units by mouth daily., Disp: , Rfl:    cholestyramine (QUESTRAN) 4 g packet, Take 1 packet (4 g total) by mouth 3 (three) times daily with meals., Disp: 60 each, Rfl: 12   cyanocobalamin (,VITAMIN B-12,) 1000 MCG/ML injection, INJECT 1ML=1000MCG INTRAMUSCULARLY EVERY MONTH, Disp: 1 mL, Rfl: 11   dextromethorphan-guaiFENesin (MUCINEX DM) 30-600 MG 12hr tablet, Take  1 tablet by mouth 2 (two) times daily., Disp: 60 tablet, Rfl: 0   Fremanezumab-vfrm (AJOVY) 225 MG/1.5ML SOAJ, Inject into the skin., Disp: , Rfl:    hydrALAZINE (APRESOLINE) 25 MG tablet, Take 1 tablet (25 mg total) by mouth 3 (three) times daily as needed (BP above 150/90)., Disp: 90 tablet, Rfl: 0   magic mouthwash (nystatin, lidocaine, diphenhydrAMINE, alum & mag hydroxide) suspension, Swish and spit 10 mLs 4 (four) times daily as needed for mouth pain., Disp: 180 mL, Rfl: 1   ondansetron (ZOFRAN) 4 MG tablet, Take 1 tablet (4 mg total) by mouth every 8 (eight) hours as needed for nausea or vomiting., Disp: 20 tablet, Rfl: 0   Ubrogepant (UBRELVY) 100 MG TABS, Take 1 tablet by mouth daily as  needed. May take 2 hours later if no improvement, Disp: 10 tablet, Rfl: 0   valACYclovir (VALTREX) 1000 MG tablet, Take 2 tablets every 12 hours for 2 doses total, then stop taking.  Can take in the future if symptoms of cold sores just beginning to help blunt the response (Patient taking differently: as needed (cold sores). Take 2 tablets every 12 hours for 2 doses total, then stop taking.  Can take in the future if symptoms of cold sores just beginning to help blunt the response), Disp: 12 tablet, Rfl: 1   venlafaxine XR (EFFEXOR-XR) 75 MG 24 hr capsule, Take 1 capsule (75 mg total) by mouth daily with breakfast., Disp: 90 capsule, Rfl: 1   vitamin B-12 (CYANOCOBALAMIN) 1000 MCG tablet, Take 1,000 mcg by mouth daily., Disp: , Rfl:   Allergies  Allergen Reactions   Imitrex [Sumatriptan] Other (See Comments)    Side effect , chest pain, flushed    Chantix [Varenicline Tartrate]     nausea   Prednisone Hives and Other (See Comments)    Numbness of her face    Abilify [Aripiprazole] Nausea Only    I personally reviewed active problem list, medication list, allergies, family history, social history with the patient/caregiver today.   ROS  Ten systems reviewed and is negative except as mentioned in HPI   Objective  Vitals:   07/27/21 0927 07/27/21 0958  Pulse:  77  Resp:  14  SpO2:  93%  Weight: 181 lb (82.1 kg)     Body mass index is 29.21 kg/m.  Physical Exam  Constitutional: Patient appears well-developed and well-nourished. Overweight.  No distress.  HEENT: head atraumatic, normocephalic, pupils equal and reactive to light, neck supple, throat within normal limits/mild erythema  Cardiovascular: Normal rate, regular rhythm and normal heart sounds.  No murmur heard. No BLE edema. Pulmonary/Chest: Effort normal and breath sounds normal. No respiratory distress. Abdominal: Soft.  There is no tenderness. Psychiatric: Patient has a normal mood and affect. behavior is normal.  Judgment and thought content normal.   Recent Results (from the past 2160 hour(s))  Sedimentation rate     Status: None   Collection Time: 05/12/21 11:56 AM  Result Value Ref Range   Sed Rate 9 0 - 20 mm/h  C-reactive protein     Status: None   Collection Time: 05/12/21 11:56 AM  Result Value Ref Range   CRP 2.2 <8.0 mg/L  ANA,IFA RA Diag Pnl w/rflx Tit/Patn     Status: None   Collection Time: 05/12/21 11:56 AM  Result Value Ref Range   Anti Nuclear Antibody (ANA) NEGATIVE NEGATIVE    Comment: ANA IFA is a first line screen for detecting the presence of up to  approximately 150 autoantibodies in various autoimmune diseases. A negative ANA IFA result suggests an ANA-associated autoimmune disease is not present at this time, but is not definitive. If there is high clinical suspicion for Sjogren's syndrome, testing for anti-SS-A/Ro antibody should be considered. Anti-Jo-1 antibody should be considered for clinically suspected inflammatory myopathies. . AC-0: Negative . International Consensus on ANA Patterns (SeverTies.uy) . For additional information, please refer to http://education.[facility].com/faq/FAQ177 (This link is being provided for informational/ educational purposes only.) .    Rhuematoid fact SerPl-aCnc <14 <14 IU/mL   Cyclic Citrullin Peptide Ab <63 UNITS    Comment: Reference Range Negative:            <20 Weak Positive:       20-39 Moderate Positive:   40-59 Strong Positive:     >59 .    INTERPRETATION      Comment: . There is no serologic evidence for rheumatoid arthritis. The RF and CCP tests each have a sensitivity for established rheumatoid arthritis of approximately 65-70%. Marland Kitchen   POCT Influenza A/B     Status: Normal   Collection Time: 07/27/21  9:34 AM  Result Value Ref Range   Influenza A, POC Negative Negative   Influenza B, POC Negative Negative     PHQ2/9: Depression screen [facility] 2/9 07/27/2021 05/31/2021  05/12/2021 12/29/2020 09/07/2020  Decreased Interest 0 0 0 3 0  Down, Depressed, Hopeless 0 0 0 3 0  PHQ - 2 Score 0 0 0 6 0  Altered sleeping 0 0 0 0 0  Tired, decreased energy 0 0 3 3 0  Change in appetite 0 0 2 3 0  Feeling bad or failure about yourself  0 0 0 2 0  Trouble concentrating 0 0 0 0 0  Moving slowly or fidgety/restless 0 0 0 0 0  Suicidal thoughts 0 0 0 0 0  PHQ-9 Score 0 0 5 14 0  Difficult doing work/chores - Not difficult at all - - -  Some recent data might be hidden    phq 9 is negative   Fall Risk: Fall Risk  07/27/2021 05/31/2021 05/12/2021 12/29/2020 09/07/2020  Falls in the past year? 0 0 0 0 0  Number falls in past yr: - 0 0 0 0  Injury with Fall? - 0 0 0 0  Follow up Falls prevention discussed - - - -      Functional Status Survey: Is the patient deaf or have difficulty hearing?: No Does the patient have difficulty seeing, even when wearing glasses/contacts?: No Does the patient have difficulty concentrating, remembering, or making decisions?: No Does the patient have difficulty walking or climbing stairs?: No Does the patient have difficulty dressing or bathing?: No Does the patient have difficulty doing errands alone such as visiting a doctor's office or shopping?: No    Assessment & Plan  1. Fever, unspecified fever cause  - POCT Influenza A/B - Novel Coronavirus, NAA ([facility]) - benzonatate (TESSALON) 100 MG capsule; Take 1-2 capsules (100-200 mg total) by mouth 3 (three) times daily as needed.  Dispense: 60 capsule; Refill: 0 - dextromethorphan-guaiFENesin (MUCINEX DM) 30-600 MG 12hr tablet; Take 1 tablet by mouth 2 (two) times daily.  Dispense: 60 tablet; Refill: 0 - magic mouthwash (nystatin, lidocaine, diphenhydrAMINE, alum & mag hydroxide) suspension; Swish and spit 10 mLs 4 (four) times daily as needed for mouth pain.  Dispense: 180 mL; Refill: 1  2. Sore throat  - POCT Influenza A/B - Novel Coronavirus, NAA ([facility]) -  benzonatate  (TESSALON) 100 MG capsule; Take 1-2 capsules (100-200 mg total) by mouth 3 (three) times daily as needed.  Dispense: 60 capsule; Refill: 0 - dextromethorphan-guaiFENesin (MUCINEX DM) 30-600 MG 12hr tablet; Take 1 tablet by mouth 2 (two) times daily.  Dispense: 60 tablet; Refill: 0 - magic mouthwash (nystatin, lidocaine, diphenhydrAMINE, alum & mag hydroxide) suspension; Swish and spit 10 mLs 4 (four) times daily as needed for mouth pain.  Dispense: 180 mL; Refill: 1  3. Viral illness  - benzonatate (TESSALON) 100 MG capsule; Take 1-2 capsules (100-200 mg total) by mouth 3 (three) times daily as needed.  Dispense: 60 capsule; Refill: 0 - dextromethorphan-guaiFENesin (MUCINEX DM) 30-600 MG 12hr tablet; Take 1 tablet by mouth 2 (two) times daily.  Dispense: 60 tablet; Refill: 0 - magic mouthwash (nystatin, lidocaine, diphenhydrAMINE, alum & mag hydroxide) suspension; Swish and spit 10 mLs 4 (four) times daily as needed for mouth pain.  Dispense: 180 mL; Refill: 1  Discussed social isolation, fluids, rest and wait for test results   4. Tobacco use   Advised to quit

## 2021-07-27 NOTE — Telephone Encounter (Signed)
Dillan from [facility] called to as can they give patient 240 mg of magic mouth wash instead of the 45 that is prescribed because it will ask 6 days, typically make this an 8 ounce bottle. Please call back

## 2021-07-28 ENCOUNTER — Encounter: Payer: Self-pay | Admitting: Gastroenterology

## 2021-07-28 ENCOUNTER — Ambulatory Visit (INDEPENDENT_AMBULATORY_CARE_PROVIDER_SITE_OTHER): Payer: Managed Care, Other (non HMO) | Admitting: Gastroenterology

## 2021-07-28 ENCOUNTER — Other Ambulatory Visit: Payer: Self-pay

## 2021-07-28 VITALS — BP 149/91 | HR 76 | Temp 98.2°F | Ht 65.0 in | Wt 185.6 lb

## 2021-07-28 DIAGNOSIS — R197 Diarrhea, unspecified: Secondary | ICD-10-CM

## 2021-07-28 DIAGNOSIS — K529 Noninfective gastroenteritis and colitis, unspecified: Secondary | ICD-10-CM

## 2021-07-28 LAB — NOVEL CORONAVIRUS, NAA: SARS-CoV-2, NAA: NOT DETECTED

## 2021-07-28 LAB — SARS-COV-2, NAA 2 DAY TAT

## 2021-07-28 NOTE — Patient Instructions (Signed)
We gave you restora samples today please let us know if this helps and we will call in a perscription

## 2021-07-28 NOTE — Progress Notes (Signed)
[name], MD [address]  [address]  [place], [place] [address]  Main: [phone]  Fax: [phone]    Gastroenterology Consultation  Referring Provider:     Alba Cory, MD Primary Care Physician:  Alba Cory, MD Primary Gastroenterologist:  Dr. [name] Reason for Consultation:     Chronic diarrhea        HPI:   Hannah Gregory is a 40 y.o. female referred by Dr. Alba Cory, MD  for consultation & management of chronic diarrhea.  Patient underwent laparoscopic cholecystectomy for acute calculus cholecystitis on 09/09/2020.  Since then, patient reports that she has been experiencing profuse nonbloody diarrhea, 10-12 times daily and also few times at night.  She does report abdominal bloating and generalized abdominal discomfort.  She reports the bowel movements are watery, small to moderate amounts.  Prior to cholecystectomy, she used to experience postprandial urgency only.  She has tried cholestyramine, 1-2 times daily, unable to tolerated because of the taste.  She is also taking Imodium as needed.  She does not have any weight loss, in fact gained 4 to 5 pounds.  She drinks Pepsi 1 to 2 cans daily, she says she has cut back on carbonated beverages.  Her CT abdomen and pelvis after cholecystectomy was unrevealing.  She has history of severe migraine headaches.  She denies any new medications within last 1 year.  Has mild B12 deficiency, on monthly B12 shots.  CBC, TSH, CMP are normal  Patient does not smoke or drink alcohol  Patient has significant stress in her life.  NSAIDs: None  Antiplts/Anticoagulants/Anti thrombotics: None  GI Procedures: None Family history of pernicious anemia, she denies any family history of GI malignancy  Past Medical History:  Diagnosis Date   Family history of breast cancer    10/20 genetic testing letter sent   Migraine    Scoliosis     History reviewed. No pertinent surgical history.  Current  Outpatient Medications:    amLODipine (NORVASC) 2.5 MG tablet, Take 1 tablet (2.5 mg total) by mouth every evening., Disp: 90 tablet, Rfl: 0   ascorbic acid (VITAMIN C) 500 MG tablet, Take 500 mg by mouth daily., Disp: , Rfl:    atenolol-chlorthalidone (TENORETIC) 50-25 MG tablet, TAKE 1 TABLET BY MOUTH QD**BOTTLE*, Disp: 90 tablet, Rfl: 0   benzonatate (TESSALON) 100 MG capsule, Take 1-2 capsules (100-200 mg total) by mouth 3 (three) times daily as needed., Disp: 60 capsule, Rfl: 0   busPIRone (BUSPAR) 5 MG tablet, Take 1 tablet (5 mg total) by mouth 2 (two) times daily as needed., Disp: 60 tablet, Rfl: 0   cholecalciferol (VITAMIN D3) 25 MCG (1000 UNIT) tablet, Take 2,000 Units by mouth daily., Disp: , Rfl:    cholestyramine (QUESTRAN) 4 g packet, Take 1 packet (4 g total) by mouth 3 (three) times daily with meals., Disp: 60 each, Rfl: 12   cyanocobalamin (,VITAMIN B-12,) 1000 MCG/ML injection, INJECT 1ML=1000MCG INTRAMUSCULARLY EVERY MONTH, Disp: 1 mL, Rfl: 11   dextromethorphan-guaiFENesin (MUCINEX DM) 30-600 MG 12hr tablet, Take 1 tablet by mouth 2 (two) times daily., Disp: 60 tablet, Rfl: 0   Fremanezumab-vfrm (AJOVY) 225 MG/1.5ML SOAJ, Inject into the skin., Disp: , Rfl:    hydrALAZINE (APRESOLINE) 25 MG tablet, Take 1 tablet (25 mg total) by mouth 3 (three) times daily as needed (BP above 150/90)., Disp: 90 tablet, Rfl: 0   magic mouthwash (nystatin, lidocaine, diphenhydrAMINE, alum & mag hydroxide) suspension, Swish and spit  10 mLs 4 (four) times daily as needed for mouth pain., Disp: 180 mL, Rfl: 1   Ubrogepant (UBRELVY) 100 MG TABS, Take 1 tablet by mouth daily as needed. May take 2 hours later if no improvement, Disp: 10 tablet, Rfl: 0   valACYclovir (VALTREX) 1000 MG tablet, Take 2 tablets every 12 hours for 2 doses total, then stop taking.  Can take in the future if symptoms of cold sores just beginning to help blunt the response (Patient taking differently: as needed (cold sores). Take 2  tablets every 12 hours for 2 doses total, then stop taking.  Can take in the future if symptoms of cold sores just beginning to help blunt the response), Disp: 12 tablet, Rfl: 1   venlafaxine XR (EFFEXOR-XR) 75 MG 24 hr capsule, Take 1 capsule (75 mg total) by mouth daily with breakfast., Disp: 90 capsule, Rfl: 1   vitamin B-12 (CYANOCOBALAMIN) 1000 MCG tablet, Take 1,000 mcg by mouth daily., Disp: , Rfl:    ondansetron (ZOFRAN) 4 MG tablet, Take 1 tablet (4 mg total) by mouth every 8 (eight) hours as needed for nausea or vomiting. (Patient not taking: Reported on 07/28/2021), Disp: 20 tablet, Rfl: 0    Family History  Problem Relation Age of Onset   Asthma Mother    Hypertension Mother    Hyperlipidemia Mother    Thyroid disease Mother    Heart attack Father    Migraines Father    Hypertension Father    Hyperlipidemia Father    Mental retardation Brother    COPD Brother    Hypertension Brother    Emphysema Brother    Breast cancer Maternal Grandmother    Breast cancer Maternal Aunt    Breast cancer Maternal Aunt      Social History   Tobacco Use   Smoking status: Every Day    Packs/day: 0.25    Years: 22.00    Pack years: 5.50    Types: Cigarettes    Start date: 10/01/1997   Smokeless tobacco: Never  Vaping Use   Vaping Use: Never used  Substance Use Topics   Alcohol use: Yes    Comment: occasionally   Drug use: No    Allergies as of 07/28/2021 - Review Complete 07/28/2021  Allergen Reaction Noted   Imitrex [sumatriptan] Other (See Comments) 06/11/2018   Chantix [varenicline tartrate]  03/26/2018   Prednisone Hives and Other (See Comments) 11/29/2016   Abilify [aripiprazole] Nausea Only 05/12/2021    Review of Systems:    All systems reviewed and negative except where noted in HPI.   Physical Exam:  BP (!) 149/91 (BP Location: Left Arm, Patient Position: Sitting, Cuff Size: Normal)   Pulse 76   Temp 98.2 F (36.8 C) (Temporal)   Ht 5\' 5"  (1.651 m)   Wt 185  lb 9.6 oz (84.2 kg)   BMI 30.89 kg/m  No LMP recorded. (Menstrual status: Irregular Periods).  General:   Alert,  Well-developed, well-nourished, pleasant and cooperative in NAD Head:  Normocephalic and atraumatic. Eyes:  Sclera clear, no icterus.   Conjunctiva pink. Ears:  Normal auditory acuity. Nose:  No deformity, discharge, or lesions. Mouth:  No deformity or lesions,oropharynx pink & moist. Neck:  Supple; no masses or thyromegaly. Lungs:  Respirations even and unlabored.  Clear throughout to auscultation.   No wheezes, crackles, or rhonchi. No acute distress. Heart:  Regular rate and rhythm; no murmurs, clicks, rubs, or gallops. Abdomen:  Normal bowel sounds. Soft, non-tender and mildly  distended without masses, hepatosplenomegaly or hernias noted.  No guarding or rebound tenderness.   Rectal: Not performed Msk:  Symmetrical without gross deformities. Good, equal movement & strength bilaterally. Pulses:  Normal pulses noted. Extremities:  No clubbing or edema.  No cyanosis. Neurologic:  Alert and oriented x3;  grossly normal neurologically. Skin:  Intact without significant lesions or rashes. No jaundice. Psych:  Alert and cooperative. Normal mood and affect.  Imaging Studies: Reviewed  Assessment and Plan:   Monserath A Rolston is a 40 y.o. pleasant Caucasian female with history of stress/anxiety on buspirone and Effexor, chronic migraine headaches is seen in consultation for chronic nonbloody diarrhea of unknown origin which has worsened since cholecystectomy in 12/21  Chronic diarrhea of unknown origin Check GI profile PCR, pancreatic fecal elastase levels, calprotectin levels Check H. pylori stool antigen, celiac panel Discussed about lactose-free diet, information provided Advised to completely eliminate carbonated beverages Restora probiotic samples provided Okay to continue cholestyramine and Imodium pending above work-up If above work-up is unremarkable, recommend upper  endoscopy and colonoscopy for further evaluation   Follow up in 3 to 4 months and contact via MyChart as needed   [name], MD

## 2021-07-31 DIAGNOSIS — R519 Headache, unspecified: Secondary | ICD-10-CM | POA: Insufficient documentation

## 2021-07-31 DIAGNOSIS — H539 Unspecified visual disturbance: Secondary | ICD-10-CM | POA: Insufficient documentation

## 2021-08-01 LAB — CELIAC DISEASE PANEL
Endomysial IgA: NEGATIVE
IgA/Immunoglobulin A, Serum: 212 mg/dL (ref 87–352)
Transglutaminase IgA: <2 U/mL (ref 0–3)

## 2021-08-01 LAB — H. PYLORI ANTIGEN, STOOL: H pylori Ag, Stl: NEGATIVE

## 2021-08-02 LAB — GI PROFILE, STOOL, PCR

## 2021-08-02 LAB — CALPROTECTIN, FECAL: Calprotectin, Fecal: <16 ug/g (ref 0–120)

## 2021-08-02 LAB — PANCREATIC ELASTASE, FECAL: Pancreatic Elastase, Fecal: <50 ug Elast./g — ABNORMAL LOW (ref >200)

## 2021-08-03 ENCOUNTER — Encounter: Payer: Self-pay | Admitting: Gastroenterology

## 2021-08-04 ENCOUNTER — Telehealth: Payer: Self-pay

## 2021-08-04 NOTE — Telephone Encounter (Signed)
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  Recommend Creon or Zenpep 1 capsule with first bite of each meal and 1 with snack Patient can try samples first  RV

## 2021-08-04 NOTE — Telephone Encounter (Signed)
Called patient home phone no answer

## 2021-08-04 NOTE — Telephone Encounter (Signed)
Called patient no answer left voicemail for a call back

## 2021-08-04 NOTE — Telephone Encounter (Signed)
Called patient about results and let her know we have samples at front desk for her at the [place] office for her to pickup

## 2021-08-07 ENCOUNTER — Telehealth: Payer: Self-pay

## 2021-08-07 MED ORDER — PANCRELIPASE (LIP-PROT-AMYL) 36000-114000 UNITS PO CPEP: ORAL_CAPSULE | ORAL | 11 refills | Status: DC

## 2021-08-07 MED ORDER — PANCRELIPASE (LIP-PROT-AMYL) 36000-114000 UNITS PO CPEP: ORAL_CAPSULE | ORAL | 3 refills | Status: DC

## 2021-08-07 NOTE — Telephone Encounter (Signed)
Called patient confirmed pharmacy and sent in prescription for creon as requested

## 2021-08-08 ENCOUNTER — Other Ambulatory Visit: Payer: Self-pay | Admitting: Physical Medicine & Rehabilitation

## 2021-08-08 ENCOUNTER — Other Ambulatory Visit (HOSPITAL_COMMUNITY): Payer: Self-pay | Admitting: Physical Medicine & Rehabilitation

## 2021-08-08 DIAGNOSIS — M542 Cervicalgia: Secondary | ICD-10-CM

## 2021-08-11 NOTE — Progress Notes (Signed)
Name: Hannah Gregory   MRN: [id number]    DOB: May 04, 1981   Date:08/14/2021       Progress Note  Subjective  Chief Complaint  Follow Up  HPI  Hypertension Benign: BP oscillates but has been doing better lately. No chst pain or palpitation Taking Tenoretic half bid and denies cramping, discussed high potassium diet and recheck labs during CPE soon   Daily headache/Migraine headaches: chronic, she is under the care of neurologist, Dr. Malvin Johns and his PA. Taking higher dose of Effexor, Ajovy also Ubrelvy. She still has daily headaches but not migraines. She is going to have MRI of neck soon since since lack of lordosis of c-spine on x-ray and paresthesias both arms. She had a normal MRI brain 2021   AnxietyMDD  she is on higher dose of Effexor given by neurologist and is feeling better, took only a few of the buspar and seems to work, continue prn use. She has a long history of depression, worse when father had recent bypass surgery about 3 years ago. She used to feel very overwhelmed as single mother, but coping better. Effexor has been helpful. Denies crying spells.    Arthralgia: both hands feels sore and puffy, she works at a pharmacy, we checked for inflammatory arthritis and negative studies   Low pancreatic Elastase: seen by Dr. Allegra Lai and diagnosed with exocrine pancreatic insuffiencey and was given Creon to take before meals. She states abdominal has decreased significantly also diarrhea not as frequent. She just started medications about one week ago.   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  Family History  Problem Relation Age of Onset   Asthma Mother    Hypertension Mother    Hyperlipidemia Mother    Thyroid disease Mother    Heart attack Father    Migraines Father    Hypertension Father    Hyperlipidemia Father    Mental retardation Brother    COPD Brother    Hypertension Brother    Emphysema Brother    Breast cancer Maternal Grandmother    Breast cancer Maternal Aunt    Breast cancer Maternal Aunt     Social History   Tobacco Use   Smoking status: Every Day    Packs/day: 0.25    Years: 22.00    Pack years: 5.50    Types: Cigarettes    Start date: 10/01/1997   Smokeless tobacco: Never  Substance Use Topics   Alcohol use: Yes    Comment: occasionally     Current Outpatient Medications:    ascorbic acid (VITAMIN C) 500 MG tablet, Take 500 mg by mouth daily., Disp: , Rfl:    busPIRone (BUSPAR) 5 MG tablet, Take 1 tablet (5 mg total) by mouth 2 (two) times daily as needed., Disp: 60 tablet, Rfl: 0   cholecalciferol (VITAMIN D3) 25 MCG (1000 UNIT) tablet, Take 2,000 Units by mouth daily., Disp: , Rfl:    cyanocobalamin (,VITAMIN B-12,) 1000 MCG/ML injection,  INJECT 1ML=1000MCG INTRAMUSCULARLY EVERY MONTH, Disp: 1 mL, Rfl: 11   Fremanezumab-vfrm (AJOVY) 225 MG/1.5ML SOAJ, Inject into the skin., Disp: , Rfl:    hydrALAZINE (APRESOLINE) 25 MG tablet, Take 1 tablet (25 mg total) by mouth 3 (three) times daily as needed (BP above 150/90)., Disp: 90 tablet, Rfl: 0   lipase/protease/amylase (CREON) 36000 UNITS CPEP capsule, Take 1 capsule (36,000 Units total) by mouth 3 (three) times daily with meals. May also take 1 capsule (36,000 Units total) as needed (with snacks)., Disp: 240 capsule, Rfl: 3   Ubrogepant (UBRELVY) 100 MG TABS, Take 1 tablet by mouth daily as needed. May take 2 hours later if no improvement, Disp: 10  tablet, Rfl: 0   valACYclovir (VALTREX) 1000 MG tablet, Take 2 tablets every 12 hours for 2 doses total, then stop taking.  Can take in the future if symptoms of cold sores just beginning to help blunt the response (Patient taking differently: as needed (cold sores). Take 2 tablets every 12 hours for 2 doses total, then stop taking.  Can take in the future if symptoms of cold sores just beginning to help blunt the response), Disp: 12 tablet, Rfl: 1   venlafaxine XR (EFFEXOR-XR) 150 MG 24 hr capsule, Take 150 mg by mouth daily with breakfast., Disp: , Rfl:    vitamin B-12 (CYANOCOBALAMIN) 1000 MCG tablet, Take 1,000 mcg by mouth daily., Disp: , Rfl:    atenolol-chlorthalidone (TENORETIC) 50-25 MG tablet, Take 0.5 tablets by mouth 2 (two) times daily., Disp: 90 tablet, Rfl: 0  Allergies  Allergen Reactions   Imitrex [Sumatriptan] Other (See Comments)    Side effect , chest pain, flushed    Chantix [Varenicline Tartrate]     nausea   Prednisone Hives and Other (See Comments)    Numbness of her face    Abilify [Aripiprazole] Nausea Only    I personally reviewed active problem list, medication list, allergies, family history, social history, health maintenance with the patient/caregiver today.   ROS  Ten systems reviewed and is negative except as mentioned in HPI   Objective  Vitals:   08/14/21 0910  BP: 134/86  Pulse: 76  Resp: 16  Temp: 98.3 F (36.8 C)  SpO2: 99%  Weight: 183 lb (83 kg)  Height: 5\' 5"  (1.651 m)    Body mass index is 30.45 kg/m.  Physical Exam  Constitutional: Patient appears well-developed and well-nourished. Obese  No distress.  HEENT: head atraumatic, normocephalic, pupils equal and reactive to light, neck supple Cardiovascular: Normal rate, regular rhythm and normal heart sounds.  No murmur heard. No BLE edema. Pulmonary/Chest: Effort normal and breath sounds normal. No respiratory distress. Abdominal: Soft.  There is no tenderness. Psychiatric:  Patient has a normal mood and affect. behavior is normal. Judgment and thought content normal.   Recent Results (from the past 2160 hour(s))  Novel Coronavirus, NAA ([facility])     Status: None   Collection Time: 07/27/21  9:32 AM  Result Value Ref Range   SARS-CoV-2, NAA Not Detected Not Detected    Comment: This nucleic acid amplification test was developed and its performance characteristics determined by [employer]. Nucleic acid amplification tests include RT-PCR and TMA. This test has not been FDA cleared or approved. This test has been authorized by FDA under an Emergency Use Authorization (EUA). This test is only authorized for the duration of time the declaration that circumstances exist justifying the authorization of the emergency use of in vitro diagnostic tests for detection of  SARS-CoV-2 virus and/or diagnosis of COVID-19 infection under section 564(b)(1) of the Act, 21 U.S.C. 696EXB-2(W360bbb-3(b) (1), unless the authorization is terminated or revoked sooner. When diagnostic testing is negative, the possibility of a false negative result should be considered in the context of a patient's recent exposures and the presence of clinical signs and symptoms consistent with COVID-19. An individual without symptoms of COVID-19 and who is not shedding SARS-CoV-2 virus wo uld expect to have a negative (not detected) result in this assay.   SARS-COV-2, NAA 2 DAY TAT     Status: None   Collection Time: 07/27/21  9:32 AM  Result Value Ref Range   SARS-CoV-2, NAA 2 DAY TAT Performed   POCT Influenza A/B     Status: Normal   Collection Time: 07/27/21  9:34 AM  Result Value Ref Range   Influenza A, POC Negative Negative   Influenza B, POC Negative Negative  Celiac Disease Panel     Status: None   Collection Time: 07/28/21  9:28 AM  Result Value Ref Range   Endomysial IgA Negative Negative   Transglutaminase IgA <2 0 - 3 U/mL    Comment:                               Negative         0 -  3                               Weak Positive   4 - 10                               Positive           >10  Tissue Transglutaminase (tTG) has been identified  as the endomysial antigen.  Studies have demonstr-  ated that endomysial IgA antibodies have over 99%  specificity for gluten sensitive enteropathy.    IgA/Immunoglobulin A, Serum 212 87 - 352 mg/dL  Calprotectin, Fecal     Status: None   Collection Time: 07/28/21  2:30 PM   Specimen: Stool  Result Value Ref Range   Calprotectin, Fecal <16 0 - 120 ug/g    Comment: Concentration     Interpretation   Follow-Up <16 - 50 ug/g     Normal           None >50 -120 ug/g     Borderline       Re-evaluate in 4-6 weeks     >120 ug/g     Abnormal         Repeat as clinically                                    indicated   GI Profile, Stool, PCR     Status: None   Collection Time: 07/28/21  2:30 PM  Result Value Ref Range   Campylobacter Not Detected Not Detected   C difficile toxin A/B Not Detected Not Detected   Plesiomonas shigelloides Not Detected Not Detected   Salmonella Not Detected Not Detected   Vibrio Not Detected Not Detected   Vibrio cholerae Not Detected Not Detected   Yersinia enterocolitica Not Detected Not Detected   Enteroaggregative E coli Not Detected Not Detected   Enteropathogenic E  coli Not Detected Not Detected   Enterotoxigenic E coli Not Detected Not Detected   Shiga-toxin-producing E coli Not Detected Not Detected   E coli [id number] Not applicable Not Detected   Shigella/Enteroinvasive E coli Not Detected Not Detected   Cryptosporidium Not Detected Not Detected   Cyclospora cayetanensis Not Detected Not Detected   Entamoeba histolytica Not Detected Not Detected   Giardia lamblia Not Detected Not Detected   Adenovirus F 40/41 Not Detected Not Detected   Astrovirus Not Detected Not Detected   Norovirus GI/GII Not Detected Not Detected   Rotavirus A Not Detected Not Detected   Sapovirus Not Detected Not  Detected  Pancreatic elastase, fecal     Status: Abnormal   Collection Time: 07/28/21  2:30 PM  Result Value Ref Range   Pancreatic Elastase, Fecal <50 (L) >200 ug Elast./g    Comment: **Results verified by repeat testing**        Severe Pancreatic Insufficiency:          <100        Moderate Pancreatic Insufficiency:   100 - 200        Normal:                                   >200   H. pylori antigen, stool     Status: None   Collection Time: 07/28/21  2:30 PM  Result Value Ref Range   H pylori Ag, Stl Negative Negative     PHQ2/9: Depression screen [facility] 2/9 08/14/2021 07/27/2021 05/31/2021 05/12/2021 12/29/2020  Decreased Interest 0 0 0 0 3  Down, Depressed, Hopeless 0 0 0 0 3  PHQ - 2 Score 0 0 0 0 6  Altered sleeping 0 0 0 0 0  Tired, decreased energy 0 0 0 3 3  Change in appetite 0 0 0 2 3  Feeling bad or failure about yourself  0 0 0 0 2  Trouble concentrating 0 0 0 0 0  Moving slowly or fidgety/restless 0 0 0 0 0  Suicidal thoughts - 0 0 0 0  PHQ-9 Score 0 0 0 5 14  Difficult doing work/chores - - Not difficult at all - -  Some recent data might be hidden    phq 9 is negative   Fall Risk: Fall Risk  08/14/2021 07/27/2021 05/31/2021 05/12/2021 12/29/2020  Falls in the past year? 0 0 0 0 0  Number falls in past yr: 0 - 0 0 0  Injury with Fall? 0 - 0 0 0  Risk for fall due to : No Fall Risks - - - -  Follow up Falls prevention discussed Falls prevention discussed - - -      Assessment & Plan   1. Major depression in remission [facility])  Doing well, continue Effexor   2. Labile essential hypertension  - atenolol-chlorthalidone (TENORETIC) 50-25 MG tablet; Take 0.5 tablets by mouth 2 (two) times daily.  Dispense: 90 tablet; Refill: 0  3. Migraine with aura and without status migrainosus, not intractable  Keep follow up with neurologist   4. Chronic daily headache   5. B12 deficiency   6. Vitamin D deficiency   7. Dyslipidemia   8. Hypertension,  benign  At goal  9. GAD (generalized anxiety disorder)  Improving   10. Exocrine pancreatic insufficiency

## 2021-08-14 ENCOUNTER — Encounter: Payer: Self-pay | Admitting: Family Medicine

## 2021-08-14 ENCOUNTER — Other Ambulatory Visit: Payer: Self-pay

## 2021-08-14 ENCOUNTER — Ambulatory Visit: Payer: Managed Care, Other (non HMO) | Admitting: Family Medicine

## 2021-08-14 VITALS — BP 134/86 | HR 76 | Temp 98.3°F | Resp 16 | Ht 65.0 in | Wt 183.0 lb

## 2021-08-14 DIAGNOSIS — F325 Major depressive disorder, single episode, in full remission: Secondary | ICD-10-CM | POA: Diagnosis not present

## 2021-08-14 DIAGNOSIS — I10 Essential (primary) hypertension: Secondary | ICD-10-CM | POA: Diagnosis not present

## 2021-08-14 DIAGNOSIS — E559 Vitamin D deficiency, unspecified: Secondary | ICD-10-CM

## 2021-08-14 DIAGNOSIS — G43109 Migraine with aura, not intractable, without status migrainosus: Secondary | ICD-10-CM

## 2021-08-14 DIAGNOSIS — E538 Deficiency of other specified B group vitamins: Secondary | ICD-10-CM

## 2021-08-14 DIAGNOSIS — E785 Hyperlipidemia, unspecified: Secondary | ICD-10-CM

## 2021-08-14 DIAGNOSIS — K8681 Exocrine pancreatic insufficiency: Secondary | ICD-10-CM

## 2021-08-14 DIAGNOSIS — F411 Generalized anxiety disorder: Secondary | ICD-10-CM

## 2021-08-14 DIAGNOSIS — R519 Headache, unspecified: Secondary | ICD-10-CM | POA: Diagnosis not present

## 2021-08-14 MED ORDER — ATENOLOL-CHLORTHALIDONE 50-25 MG PO TABS: 0.5000 | ORAL_TABLET | Freq: Two times a day (BID) | ORAL | 0 refills | Status: DC

## 2021-08-16 ENCOUNTER — Ambulatory Visit
Admission: RE | Admit: 2021-08-16 | Discharge: 2021-08-16 | Disposition: A | Discharge status: Home / Self Care | Payer: Managed Care, Other (non HMO) | Source: Ambulatory Visit | Attending: Physical Medicine & Rehabilitation | Admitting: Physical Medicine & Rehabilitation

## 2021-08-16 DIAGNOSIS — M542 Cervicalgia: Secondary | ICD-10-CM | POA: Diagnosis present

## 2021-08-16 IMAGING — MR MR CERVICAL SPINE W/O CM
6 of 7 series · 38 of 48 positions shown · non-contrast
Comparison: Brain MRI [DATE].

CLINICAL DATA: 40-year-old female with persistent pain radiating to
both shoulders and arms. Bilateral hand numbness.

EXAM:
MRI CERVICAL SPINE WITHOUT CONTRAST
TECHNIQUE: Multiplanar, multisequence MR imaging of the cervical spine was
performed. No intravenous contrast was administered.

[Series 5: T2 · sagittal · 3.0mm · 0.62mm/px · 6 of 15 slices shown (1 of 3)]
[im 1/15]
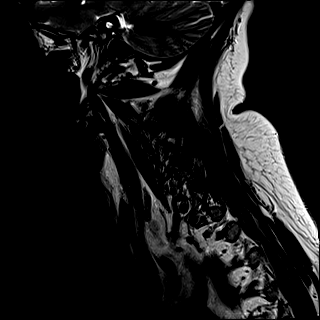
[im 3/15]
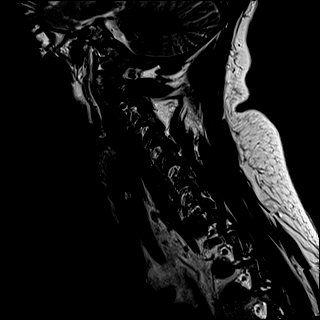
[im 6/15]
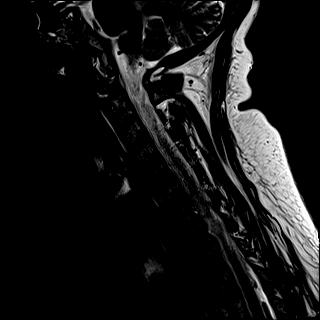
[im 9/15]
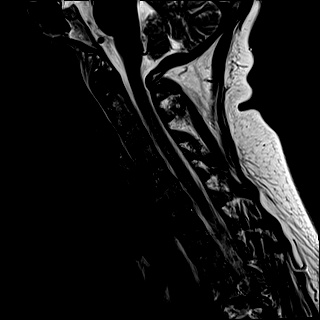
[im 12/15]
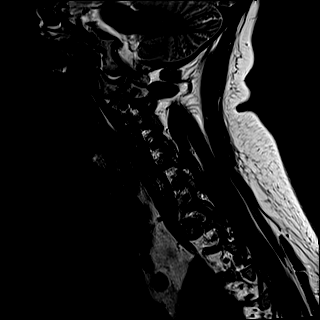
[im 15/15]
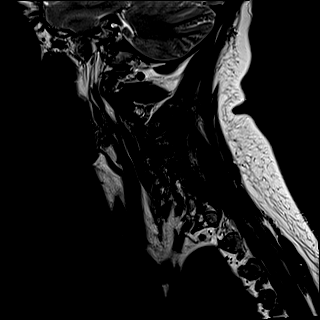

[Series 6: FLAIR · sagittal · 3.0mm · 0.78mm/px · 6 of 15 slices shown]
[im 1/15]
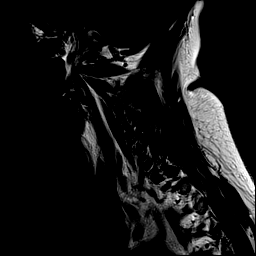
[im 3/15]
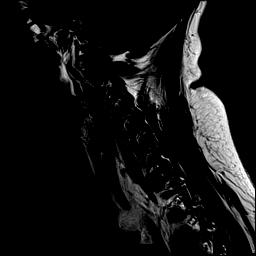
[im 6/15]
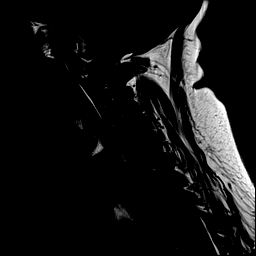
[im 9/15]
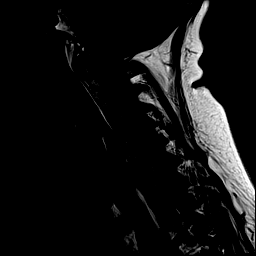
[im 12/15]
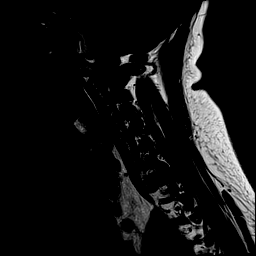
[im 15/15]
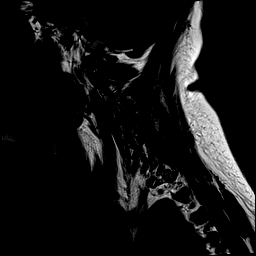

[Series 7: STIR · sagittal · 3.0mm · 0.62mm/px · 6 of 15 slices shown]
[im 1/15]
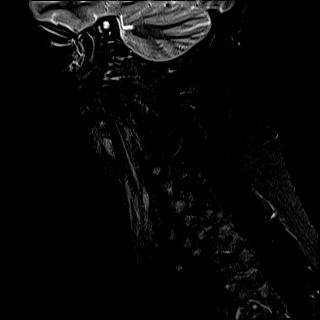
[im 3/15]
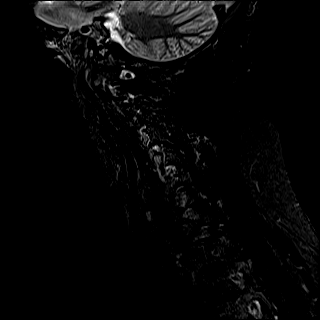
[im 6/15]
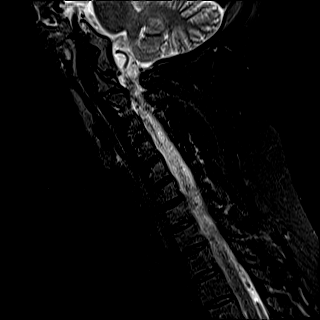
[im 9/15]
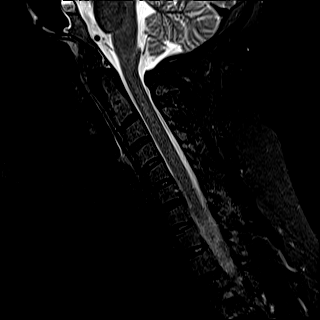
[im 12/15]
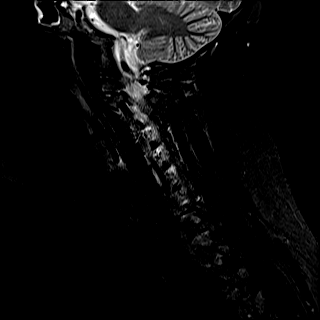
[im 15/15]
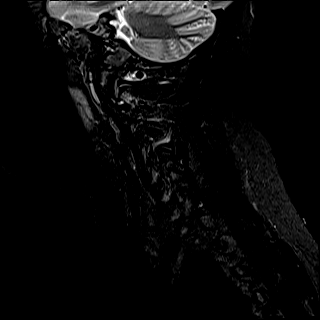

[Series 8: T2 · axial · 3.0mm · 0.70mm/px · z∈[-215,-124]mm · 8 of 29 slices shown (2 of 3)]
[im 1/29]
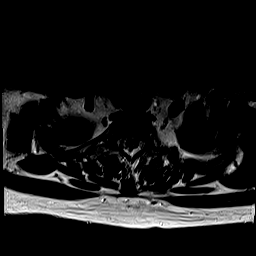
[im 4/29]
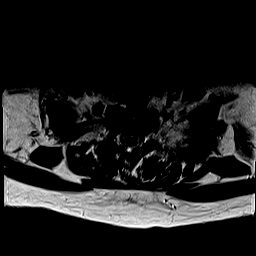
[im 10/29]
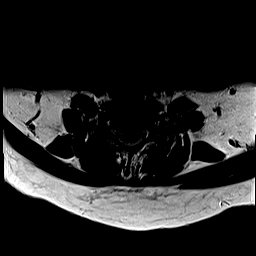
[im 13/29]
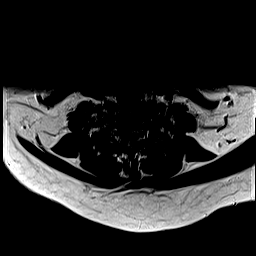
[im 16/29]
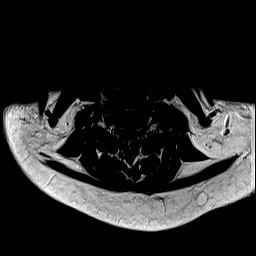
[im 19/29]
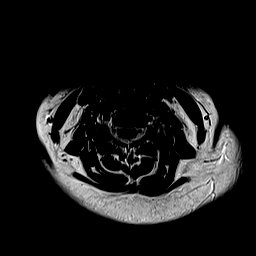
[im 25/29]
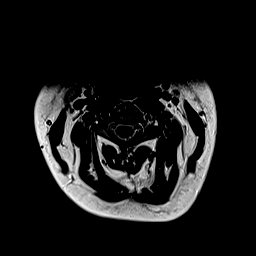
[im 29/29]
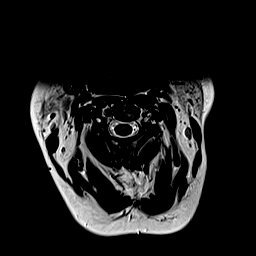

[Series 9: ax mpgr · axial · 3.0mm · 0.35mm/px · z∈[-215,-137]mm · 7 of 29 slices shown]
[im 1/29]
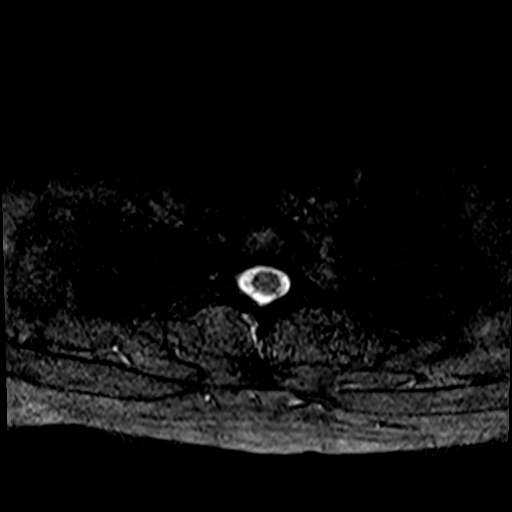
[im 4/29]
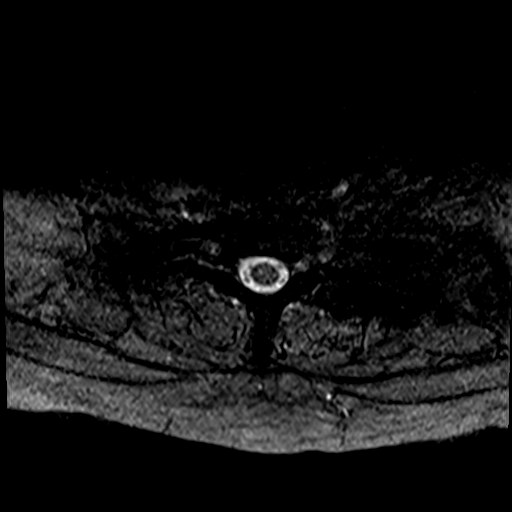
[im 10/29]
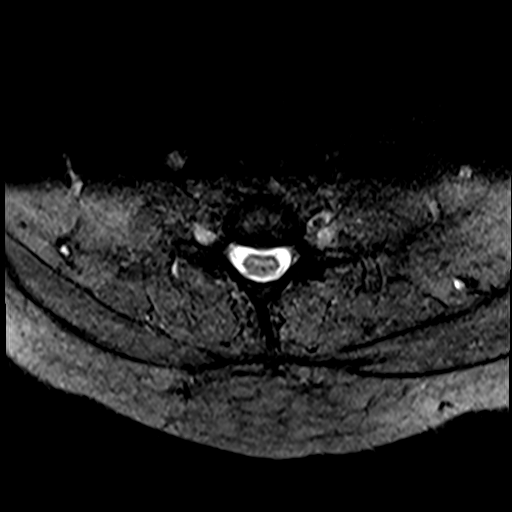
[im 13/29]
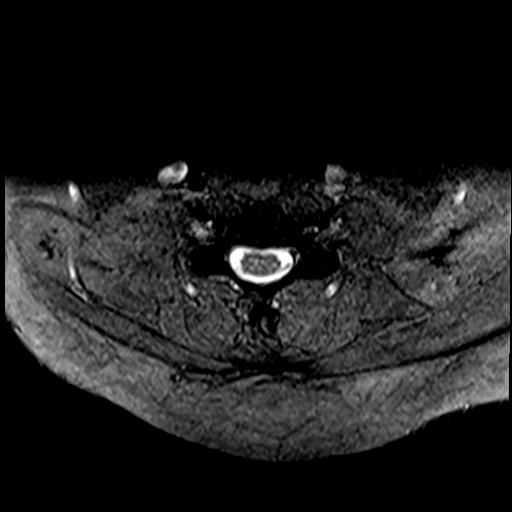
[im 16/29]
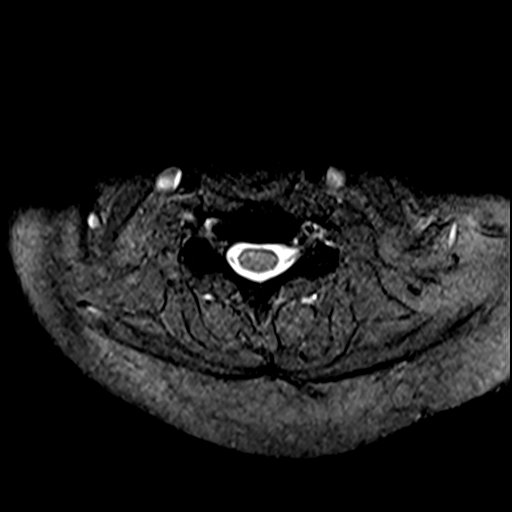
[im 19/29]
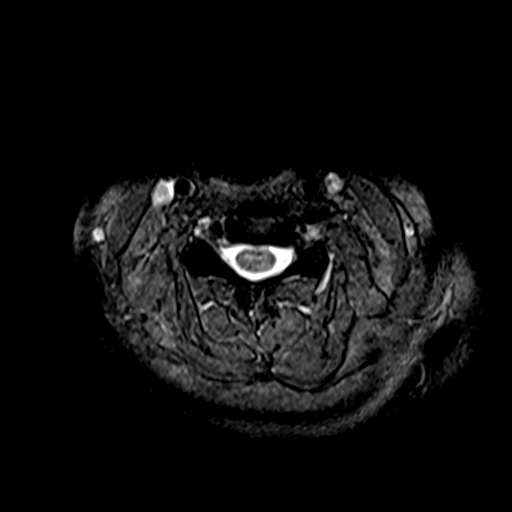
[im 25/29]
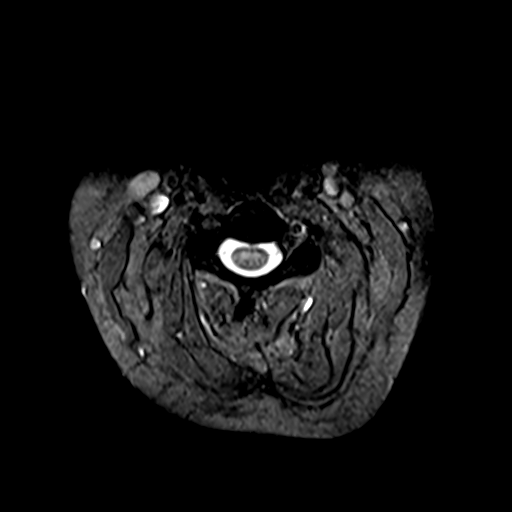

[Series 11: T2 · sagittal · 3.0mm · 0.62mm/px · 5 of 15 slices shown (3 of 3)]
[im 1/15]
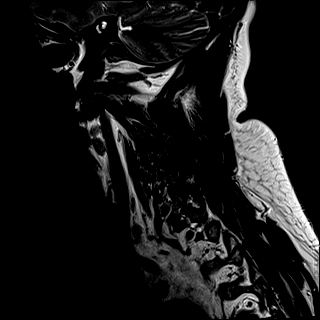
[im 4/15]
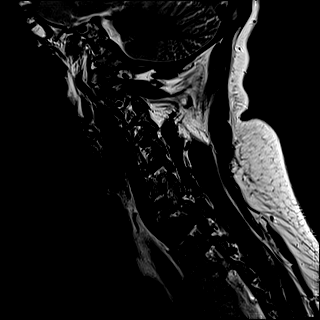
[im 8/15]
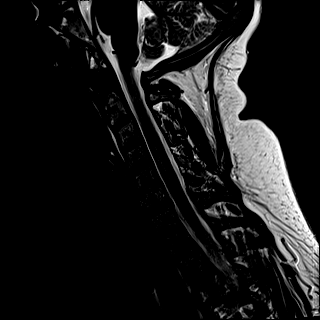
[im 11/15]
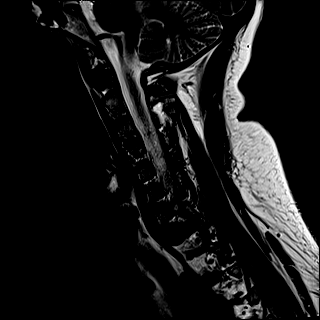
[im 15/15]
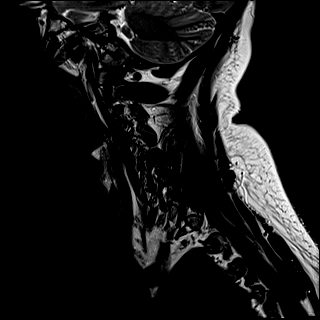

[38 of 48 positions shown; findings below may reference images not displayed]

FINDINGS: Alignment: Straightening of cervical lordosis. No spondylolisthesis.

Vertebrae: No marrow edema or evidence of acute osseous abnormality.
Visualized bone marrow signal is within normal limits.

Cord: Normal.

Posterior Fossa, vertebral arteries, paraspinal tissues:
Cervicomedullary junction is within normal limits. Negative visible
posterior fossa. Preserved major vascular flow voids in the neck.
Dominant right vertebral artery. Negative visible neck soft tissues,
right lung apex.

Disc levels:

C2-C3:  Negative.

C3-C4:  Negative.

C4-C5:  Negative.

C5-C6:  Negative.

C6-C7: Mild disc desiccation, disc bulging. Small broad-based
posterior disc protrusion (series 9, image 20 and series 11, image
9). No stenosis.

C7-T1:  Negative disc.  Mild facet hypertrophy.  No stenosis.

Negative visible upper thoracic levels.
IMPRESSION: Isolated mild disc degeneration at C6-C7 with small broad-based
posterior disc protrusion. But no associated spinal stenosis or
convincing neural impingement.

## 2021-08-22 ENCOUNTER — Other Ambulatory Visit: Payer: Self-pay | Admitting: Physical Medicine & Rehabilitation

## 2021-08-22 DIAGNOSIS — M542 Cervicalgia: Secondary | ICD-10-CM

## 2021-08-22 DIAGNOSIS — M5412 Radiculopathy, cervical region: Secondary | ICD-10-CM

## 2021-08-29 ENCOUNTER — Ambulatory Visit
Admission: RE | Admit: 2021-08-29 | Discharge: 2021-08-29 | Disposition: A | Discharge status: Home / Self Care | Payer: Managed Care, Other (non HMO) | Source: Ambulatory Visit | Attending: Physical Medicine & Rehabilitation | Admitting: Physical Medicine & Rehabilitation

## 2021-08-29 ENCOUNTER — Other Ambulatory Visit: Payer: Self-pay

## 2021-08-29 DIAGNOSIS — M542 Cervicalgia: Secondary | ICD-10-CM

## 2021-08-29 DIAGNOSIS — M5412 Radiculopathy, cervical region: Secondary | ICD-10-CM

## 2021-08-29 IMAGING — XA DG INJECT/[PERSON_NAME] INC NEEDLE/CATH/PLC EPI/CERV/THOR W/IMG
2 series · 2 of 2 positions shown · non-contrast
Comparison: none

CLINICAL DATA: Cervicalgia.  Bilateral neck and shoulder pain.

[Series 1: ortho standard · 1 of 1 slices shown (1 of 2)]
[im 1/1]
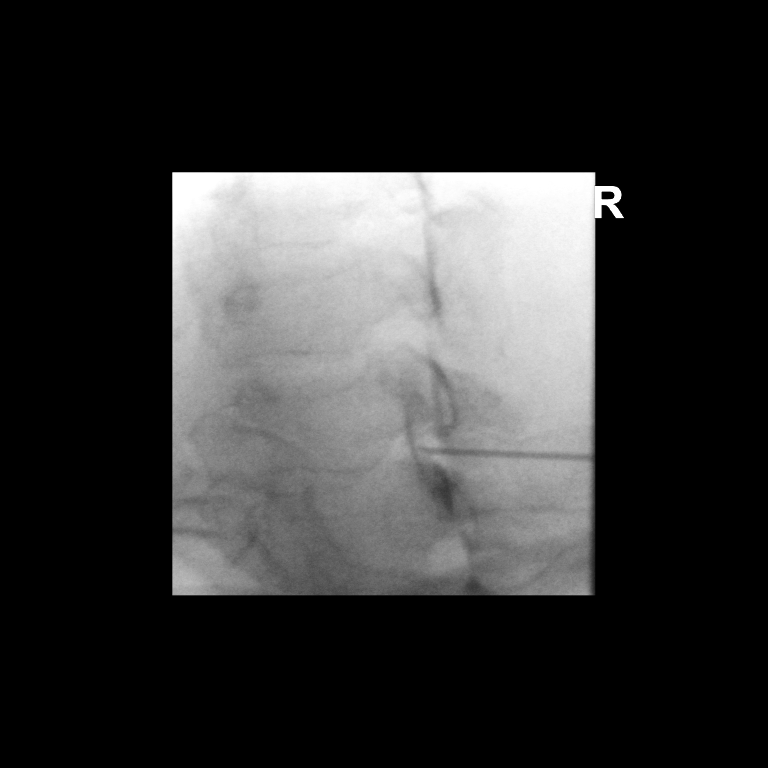

[Series 2: ortho standard · 1 of 1 slices shown (2 of 2)]
[im 1/1]
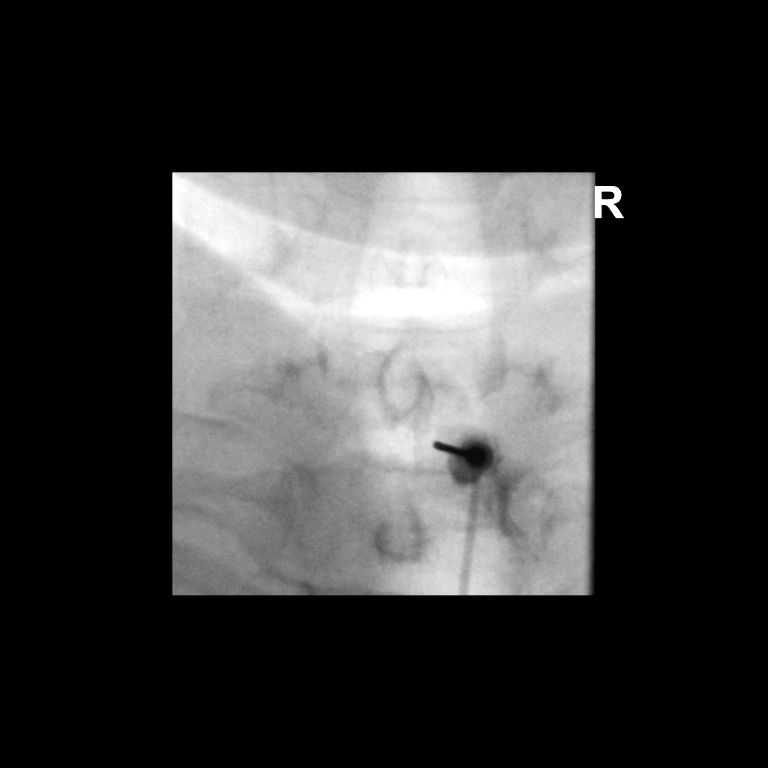

[2 of 2 positions shown; findings below may reference images not displayed]

FLUOROSCOPY TIME:  0 minutes 33 seconds. 13.21 micro gray meter
squared

PROCEDURE:
CERVICAL EPIDURAL INJECTION

An interlaminar approach was performed on the right at C7-T1. A 20
gauge epidural needle was advanced using loss-of-resistance
technique.

DIAGNOSTIC EPIDURAL INJECTION

Injection of Isovue-M 300 shows a good epidural pattern with spread
above and below the level of needle placement, extending to both
sides. No vascular opacification is seen. THERAPEUTIC

EPIDURAL INJECTION

1.5 ml of Kenalog 40 mixed with 1 ml of 1% Lidocaine and 2 ml of
normal saline were then instilled. The procedure was well-tolerated,
and the patient was discharged thirty minutes following the
injection in good condition.
IMPRESSION: Technically successful initial epidural injection on the right at
C7-T1.

## 2021-08-29 MED ORDER — TRIAMCINOLONE ACETONIDE 40 MG/ML IJ SUSP (RADIOLOGY)
60.0000 mg | Freq: Once | INTRAMUSCULAR | Status: AC
  Administered 2021-08-29: 60 mg via EPIDURAL

## 2021-08-29 MED ORDER — DIPHENHYDRAMINE HCL 50 MG PO CAPS
50.0000 mg | ORAL_CAPSULE | Freq: Once | ORAL | Status: AC
Start: 2021-08-29 — End: 2021-08-29
  Administered 2021-08-29: 50 mg via ORAL

## 2021-08-29 MED ORDER — IOPAMIDOL (ISOVUE-M 300) INJECTION 61%
1.0000 mL | Freq: Once | INTRAMUSCULAR | Status: AC | PRN
  Administered 2021-08-29: 1 mL via EPIDURAL

## 2021-08-29 NOTE — Discharge Instructions (Signed)

## 2021-08-31 ENCOUNTER — Other Ambulatory Visit: Payer: Self-pay | Admitting: Family Medicine

## 2021-09-27 ENCOUNTER — Ambulatory Visit: Payer: Managed Care, Other (non HMO) | Admitting: Physician Assistant

## 2021-09-27 ENCOUNTER — Other Ambulatory Visit: Payer: Self-pay

## 2021-09-27 ENCOUNTER — Other Ambulatory Visit: Payer: Self-pay | Admitting: Physician Assistant

## 2021-09-27 VITALS — BP 126/82 | HR 95 | Temp 97.6°F | Resp 16 | Ht 65.0 in | Wt 184.0 lb

## 2021-09-27 DIAGNOSIS — J069 Acute upper respiratory infection, unspecified: Secondary | ICD-10-CM | POA: Diagnosis not present

## 2021-09-27 LAB — POCT INFLUENZA A/B
Influenza A, POC: NEGATIVE
Influenza B, POC: NEGATIVE

## 2021-09-27 NOTE — Patient Instructions (Signed)
  Based on your described symptoms and the duration of symptoms it is likely that you have a viral upper respiratory infection (often called a "cold")  Symptoms can last for 3-10 days with lingering cough and intermittent symptoms lasting weeks after that.  The goal of treatment at this time is to reduce your symptoms and discomfort    You can use over the counter medications such as Dayquil/Nyquil, AlkaSeltzer formulations, etc to provide further relief of symptoms according to the manufacturer's instructions  If preferred you can use Coricidin to manage your symptoms rather than those medications mentioned above.    If your symptoms do not improve or become worse in the next 5-7 days please make an apt at the office so we can see you  Go to the ER if you begin to have more serious symptoms such as shortness of breath, trouble breathing, loss of consciousness, swelling around the eyes, high fever, severe lasting headaches, vision changes or neck pain/stiffness.   

## 2021-09-27 NOTE — Progress Notes (Signed)
Acute Office Visit  Subjective:    Patient ID: Hannah Gregory, female    DOB: May 22, 1981, 40 y.o.   MRN: [id number]   Introduced myself to the patient as a PA-C and provided education on APPs in clinical practice.   Chief Complaint  Patient presents with   Cough   Sore Throat   Ear Pain    B/L    Cough Associated symptoms include chills, ear pain, headaches, postnasal drip, rhinorrhea and a sore throat. Pertinent negatives include no fever, myalgias or shortness of breath.  Sore Throat  Associated symptoms include congestion, coughing, ear pain and headaches. Pertinent negatives include no diarrhea, ear discharge, neck pain, shortness of breath, trouble swallowing or vomiting.  Patient is in today for productive cough, sore throat, sinus congestion, bilateral ear pain for 2 days She has been taking Tylenol and Ibuprofen for pain  Denies recent sick contacts.  Has not performed a home COVID test for symtoms Has received flu shot and 4 COVID shots - has not received bivalent   Past Medical History:  Diagnosis Date   Family history of breast cancer    10/20 genetic testing letter sent   Migraine    Scoliosis     No past surgical history on file.  Family History  Problem Relation Age of Onset   Asthma Mother    Hypertension Mother    Hyperlipidemia Mother    Thyroid disease Mother    Heart attack Father    Migraines Father    Hypertension Father    Hyperlipidemia Father    Mental retardation Brother    COPD Brother    Hypertension Brother    Emphysema Brother    Breast cancer Maternal Grandmother    Breast cancer Maternal Aunt    Breast cancer Maternal Aunt     Social History   Socioeconomic History   Marital status: Single    Spouse name: Not on file   Number of children: 3   Years of education: Not on file   Highest education level: High school graduate  Occupational History   Occupation: Pharmacy Tech     Comment: Software engineer   Tobacco Use   Smoking status: Every Day    Packs/day: 0.25    Years: 22.00    Pack years: 5.50    Types: Cigarettes    Start date: 10/01/1997   Smokeless tobacco: Never  Vaping Use   Vaping Use: Never used  Substance and Sexual Activity   Alcohol use: Yes    Comment: occasionally   Drug use: No   Sexual activity: Yes    Partners: Male    Birth control/protection: Condom, Injection  Other Topics Concern   Not on file  Social History Narrative   Single mother of 3 children, lives across from her parents   Social Determinants of Health   Financial Resource Strain: Not on file  Food Insecurity: Not on file  Transportation Needs: Not on file  Physical Activity: Not on file  Stress: Not on file  Social Connections: Not on file  Intimate Partner Violence: Not on file    Outpatient Medications Prior to Visit  Medication Sig Dispense Refill   ascorbic acid (VITAMIN C) 500 MG tablet Take 500 mg by mouth daily.     atenolol-chlorthalidone (TENORETIC) 50-25 MG tablet Take 0.5 tablets by mouth 2 (two) times daily. 90 tablet 0   busPIRone (BUSPAR) 5 MG tablet Take 1 tablet (5 mg total) by mouth 2 (  two) times daily as needed. 60 tablet 0   cholecalciferol (VITAMIN D3) 25 MCG (1000 UNIT) tablet Take 2,000 Units by mouth daily.     cyanocobalamin (,VITAMIN B-12,) 1000 MCG/ML injection INJECT 1ML=1000MCG INTRAMUSCULARLY EVERY MONTH 1 mL 11   Fremanezumab-vfrm (AJOVY) 225 MG/1.5ML SOAJ Inject into the skin.     hydrALAZINE (APRESOLINE) 25 MG tablet Take 1 tablet (25 mg total) by mouth 3 (three) times daily as needed (BP above 150/90). 90 tablet 0   lipase/protease/amylase (CREON) 36000 UNITS CPEP capsule Take 1 capsule (36,000 Units total) by mouth 3 (three) times daily with meals. May also take 1 capsule (36,000 Units total) as needed (with snacks). 240 capsule 3   Ubrogepant (UBRELVY) 100 MG TABS Take 1 tablet by mouth daily as needed. May take 2 hours later if no improvement 10 tablet 0    valACYclovir (VALTREX) 1000 MG tablet Take 2 tablets every 12 hours for 2 doses total, then stop taking.  Can take in the future if symptoms of cold sores just beginning to help blunt the response (Patient taking differently: as needed (cold sores). Take 2 tablets every 12 hours for 2 doses total, then stop taking.  Can take in the future if symptoms of cold sores just beginning to help blunt the response) 12 tablet 1   venlafaxine XR (EFFEXOR-XR) 150 MG 24 hr capsule Take 150 mg by mouth daily with breakfast.     vitamin B-12 (CYANOCOBALAMIN) 1000 MCG tablet Take 1,000 mcg by mouth daily.     No facility-administered medications prior to visit.    Allergies  Allergen Reactions   Imitrex [Sumatriptan] Other (See Comments)    Side effect , chest pain, flushed    Chantix [Varenicline Tartrate]     nausea   Prednisone Hives and Other (See Comments)    Numbness of her face    Abilify [Aripiprazole] Nausea Only    Review of Systems  Constitutional:  Positive for chills and fatigue. Negative for fever.  HENT:  Positive for congestion, ear pain, postnasal drip, rhinorrhea, sinus pressure, sinus pain and sore throat. Negative for ear discharge and trouble swallowing.   Respiratory:  Positive for cough. Negative for shortness of breath.   Gastrointestinal:  Negative for diarrhea, nausea and vomiting.  Musculoskeletal:  Negative for arthralgias, myalgias, neck pain and neck stiffness.  Neurological:  Positive for headaches. Negative for dizziness and light-headedness.      Objective:    Physical Exam HENT:     Right Ear: Hearing, tympanic membrane and external ear normal.     Left Ear: Hearing, tympanic membrane, ear canal and external ear normal.     Ears:     Comments: Right canal is mildly erythematous     Nose:     Left Turbinates: Enlarged and swollen.     Right Sinus: Maxillary sinus tenderness and frontal sinus tenderness present.     Left Sinus: Maxillary sinus tenderness and  frontal sinus tenderness present.     Mouth/Throat:     Mouth: Mucous membranes are moist.     Pharynx: Uvula midline. Posterior oropharyngeal erythema present. No pharyngeal swelling or oropharyngeal exudate.     Tonsils: No tonsillar exudate or tonsillar abscesses.  Cardiovascular:     Rate and Rhythm: Normal rate.     Pulses: Normal pulses.     Heart sounds: Normal heart sounds, S1 normal and S2 normal.  Pulmonary:     Effort: Pulmonary effort is normal.     Breath  sounds: Normal breath sounds and air entry. No decreased breath sounds, wheezing, rhonchi or rales.  Musculoskeletal:     Right lower leg: No edema.     Left lower leg: No edema.    BP 126/82    Pulse 95    Temp 97.6 F (36.4 C)    Resp 16    Ht 5\' 5"  (1.651 m)    Wt 184 lb (83.5 kg)    SpO2 98%    BMI 30.62 kg/m  Wt Readings from Last 3 Encounters:  09/27/21 184 lb (83.5 kg)  08/14/21 183 lb (83 kg)  07/28/21 185 lb 9.6 oz (84.2 kg)    There are no preventive care reminders to display for this patient.   There are no preventive care reminders to display for this patient.   Lab Results  Component Value Date   TSH 1.34 05/03/2020   Lab Results  Component Value Date   WBC 9.2 12/29/2020   HGB 13.7 12/29/2020   HCT 40.0 12/29/2020   MCV 98.0 12/29/2020   PLT 292 12/29/2020   Lab Results  Component Value Date   NA 137 12/29/2020   K 4.1 12/29/2020   CO2 26 12/29/2020   GLUCOSE 89 12/29/2020   BUN 22 12/29/2020   CREATININE 0.97 12/29/2020   BILITOT 0.8 12/29/2020   ALKPHOS 143 (H) 09/10/2020   AST 16 12/29/2020   ALT 11 12/29/2020   PROT 6.9 12/29/2020   ALBUMIN 3.5 09/10/2020   CALCIUM 9.4 12/29/2020   ANIONGAP 9 09/10/2020   Lab Results  Component Value Date   CHOL 185 12/29/2020   Lab Results  Component Value Date   HDL 49 (L) 12/29/2020   Lab Results  Component Value Date   LDLCALC 93 12/29/2020   Lab Results  Component Value Date   TRIG 323 (H) 12/29/2020   Lab Results   Component Value Date   CHOLHDL 3.8 12/29/2020   Lab Results  Component Value Date   HGBA1C 5.1 12/29/2020       Assessment & Plan:   Problem List Items Addressed This Visit   None Visit Diagnoses     Upper respiratory tract infection, unspecified type    -  Primary   Relevant Orders   POCT Influenza A/B (Completed)   Novel Coronavirus, NAA ([facility])      1. Upper respiratory tract infection, unspecified type Visit with patient indicates symptoms of cough, congestion, sinus pain, pressure, sore throat for 2 days congruent with acute URI that is likely viral in nature but possibility of  COVID, and bacterial URI cannot be completely ruled out at this time.  Rapid Influenza testing was negative for Influenza A and B.  COVID testing completed during visit- awaiting results.  Due to nature and duration of symptoms recommended treatment regimen is symptomatic relief and follow up if needed Discussed with patient the various viral and bacterial etiologies of current illness and appropriate course of treatment Discussed OTC medication options for multisymptom relief such as Dayquil/Nyquil, Theraflu, AlkaSeltzer, etc. Discussed return precautions if symptoms are not improving or worsen over next 5-7 days.     No orders of the defined types were placed in this encounter.    Nikoletta Varma E Klein Willcox, PA-C

## 2021-09-28 LAB — SPECIMEN STATUS REPORT

## 2021-09-28 LAB — NOVEL CORONAVIRUS, NAA: SARS-CoV-2, NAA: NOT DETECTED

## 2021-09-28 LAB — SARS-COV-2, NAA 2 DAY TAT

## 2021-10-04 ENCOUNTER — Telehealth: Payer: Self-pay | Admitting: Family Medicine

## 2021-10-04 NOTE — Telephone Encounter (Signed)
Copied from CRM [id number]. Topic: General - Other >> Oct 04, 2021 10:00 AM Jaquita Rector A wrote: Reason for CRM: Patient called in to ask nurse if there are any samples of Fremanezumab-vfrm (AJOVY) 225 MG/1.5ML SOAJ available for her to pick up would like a call back with an answer at Ph# [phone]

## 2021-10-04 NOTE — Telephone Encounter (Signed)
Called pt back to inform her we don't have nay in the office in the moment.

## 2021-10-18 ENCOUNTER — Other Ambulatory Visit: Payer: Self-pay | Admitting: Physical Medicine & Rehabilitation

## 2021-10-18 DIAGNOSIS — M542 Cervicalgia: Secondary | ICD-10-CM

## 2021-10-20 ENCOUNTER — Ambulatory Visit
Admission: RE | Admit: 2021-10-20 | Discharge: 2021-10-20 | Disposition: A | Discharge status: Home / Self Care | Payer: Managed Care, Other (non HMO) | Source: Ambulatory Visit | Attending: Physical Medicine & Rehabilitation | Admitting: Physical Medicine & Rehabilitation

## 2021-10-20 ENCOUNTER — Other Ambulatory Visit: Payer: Self-pay

## 2021-10-20 DIAGNOSIS — M542 Cervicalgia: Secondary | ICD-10-CM

## 2021-10-20 IMAGING — XA DG INJECT/[PERSON_NAME] INC NEEDLE/CATH/PLC EPI/CERV/THOR W/IMG
2 series · 2 of 2 positions shown · non-contrast
Comparison: none

CLINICAL DATA: Cervical spondylosis without myelopathy. Bilateral
neck and shoulder pain. The patient reports substantial relief for a
few weeks following the initial epidural injection in [DATE].

[Series 1: ortho standard · 1 of 1 slices shown (1 of 2)]
[im 1/1]
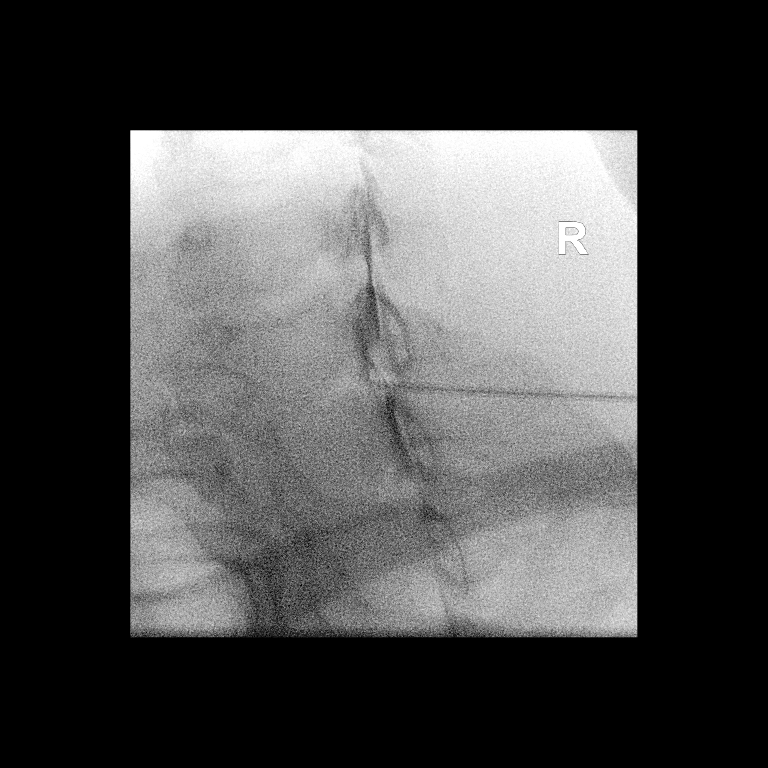

[Series 2: ortho standard · 1 of 1 slices shown (2 of 2)]
[im 1/1]
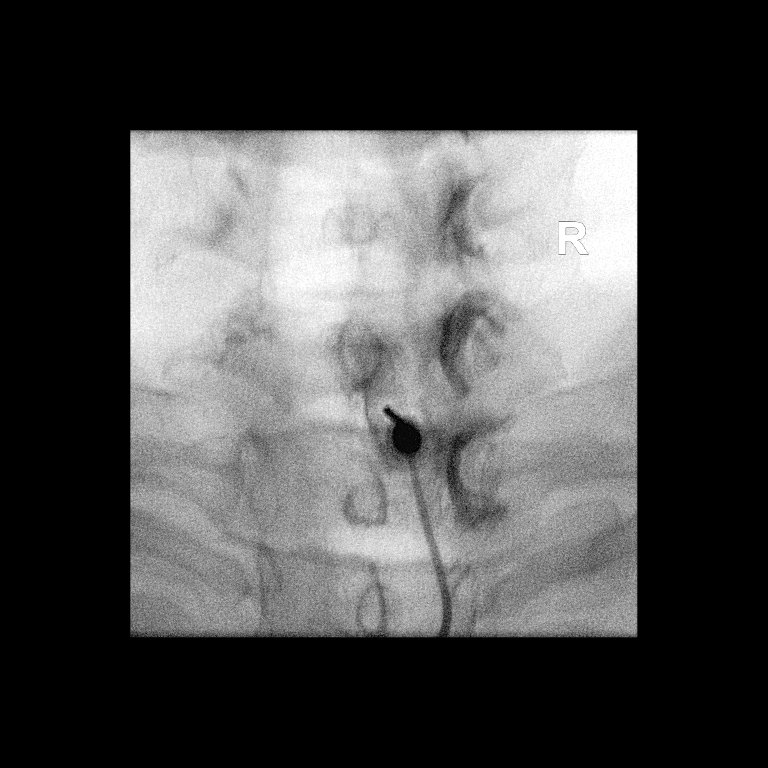

[2 of 2 positions shown; findings below may reference images not displayed]

FLUOROSCOPY TIME:  Fluoroscopy Time: 14 seconds

Radiation Exposure Index: 7.68 microGray*m^2

PROCEDURE:
The procedure, risks, benefits, and alternatives were explained to
the patient. Questions regarding the procedure were encouraged and
answered. The patient understands and consents to the procedure.

CERVICAL EPIDURAL INJECTION

An interlaminar approach was performed on the right at C7-T1. A
inch 20 gauge epidural needle was advanced using loss-of-resistance
technique.

DIAGNOSTIC EPIDURAL INJECTION

Injection of Isovue-M 300 shows a good epidural pattern with spread
above and below the level of needle placement, bilaterally but
greater on the right. No vascular opacification is seen.

THERAPEUTIC EPIDURAL INJECTION

1.5 ml of Kenalog 40 mixed with 2 ml of normal saline were then
instilled. The procedure was well-tolerated, and the patient was
discharged thirty minutes following the injection in good condition.
IMPRESSION: Technically successful interlaminar epidural injection on the right
at C7-T1.

## 2021-10-20 MED ORDER — IOPAMIDOL (ISOVUE-M 300) INJECTION 61%
1.0000 mL | Freq: Once | INTRAMUSCULAR | Status: AC
  Administered 2021-10-20: 1 mL via EPIDURAL

## 2021-10-20 MED ORDER — TRIAMCINOLONE ACETONIDE 40 MG/ML IJ SUSP (RADIOLOGY)
60.0000 mg | Freq: Once | INTRAMUSCULAR | Status: AC
  Administered 2021-10-20: 60 mg via EPIDURAL

## 2021-10-20 NOTE — Discharge Instructions (Signed)

## 2021-11-23 ENCOUNTER — Ambulatory Visit: Payer: Managed Care, Other (non HMO) | Admitting: Gastroenterology

## 2021-11-23 ENCOUNTER — Other Ambulatory Visit: Payer: Self-pay

## 2021-11-29 ENCOUNTER — Other Ambulatory Visit: Payer: Self-pay | Admitting: Physical Medicine & Rehabilitation

## 2021-11-29 DIAGNOSIS — M542 Cervicalgia: Secondary | ICD-10-CM

## 2021-12-03 ENCOUNTER — Ambulatory Visit
Admission: RE | Admit: 2021-12-03 | Discharge: 2021-12-03 | Disposition: A | Discharge status: Home / Self Care | Payer: Managed Care, Other (non HMO) | Source: Ambulatory Visit | Attending: Physical Medicine & Rehabilitation | Admitting: Physical Medicine & Rehabilitation

## 2021-12-03 ENCOUNTER — Other Ambulatory Visit: Payer: Self-pay

## 2021-12-03 DIAGNOSIS — M542 Cervicalgia: Secondary | ICD-10-CM | POA: Insufficient documentation

## 2021-12-03 IMAGING — MR MR CERVICAL SPINE W/O CM
5 series · 39 of 48 positions shown · non-contrast
Comparison: [DATE]

CLINICAL DATA: Chronic neck pain for years, progressively worsening
over the last 2 months. No known injury.

EXAM:
MRI CERVICAL SPINE WITHOUT CONTRAST
TECHNIQUE: Multiplanar, multisequence MR imaging of the cervical spine was
performed. No intravenous contrast was administered.

[Series 5: T2 · sagittal · 3.0mm · 0.62mm/px · 8 of 15 slices shown (1 of 2)]
[im 1/15]
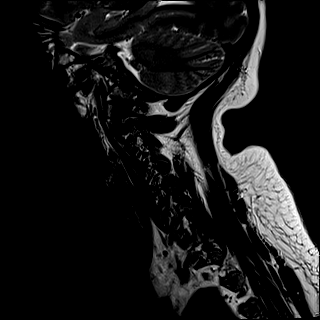
[im 3/15]
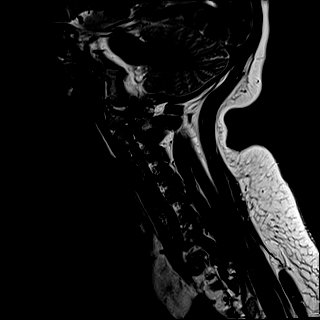
[im 5/15]
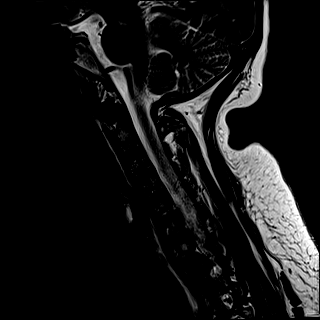
[im 7/15]
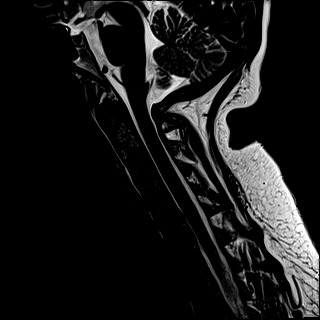
[im 9/15]
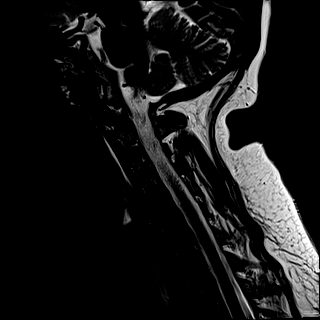
[im 11/15]
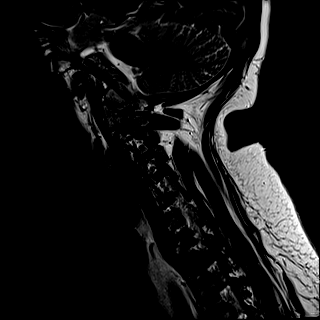
[im 13/15]
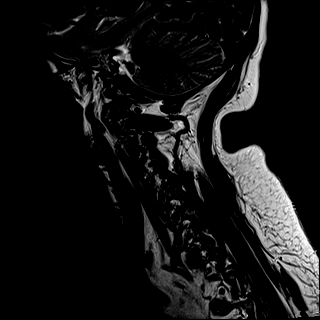
[im 15/15]
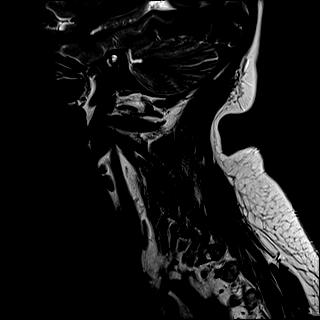

[Series 6: FLAIR · sagittal · 3.0mm · 0.78mm/px · 7 of 15 slices shown]
[im 1/15]
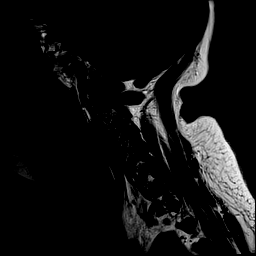
[im 3/15]
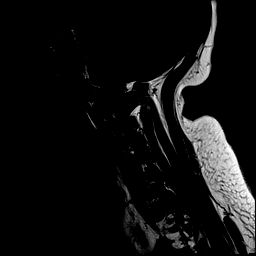
[im 5/15]
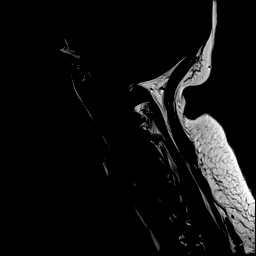
[im 8/15]
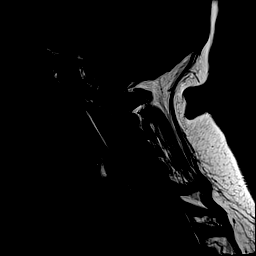
[im 10/15]
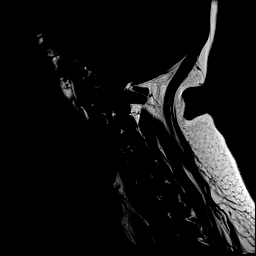
[im 12/15]
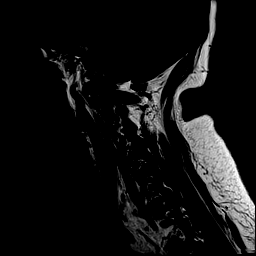
[im 15/15]
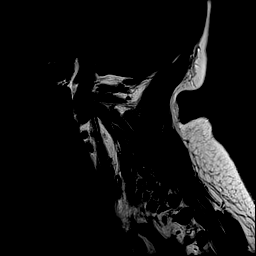

[Series 7: STIR · sagittal · 3.0mm · 0.62mm/px · 7 of 15 slices shown]
[im 1/15]
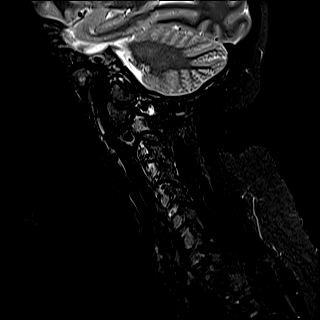
[im 3/15]
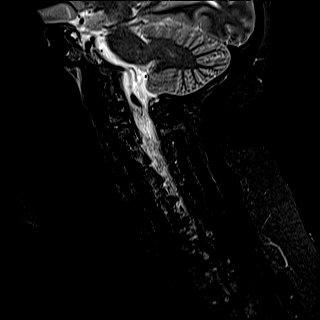
[im 5/15]
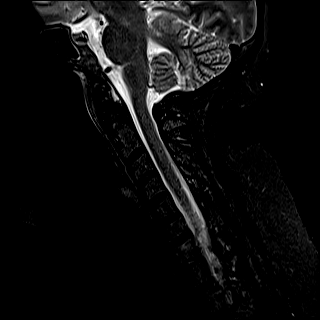
[im 8/15]
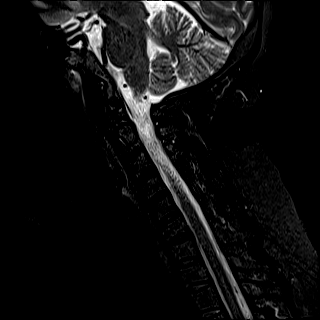
[im 10/15]
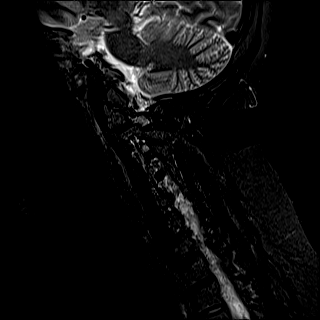
[im 12/15]
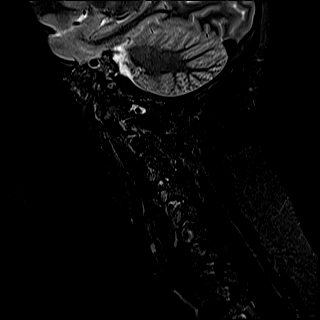
[im 15/15]
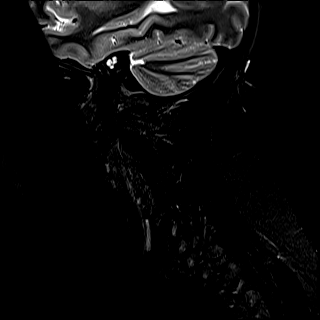

[Series 8: T2 · axial · 3.0mm · 0.70mm/px · z∈[-230,-149]mm · 9 of 27 slices shown (2 of 2)]
[im 1/27]
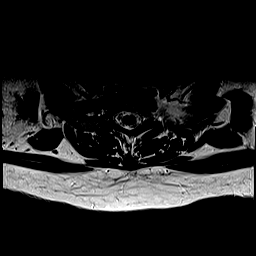
[im 5/27]
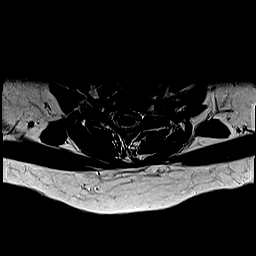
[im 9/27]
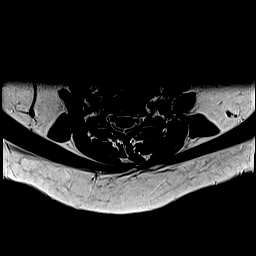
[im 11/27]
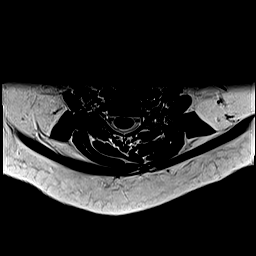
[im 14/27]
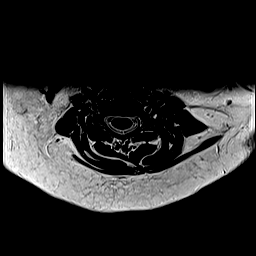
[im 16/27]
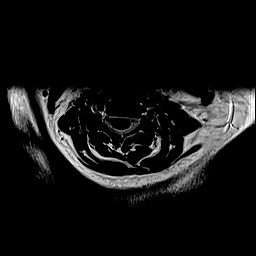
[im 18/27]
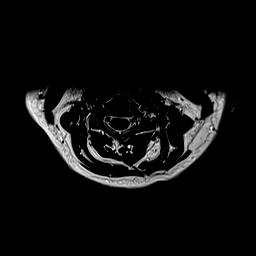
[im 22/27]
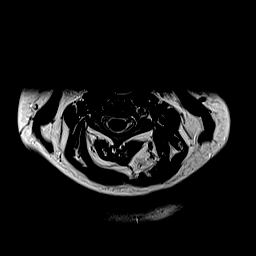
[im 27/27]
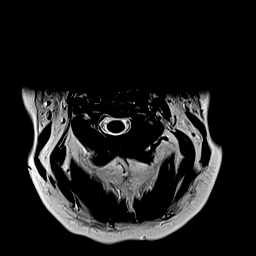

[Series 9: ax mpgr · axial · 3.0mm · 0.35mm/px · z∈[-230,-149]mm · 8 of 27 slices shown]
[im 1/27]
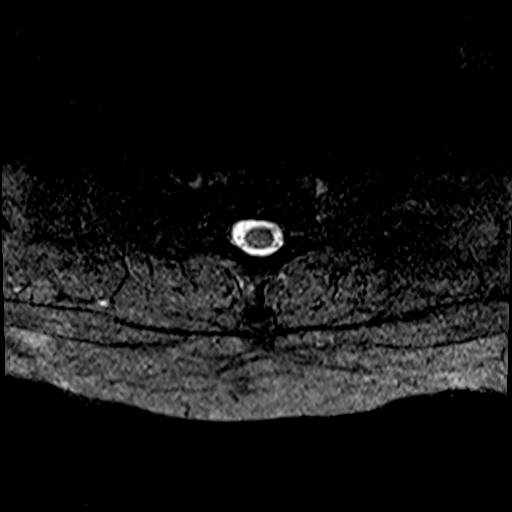
[im 5/27]
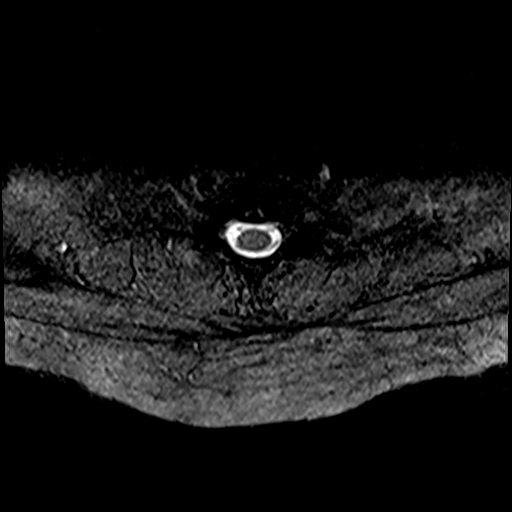
[im 9/27]
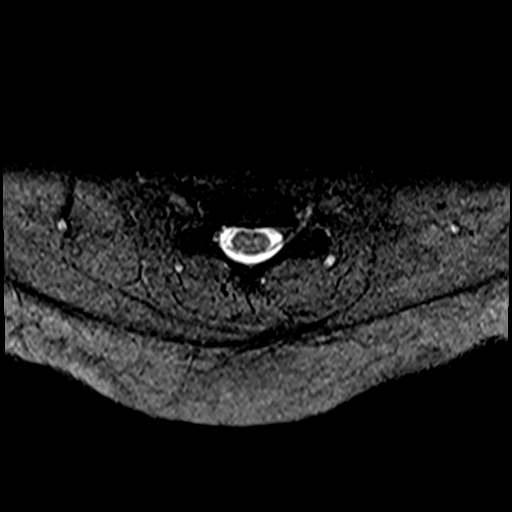
[im 11/27]
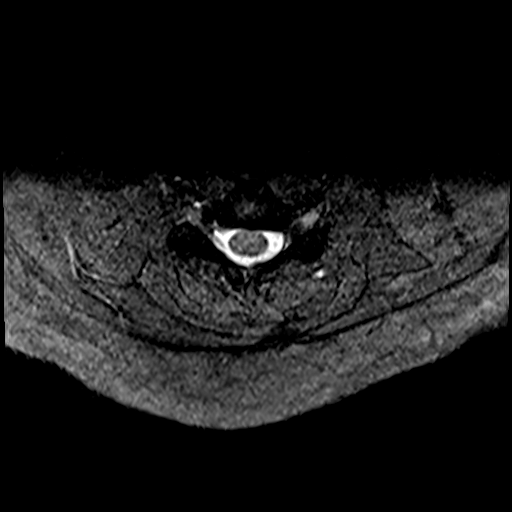
[im 16/27]
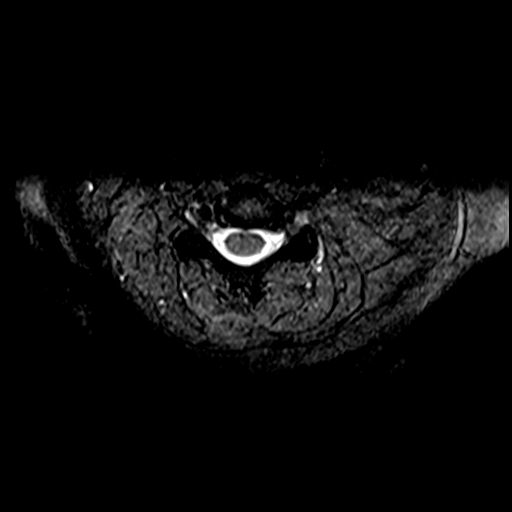
[im 18/27]
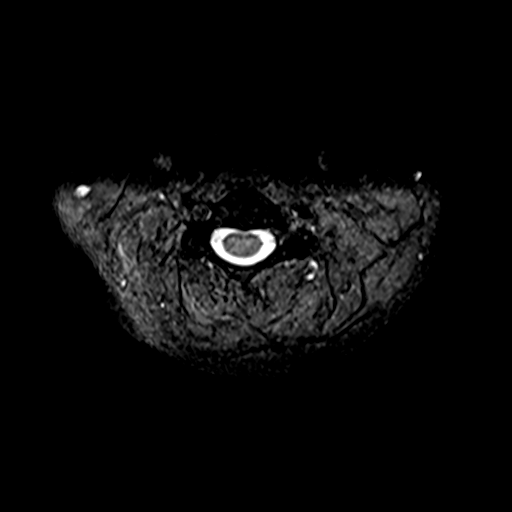
[im 22/27]
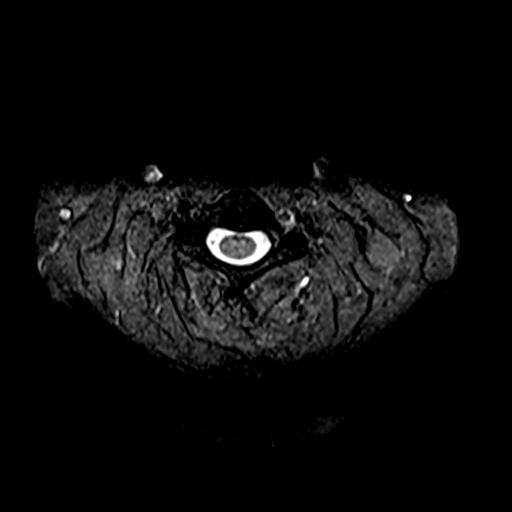
[im 27/27]
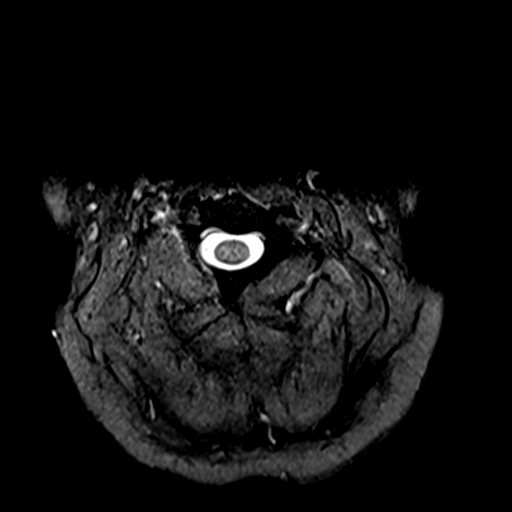

[39 of 48 positions shown; findings below may reference images not displayed]

FINDINGS: Alignment: Physiologic.

Vertebrae: No acute fracture, evidence of discitis, or bone lesion.

Cord: Normal signal and morphology.

Posterior Fossa, vertebral arteries, paraspinal tissues: Posterior
fossa demonstrates no focal abnormality. Vertebral artery flow voids
are maintained. Paraspinal soft tissues are unremarkable.

Disc levels:

Discs: Mild disc desiccation at C6-7.

C2-3: No significant disc bulge. No neural foraminal stenosis. No
central canal stenosis.

C3-4: No significant disc bulge. No neural foraminal stenosis. No
central canal stenosis.

C4-5: No significant disc bulge. No neural foraminal stenosis. No
central canal stenosis.

C5-6: No significant disc bulge. No neural foraminal stenosis. No
central canal stenosis.

C6-7: Small left foraminal disc protrusion. No foraminal or central
canal stenosis.

C7-T1: No significant disc bulge. No neural foraminal stenosis. No
central canal stenosis. Mild bilateral facet arthropathy.
IMPRESSION: 1. At C6-7 there is a small left foraminal disc protrusion. No
foraminal or central canal stenosis.

## 2021-12-05 ENCOUNTER — Encounter: Payer: Self-pay | Admitting: Family Medicine

## 2021-12-05 ENCOUNTER — Ambulatory Visit (INDEPENDENT_AMBULATORY_CARE_PROVIDER_SITE_OTHER): Payer: Managed Care, Other (non HMO) | Admitting: Family Medicine

## 2021-12-05 ENCOUNTER — Other Ambulatory Visit: Payer: Self-pay

## 2021-12-05 VITALS — BP 160/100 | HR 112 | Temp 97.6°F | Resp 16 | Ht 65.0 in | Wt 184.5 lb

## 2021-12-05 DIAGNOSIS — E538 Deficiency of other specified B group vitamins: Secondary | ICD-10-CM

## 2021-12-05 DIAGNOSIS — I10 Essential (primary) hypertension: Secondary | ICD-10-CM | POA: Diagnosis not present

## 2021-12-05 DIAGNOSIS — R519 Headache, unspecified: Secondary | ICD-10-CM

## 2021-12-05 DIAGNOSIS — Z8659 Personal history of other mental and behavioral disorders: Secondary | ICD-10-CM

## 2021-12-05 DIAGNOSIS — Z23 Encounter for immunization: Secondary | ICD-10-CM | POA: Diagnosis not present

## 2021-12-05 DIAGNOSIS — G43009 Migraine without aura, not intractable, without status migrainosus: Secondary | ICD-10-CM

## 2021-12-05 DIAGNOSIS — F17200 Nicotine dependence, unspecified, uncomplicated: Secondary | ICD-10-CM | POA: Insufficient documentation

## 2021-12-05 DIAGNOSIS — F32A Depression, unspecified: Secondary | ICD-10-CM

## 2021-12-05 DIAGNOSIS — Z6830 Body mass index (BMI) 30.0-30.9, adult: Secondary | ICD-10-CM

## 2021-12-05 DIAGNOSIS — E669 Obesity, unspecified: Secondary | ICD-10-CM

## 2021-12-05 DIAGNOSIS — M542 Cervicalgia: Secondary | ICD-10-CM

## 2021-12-05 DIAGNOSIS — Z0000 Encounter for general adult medical examination without abnormal findings: Secondary | ICD-10-CM | POA: Diagnosis not present

## 2021-12-05 MED ORDER — ATENOLOL-CHLORTHALIDONE 100-25 MG PO TABS: 1.0000 | ORAL_TABLET | Freq: Every day | ORAL | 0 refills | Status: DC

## 2021-12-05 NOTE — Patient Instructions (Signed)
It was great to see you! ? ?Our plans for today:  ?- We are increasing your atenolol-chlorthalidone. Take the new prescription once daily. ?- Come back in 1 month.  ? ?We are checking some labs today, we will release these results to your MyChart. ? ?Take care and seek immediate care sooner if you develop any concerns.  ? ?Dr. Linwood Dibbles ? ?

## 2021-12-05 NOTE — Assessment & Plan Note (Signed)
Elevated today. Some inconsistencies with taking meds reported though reports to me good compliance. Will trial increase of atenolol. F/u in 1 month. ?

## 2021-12-05 NOTE — Assessment & Plan Note (Signed)
Improved since starting nurtec, continue to follow with Neuro. ?

## 2021-12-05 NOTE — Progress Notes (Signed)
BP (!) 160/100    Pulse (!) 112    Temp 97.6 F (36.4 C) (Oral)    Resp 16    Ht 5\' 5"  (1.651 m)    Wt 184 lb 8 oz (83.7 kg)    LMP 11/05/2021 (Approximate)    SpO2 98%    BMI 30.70 kg/m    Subjective:    Patient ID: [id number], female    DOB: Feb 19, 1981, 41 y.o.   MRN: [id number]  HPI: Hannah Gregory is a 41 y.o. female presenting on 12/05/2021 for comprehensive medical examination.   Hypertension: - Medications: atenolol-chlorthalidone, hydralazine prn - Compliance: reports good compliance with atenolol, last took this am. Hasn't taken hydralazine b/c hasn't checked BP. - Checking BP at home: no  Migraines - on effexor, nurtec, ubrelvy prn. Last used ubrelvy 2 weeks ago. Follows with neuro, last visit 2/14, at that time stopped nortriptyline, increased effexor, started nurtec. Started nurtec 3 weeks ago. Headaches have improved.   Anxiety/Depression - Medications: effexor, buspar - Taking: hasn't taken buspar in a while, didn't feel it helped - Counseling: no - Symptoms: none currently - Current stressors: pain - Coping Mechanisms: kids, getting outside  Cervicalgia - follows with PMR, status post C7-T1 IL ESI 10/20/2021 and PT. MRI cervical spine 3/5 with small L foraminal disc protrusion without stenosis. Worsened pain few weeks ago, received medrol dose pack and norco 3/1. Goes back on Monday for injection.   Chronic diarrhea - evaluated by Friday, last seen 10/28. Stool studies c/w exocrine pancreatic insufficiency, on creon. Still with watery stools though with some improvement. Due for follow up.   Depression Screen done today and results listed below:  Depression screen [facility] 2/9 12/05/2021 09/27/2021 08/14/2021 07/27/2021 05/31/2021  Decreased Interest 2 0 0 0 0  Down, Depressed, Hopeless 2 0 0 0 0  PHQ - 2 Score 4 0 0 0 0  Altered sleeping 3 0 0 0 0  Tired, decreased energy 3 0 0 0 0  Change in appetite 0 0 0 0 0  Feeling bad or failure about yourself  0 0 0 0 0   Trouble concentrating 0 0 0 0 0  Moving slowly or fidgety/restless 0 0 0 0 0  Suicidal thoughts 0 0 - 0 0  PHQ-9 Score 10 0 0 0 0  Difficult doing work/chores Somewhat difficult - - - Not difficult at all  Some recent data might be hidden    Past Medical History:  Past Medical History:  Diagnosis Date   Family history of breast cancer    10/20 genetic testing letter sent   Migraine    Scoliosis     Surgical History:  Past Surgical History:  Procedure Laterality Date   GALLBLADDER SURGERY  08/15/2020    Medications:  Current Outpatient Medications on File Prior to Visit  Medication Sig   ascorbic acid (VITAMIN C) 500 MG tablet Take 500 mg by mouth daily.   busPIRone (BUSPAR) 5 MG tablet Take 1 tablet (5 mg total) by mouth 2 (two) times daily as needed.   cholecalciferol (VITAMIN D3) 25 MCG (1000 UNIT) tablet Take 2,000 Units by mouth daily.   Cobalamin Combinations (B-12 PLUS FOLIC ACID) 2500-400 MCG TBDP    cyanocobalamin (,VITAMIN B-12,) 1000 MCG/ML injection INJECT 1ML=1000MCG INTRAMUSCULARLY EVERY MONTH   hydrALAZINE (APRESOLINE) 25 MG tablet Take 1 tablet (25 mg total) by mouth 3 (three) times daily as needed (BP above 150/90).   HYDROcodone-acetaminophen (NORCO/VICODIN) 5-325 MG tablet  Take 1 tablet by mouth 2 (two) times daily as needed.   lipase/protease/amylase (CREON) 36000 UNITS CPEP capsule Take 1 capsule (36,000 Units total) by mouth 3 (three) times daily with meals. May also take 1 capsule (36,000 Units total) as needed (with snacks).   NURTEC 75 MG TBDP Take by mouth.   oxyCODONE-acetaminophen (PERCOCET/ROXICET) 5-325 MG tablet Take 1 tablet by mouth 2 (two) times daily as needed.   Ubrogepant (UBRELVY) 100 MG TABS Take 1 tablet by mouth daily as needed. May take 2 hours later if no improvement   valACYclovir (VALTREX) 1000 MG tablet Take 2 tablets every 12 hours for 2 doses total, then stop taking.  Can take in the future if symptoms of cold sores just beginning  to help blunt the response (Patient taking differently: as needed (cold sores). Take 2 tablets every 12 hours for 2 doses total, then stop taking.  Can take in the future if symptoms of cold sores just beginning to help blunt the response)   Venlafaxine HCl 225 MG TB24 Take by mouth.   vitamin B-12 (CYANOCOBALAMIN) 1000 MCG tablet Take 1,000 mcg by mouth daily.   No current facility-administered medications on file prior to visit.    Allergies:  Allergies  Allergen Reactions   Imitrex [Sumatriptan] Other (See Comments)    Side effect , chest pain, flushed    Chantix [Varenicline Tartrate]     nausea   Prednisone Hives and Other (See Comments)    Numbness of her face    Abilify [Aripiprazole] Nausea Only    Social History:  Social History   Socioeconomic History   Marital status: Single    Spouse name: Not on file   Number of children: 3   Years of education: Not on file   Highest education level: High school graduate  Occupational History   Occupation: Pharmacy Tech     Comment: [employer]  Tobacco Use   Smoking status: Every Day    Packs/day: 0.25    Years: 22.00    Pack years: 5.50    Types: Cigarettes    Start date: 10/01/1997   Smokeless tobacco: Never  Vaping Use   Vaping Use: Never used  Substance and Sexual Activity   Alcohol use: Yes    Alcohol/week: 3.0 standard drinks    Types: 3 Shots of liquor per week   Drug use: No   Sexual activity: Not Currently    Partners: Male    Birth control/protection: Condom, Injection  Other Topics Concern   Not on file  Social History Narrative   Single mother of 3 children, lives across from her parents   Social Determinants of Health   Financial Resource Strain: Low Risk    Difficulty of Paying Living Expenses: Not very hard  Food Insecurity: Food Insecurity Present   Worried About Programme researcher, broadcasting/film/videounning Out of Food in the Last Year: Sometimes true   Baristaan Out of Food in the Last Year: Sometimes true   Transportation Needs: No Transportation Needs   Lack of Transportation (Medical): No   Lack of Transportation (Non-Medical): No  Physical Activity: Inactive   Days of Exercise per Week: 0 days   Minutes of Exercise per Session: 0 min  Stress: No Stress Concern Present   Feeling of Stress : Only a little  Social Connections: Socially Isolated   Frequency of Communication with Friends and Family: More than three times a week   Frequency of Social Gatherings with Friends and Family: Never  Attends Religious Services: Never   Active Member of Clubs or Organizations: No   Attends Engineer, structural: Never   Marital Status: Never married  Catering manager Violence: Not At Risk   Fear of Current or Ex-Partner: No   Emotionally Abused: No   Physically Abused: No   Sexually Abused: No   Social History   Tobacco Use  Smoking Status Every Day   Packs/day: 0.25   Years: 22.00   Pack years: 5.50   Types: Cigarettes   Start date: 10/01/1997  Smokeless Tobacco Never   Social History   Substance and Sexual Activity  Alcohol Use Yes   Alcohol/week: 3.0 standard drinks   Types: 3 Shots of liquor per week    Family History:  Family History  Problem Relation Age of Onset   Asthma Mother    Hypertension Mother    Hyperlipidemia Mother    Thyroid disease Mother    Heart attack Father    Migraines Father    Hypertension Father    Hyperlipidemia Father    Mental retardation Brother    COPD Brother    Hypertension Brother    Emphysema Brother    Breast cancer Maternal Grandmother    Breast cancer Maternal Aunt    Breast cancer Maternal Aunt     Past medical history, surgical history, medications, allergies, family history and social history reviewed with patient today and changes made to appropriate areas of the chart.      Objective:    BP (!) 160/100    Pulse (!) 112    Temp 97.6 F (36.4 C) (Oral)    Resp 16    Ht 5\' 5"  (1.651 m)    Wt 184 lb 8 oz (83.7 kg)     LMP 11/05/2021 (Approximate)    SpO2 98%    BMI 30.70 kg/m   Wt Readings from Last 3 Encounters:  12/05/21 184 lb 8 oz (83.7 kg)  09/27/21 184 lb (83.5 kg)  08/14/21 183 lb (83 kg)    Physical Exam Vitals reviewed.  Constitutional:      General: She is not in acute distress.    Appearance: She is obese. She is not ill-appearing or toxic-appearing.  HENT:     Right Ear: External ear normal.     Left Ear: External ear normal.     Nose: Nose normal.     Mouth/Throat:     Mouth: Mucous membranes are moist.     Pharynx: Oropharynx is clear.  Eyes:     Extraocular Movements: Extraocular movements intact.  Cardiovascular:     Rate and Rhythm: Regular rhythm. Tachycardia present.     Heart sounds: Normal heart sounds. No murmur heard. Pulmonary:     Effort: Pulmonary effort is normal.     Breath sounds: Normal breath sounds.  Abdominal:     General: Bowel sounds are normal.     Palpations: Abdomen is soft.     Tenderness: There is no abdominal tenderness.  Musculoskeletal:        General: Normal range of motion.     Right lower leg: No edema.     Left lower leg: No edema.  Lymphadenopathy:     Cervical: No cervical adenopathy.  Skin:    General: Skin is warm and dry.  Neurological:     Mental Status: She is alert and oriented to person, place, and time. Mental status is at baseline.  Psychiatric:        Mood  and Affect: Mood normal.        Behavior: Behavior normal.    Results for orders placed or performed in visit on 09/27/21  Novel Coronavirus, NAA ([facility])  Result Value Ref Range   SARS-CoV-2, NAA Not Detected Not Detected  SARS-COV-2, NAA 2 DAY TAT  Result Value Ref Range   SARS-CoV-2, NAA 2 DAY TAT Performed   Specimen status report  Result Value Ref Range   specimen status report Comment       Assessment & Plan:   Problem List Items Addressed This Visit       Cardiovascular and Mediastinum   Labile essential hypertension    Elevated today. Some  inconsistencies with taking meds reported though reports to me good compliance. Will trial increase of atenolol. F/u in 1 month.      Relevant Medications   atenolol-chlorthalidone (TENORETIC) 100-25 MG tablet   Other Relevant Orders   Comprehensive metabolic panel   Lipid panel   Migraine without aura and without status migrainosus, not intractable    Improved since starting nurtec, continue to follow with Neuro.      Relevant Medications   HYDROcodone-acetaminophen (NORCO/VICODIN) 5-325 MG tablet   oxyCODONE-acetaminophen (PERCOCET/ROXICET) 5-325 MG tablet   Venlafaxine HCl 225 MG TB24   atenolol-chlorthalidone (TENORETIC) 100-25 MG tablet     Other   B12 deficiency   Relevant Orders   B12   Headache disorder   Relevant Medications   HYDROcodone-acetaminophen (NORCO/VICODIN) 5-325 MG tablet   oxyCODONE-acetaminophen (PERCOCET/ROXICET) 5-325 MG tablet   Venlafaxine HCl 225 MG TB24   History of anxiety    Encouraged prn buspar when needed, may increase to 10mg  prn.      Relevant Medications   Venlafaxine HCl 225 MG TB24   Mild depression   Relevant Medications   Venlafaxine HCl 225 MG TB24   Neck pain   Relevant Medications   HYDROcodone-acetaminophen (NORCO/VICODIN) 5-325 MG tablet   oxyCODONE-acetaminophen (PERCOCET/ROXICET) 5-325 MG tablet   Tobacco use disorder   Relevant Orders   Pneumococcal conjugate vaccine 20-valent (Prevnar 20) (Completed)   Other Visit Diagnoses     Annual physical exam    -  Primary   Relevant Orders   B12   Comprehensive metabolic panel   CBC   Lipid panel   Hemoglobin A1c   Need for vaccination against Streptococcus pneumoniae       Relevant Orders   Pneumococcal conjugate vaccine 20-valent (Prevnar 20) (Completed)   Class 1 obesity with serious comorbidity and body mass index (BMI) of 30.0 to 30.9 in adult, unspecified obesity type       Relevant Orders   Comprehensive metabolic panel   Lipid panel   Hemoglobin A1c         Follow up plan: Return in about 4 weeks (around 01/02/2022) for BP.   LABORATORY TESTING:  - Pap smear: up to date  IMMUNIZATIONS:   - Tdap: Tetanus vaccination status reviewed: last tetanus booster within 10 years. - Influenza: previously received in October - Pneumococcal: Administered today - HPV: Not applicable - Shingrix vaccine: Not applicable - COVID vaccine: has received 4 doses of mRNA vaccine  SCREENING: - Mammogram: Not applicable  - Colonoscopy: Not applicable  - Bone Density: Not applicable  - Lung Cancer Screening: Not applicable   Hep C Screening: UTD STD testing and prevention (HIV/chl/gon/syphilis): no concerns Sexual History: rare Menstrual History/LMP/Abnormal Bleeding: regular menses, LMP 3/2 Incontinence Symptoms: none  Osteoporosis: Discussed high calcium and vitamin D supplementation,  weight bearing exercises  Advanced Care Planning: A voluntary discussion about advance care planning including the explanation and discussion of advance directives.  Discussed health care proxy and Living will, and the patient was able to identify a health care proxy as mom, Hannah Gregory.  Patient does not have a living will at present time. If patient does have living will, I have requested they bring this to the clinic to be scanned in to their chart.  PATIENT COUNSELING:   Advised to take 1 mg of folate supplement per day if capable of pregnancy.   Sexuality: Discussed sexually transmitted diseases, partner selection, use of condoms, avoidance of unintended pregnancy  and contraceptive alternatives.   Advised to avoid cigarette smoking.  I discussed with the patient that most people either abstain from alcohol or drink within safe limits (<=14/week and <=4 drinks/occasion for males, <=7/weeks and <= 3 drinks/occasion for females) and that the risk for alcohol disorders and other health effects rises proportionally with the number of drinks per week and how often a  drinker exceeds daily limits.  Discussed cessation/primary prevention of drug use and availability of treatment for abuse.   Diet: Encouraged to adjust caloric intake to maintain  or achieve ideal body weight, to reduce intake of dietary saturated fat and total fat, to limit sodium intake by avoiding high sodium foods and not adding table salt, and to maintain adequate dietary potassium and calcium preferably from fresh fruits, vegetables, and low-fat dairy products.    stressed the importance of regular exercise  Injury prevention: Discussed safety belts, safety helmets, smoke detector, smoking near bedding or upholstery.   Dental health: Discussed importance of regular tooth brushing, flossing, and dental visits.    NEXT PREVENTATIVE PHYSICAL DUE IN 1 YEAR. Return in about 4 weeks (around 01/02/2022) for BP.

## 2021-12-05 NOTE — Assessment & Plan Note (Signed)
Encouraged prn buspar when needed, may increase to 10mg  prn. ?

## 2021-12-06 LAB — LIPID PANEL
Cholesterol: 213 mg/dL — ABNORMAL HIGH (ref <200)
HDL: 49 mg/dL — ABNORMAL LOW (ref >=50)
Non-HDL Cholesterol (Calc): 164 mg/dL (calc) — ABNORMAL HIGH (ref <130)
Total CHOL/HDL Ratio: 4.3 (calc) (ref <5.0)
Triglycerides: 537 mg/dL — ABNORMAL HIGH (ref <150)

## 2021-12-06 LAB — COMPREHENSIVE METABOLIC PANEL
AG Ratio: 1.8 (calc) (ref 1.0–2.5)
ALT: 13 U/L (ref 6–29)
AST: 13 U/L (ref 10–30)
Albumin: 4.2 g/dL (ref 3.6–5.1)
Alkaline phosphatase (APISO): 90 U/L (ref 31–125)
BUN/Creatinine Ratio: 12 (calc) (ref 6–22)
BUN: 13 mg/dL (ref 7–25)
CO2: 28 mmol/L (ref 20–32)
Calcium: 9.6 mg/dL (ref 8.6–10.2)
Chloride: 101 mmol/L (ref 98–110)
Creat: 1.09 mg/dL — ABNORMAL HIGH (ref 0.50–0.97)
Globulin: 2.4 g/dL (calc) (ref 1.9–3.7)
Glucose, Bld: 114 mg/dL — ABNORMAL HIGH (ref 65–99)
Potassium: 3.7 mmol/L (ref 3.5–5.3)
Sodium: 139 mmol/L (ref 135–146)
Total Bilirubin: 0.5 mg/dL (ref 0.2–1.2)
Total Protein: 6.6 g/dL (ref 6.1–8.1)

## 2021-12-06 LAB — CBC
HCT: 42 % (ref 35.0–45.0)
Hemoglobin: 14.4 g/dL (ref 11.7–15.5)
MCH: 34.3 pg — ABNORMAL HIGH (ref 27.0–33.0)
MCHC: 34.3 g/dL (ref 32.0–36.0)
MCV: 100 fL (ref 80.0–100.0)
MPV: 9.4 fL (ref 7.5–12.5)
Platelets: 382 10*3/uL (ref 140–400)
RBC: 4.2 10*6/uL (ref 3.80–5.10)
RDW: 11.8 % (ref 11.0–15.0)
WBC: 13.8 10*3/uL — ABNORMAL HIGH (ref 3.8–10.8)

## 2021-12-06 LAB — HEMOGLOBIN A1C
Hgb A1c MFr Bld: 5.1 % of total Hgb (ref <5.7)
Mean Plasma Glucose: 100 mg/dL
eAG (mmol/L): 5.5 mmol/L

## 2021-12-06 LAB — VITAMIN B12: Vitamin B-12: 481 pg/mL (ref 200–1100)

## 2021-12-07 ENCOUNTER — Ambulatory Visit: Payer: Managed Care, Other (non HMO)

## 2021-12-21 DIAGNOSIS — R200 Anesthesia of skin: Secondary | ICD-10-CM | POA: Insufficient documentation

## 2021-12-25 ENCOUNTER — Telehealth (INDEPENDENT_AMBULATORY_CARE_PROVIDER_SITE_OTHER): Payer: Managed Care, Other (non HMO) | Admitting: Family Medicine

## 2021-12-25 ENCOUNTER — Encounter: Payer: Self-pay | Admitting: Family Medicine

## 2021-12-25 DIAGNOSIS — J029 Acute pharyngitis, unspecified: Secondary | ICD-10-CM

## 2021-12-25 NOTE — Progress Notes (Signed)
Virtual Visit via Video Note ? ?I connected with Hannah Gregory on 12/25/21 at  1:20 PM EDT by a video enabled telemedicine application and verified that I am speaking with the correct person using two identifiers. ? ?Location: ?Patient: home ?Provider: [facility] ?  ?I discussed the limitations of evaluation and management by telemedicine and the availability of in person appointments. The patient expressed understanding and agreed to proceed. ? ?History of Present Illness: ? ?UPPER RESPIRATORY TRACT INFECTION ?- symptom onset 2 days ago with sore throat ?- difficult to swallow due to throat pain ?- has noticed whitish discoloration to back of tongue but denies white patches on tonsils. Tonsils are red and enlarged. She is concerned for strep.  ?- has not tested for COVID ? ?Fever: yes, Tmax 101F ?Cough: yes, productive of mucus ?Shortness of breath: no ?Chest pain: no ?Chest congestion: no ?Nasal congestion: yes ?Runny nose: yes ?Sneezing: yes ?Sore throat: yes, with redness. ?Swollen glands: yes ?Sinus pressure: no ?Ear pressure: yes b/l ?Vomiting: no ?Rash: no ?Sick contacts: no ?Strep contacts: no  ?Relief with OTC cold/cough medications:  hasn't tried   ?Treatments attempted:  benadryl, claritin, chlorphenamine, tylenol, ibuprofen ?  ? ?Observations/Objective: ? ?Well appearing, in NAD. Speaks in full sentences. Comfortable WOB on RA. No resp distress.  ? ? ?Assessment and Plan: ? ?URI ?Mild-mod sx. Would be a candidate for COVID antiviral treatment if positive. Will also test for strep. Reviewed OTC symptom relief, self-quarantine guidelines, and emergency precautions.  ? ? ?  ?I discussed the assessment and treatment plan with the patient. The patient was provided an opportunity to ask questions and all were answered. The patient agreed with the plan and demonstrated an understanding of the instructions. ?  ?The patient was advised to call back or seek an in-person evaluation if the symptoms worsen or if the  condition fails to improve as anticipated. ? ?I provided 10 minutes of non-face-to-face time during this encounter. ? ? ?Caro Laroche, DO ?

## 2022-01-01 NOTE — Progress Notes (Deleted)
Name: Hannah Gregory   MRN: [id number]    DOB: [birth year]   Date:01/01/2022 ? ?     Progress Note ? ?Subjective ? ?Chief Complaint ? ?Follow Up ? ?HPI ? ?Hypertension Benign: BP oscillates but has been doing better lately. No chst pain or palpitation Taking Tenoretic half bid and denies cramping, discussed high potassium diet and recheck labs during CPE soon  ? ?Daily headache/Migraine headaches: chronic, she is under the care of neurologist, Dr. Malvin Johns and his PA. Taking higher dose of Effexor, Ajovy also Ubrelvy. She still has daily headaches but not migraines. She is going to have MRI of neck soon since since lack of lordosis of c-spine on x-ray and paresthesias both arms. She had a normal MRI brain 2021  ? ?AnxietyMDD  she is on higher dose of Effexor given by neurologist and is feeling better, took only a few of the buspar and seems to work, continue prn use. She has a long history of depression, worse when father had recent bypass surgery about 3 years ago. She used to feel very overwhelmed as single mother, but coping better. Effexor has been helpful. Denies crying spells.   ? 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 ?Low pancreatic Elastase: seen by Dr. Allegra Lai and diagnosed with exocrine pancreatic insuffiencey and was given Creon to take before meals. She states abdominal has decreased significantly also diarrhea not as frequent. She just started medications about one week ago.  ? ?Patient Active Problem List  ? Diagnosis Date Noted  ? Tobacco use disorder 12/05/2021  ? Labile essential hypertension 12/05/2021  ? Headache disorder 07/31/2021  ? Visual disturbance 07/31/2021  ? 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 B12 deficiency 12/11/2019  ? Vitamin D deficiency 12/11/2019  ? Family history of pernicious anemia 12/11/2019  ? Migraine without aura and without status migrainosus, not intractable 10/14/2019  ? Menometrorrhagia  06/17/2019  ? Chronic daily headache 11/29/2016  ? Neck pain 11/29/2016  ? Scoliosis of thoracolumbar spine 11/29/2016  ? Mild depression 11/29/2016  ? History of anxiety 11/29/2016  ? ? ?Past Surgical History:  ?Procedure Laterality Date  ? GALLBLADDER SURGERY  08/15/2020  ? ? ?Family History  ?Problem Relation Age of Onset  ? Asthma Mother   ? Hypertension Mother   ? Hyperlipidemia Mother   ? Thyroid disease Mother   ? Heart attack Father   ? Migraines Father   ? Hypertension Father   ? Hyperlipidemia Father   ? Mental retardation Brother   ? COPD Brother   ? Hypertension Brother   ? Emphysema Brother   ? Breast cancer Maternal Grandmother   ? Breast cancer Maternal Aunt   ? Breast cancer Maternal Aunt   ? ? ?Social History  ? ?Tobacco Use  ? Smoking status: Every Day  ?  Packs/day: 0.25  ?  Years: 22.00  ?  Pack years: 5.50  ?  Types: Cigarettes  ?  Start date: 10/01/1997  ? Smokeless tobacco: Never  ?Substance Use Topics  ? Alcohol use: Yes  ?  Alcohol/week: 3.0 standard drinks  ?  Types: 3 Shots of liquor per week  ? ? ? ?Current Outpatient Medications:  ?  ascorbic acid (VITAMIN C) 500 MG tablet, Take 500 mg by mouth daily., Disp: , Rfl:  ?  atenolol-chlorthalidone (TENORETIC) 100-25 MG tablet, Take 1 tablet by mouth daily., Disp: 90 tablet, Rfl: 0 ?  busPIRone (  BUSPAR) 5 MG tablet, Take 1 tablet (5 mg total) by mouth 2 (two) times daily as needed., Disp: 60 tablet, Rfl: 0 ?  cholecalciferol (VITAMIN D3) 25 MCG (1000 UNIT) tablet, Take 2,000 Units by mouth daily., Disp: , Rfl:  ?  Cobalamin Combinations (B-12 PLUS FOLIC ACID) 2500-400 MCG TBDP, , Disp: , Rfl:  ?  cyanocobalamin (,VITAMIN B-12,) 1000 MCG/ML injection, INJECT 1ML=1000MCG INTRAMUSCULARLY EVERY MONTH, Disp: 1 mL, Rfl: 11 ?  hydrALAZINE (APRESOLINE) 25 MG tablet, Take 1 tablet (25 mg total) by mouth 3 (three) times daily as needed (BP above 150/90)., Disp: 90 tablet, Rfl: 0 ?  HYDROcodone-acetaminophen (NORCO/VICODIN) 5-325 MG tablet, Take 1 tablet  by mouth 2 (two) times daily as needed., Disp: , Rfl:  ?  lipase/protease/amylase (CREON) 36000 UNITS CPEP capsule, Take 1 capsule (36,000 Units total) by mouth 3 (three) times daily with meals. May also take 1 capsule (36,000 Units total) as needed (with snacks)., Disp: 240 capsule, Rfl: 3 ?  NURTEC 75 MG TBDP, Take by mouth., Disp: , Rfl:  ?  oxyCODONE-acetaminophen (PERCOCET/ROXICET) 5-325 MG tablet, Take 1 tablet by mouth 2 (two) times daily as needed., Disp: , Rfl:  ?  Ubrogepant (UBRELVY) 100 MG TABS, Take 1 tablet by mouth daily as needed. May take 2 hours later if no improvement, Disp: 10 tablet, Rfl: 0 ?  valACYclovir (VALTREX) 1000 MG tablet, Take 2 tablets every 12 hours for 2 doses total, then stop taking.  Can take in the future if symptoms of cold sores just beginning to help blunt the response (Patient taking differently: as needed (cold sores). Take 2 tablets every 12 hours for 2 doses total, then stop taking.  Can take in the future if symptoms of cold sores just beginning to help blunt the response), Disp: 12 tablet, Rfl: 1 ?  Venlafaxine HCl 225 MG TB24, Take by mouth., Disp: , Rfl:  ?  vitamin B-12 (CYANOCOBALAMIN) 1000 MCG tablet, Take 1,000 mcg by mouth daily., Disp: , Rfl:  ? ?Allergies  ?Allergen Reactions  ? Imitrex [Sumatriptan] Other (See Comments)  ?  Side effect , chest pain, flushed   ? Chantix [Varenicline Tartrate]   ?  nausea  ? Prednisone Hives and Other (See Comments)  ?  Numbness of her face ?  ? Abilify [Aripiprazole] Nausea Only  ? ? ?I personally reviewed active problem list, medication list, allergies, family history, social history, health maintenance with the patient/caregiver today. ? ? ?ROS ? ?*** ? ?Objective ? ?There were no vitals filed for this visit. ? ?There is no height or weight on file to calculate BMI. ? ?Physical Exam ?*** ? ?Recent Results (from the past 2160 hour(s))  ?B12     Status: None  ? Collection Time: 12/05/21 11:10 AM  ?Result Value Ref Range  ?  Vitamin B-12 481 200 - 1,100 pg/mL  ?Lipid panel     Status: Abnormal  ? Collection Time: 12/05/21 11:10 AM  ?Result Value Ref Range  ? Cholesterol 213 (H) <200 mg/dL  ? HDL 49 (L) > OR = 50 mg/dL  ? Triglycerides 537 (H) <150 mg/dL  ?  Comment: . ?If a non-fasting specimen was collected, consider ?repeat triglyceride testing on a fasting specimen ?if clinically indicated.  ?Henrene Pastor al. J. of Clin. Lipidol. 2015;9:129-169. ?. ?. ?There is increased risk of pancreatitis when the  ?triglyceride concentration is very high  ?(> or = 500 mg/dL, especially if > or = 9163 mg/dL).  ?Henrene Pastor al. Shela Commons.  of Clin. Lipidol. 2015;9:129-169. ?. ?  ? LDL Cholesterol (Calc)  mg/dL (calc)  ?  Comment: . ?LDL cholesterol not calculated. Triglyceride levels ?greater than 400 mg/dL invalidate calculated LDL results. ?. ?Reference range: <100 ?Marland Kitchen. ?Desirable range <100 mg/dL for primary prevention;   ?<70 mg/dL for patients with CHD or diabetic patients  ?with > or = 2 CHD risk factors. ?. ?LDL-C is now calculated using the Martin-Hopkins  ?calculation, which is a validated novel method providing  ?better accuracy than the Friedewald equation in the  ?estimation of LDL-C.  ?Horald PollenMartin SS et al. Lenox AhrJAMA. 5956;387(562013;310(19): 2061-2068  ?(http://education.[facility].com/faq/FAQ164) ?  ? Total CHOL/HDL Ratio 4.3 <5.0 (calc)  ? Non-HDL Cholesterol (Calc) 164 (H) <130 mg/dL (calc)  ?  Comment: For patients with diabetes plus 1 major ASCVD risk  ?factor, treating to a non-HDL-C goal of <100 mg/dL  ?(LDL-C of <70 mg/dL) is considered a therapeutic  ?option. ?  ?Comprehensive metabolic panel     Status: Abnormal  ? Collection Time: 12/05/21 11:10 AM  ?Result Value Ref Range  ? Glucose, Bld 114 (H) 65 - 99 mg/dL  ?  Comment: . ?           Fasting reference interval ?. ?For someone without known diabetes, a glucose value ?between 100 and 125 mg/dL is consistent with ?prediabetes and should be confirmed with a ?follow-up test. ?. ?  ? BUN 13 7 - 25 mg/dL   ? Creat 1.09 (H) 0.50 - 0.97 mg/dL  ? BUN/Creatinine Ratio 12 6 - 22 (calc)  ? Sodium 139 135 - 146 mmol/L  ? Potassium 3.7 3.5 - 5.3 mmol/L  ? Chloride 101 98 - 110 mmol/L  ? CO2 28 20 - 32 mmol/L  ? Ca

## 2022-01-02 ENCOUNTER — Ambulatory Visit: Payer: Managed Care, Other (non HMO) | Admitting: Family Medicine

## 2022-01-17 ENCOUNTER — Encounter: Payer: Self-pay | Admitting: Family Medicine

## 2022-02-02 NOTE — Progress Notes (Signed)
Name: Hannah Gregory   MRN: [id number]    DOB: 1981/04/29   Date:02/12/2022 ? ?     Progress Note ? ?Subjective ? ?Chief Complaint ? ?Follow up  ? ?HPI ? ?Hypertension Benign: labile hypertension, but lately it has been staying high, lowest in the 130's range, on Atenolol chlorthalidone 100/25 daily, no cramping, potassium has been normal. We will check urine micro and add norvasc if no improvement refer to Nephrologist  ? ?Daily headache/Migraine headaches: chronic, she is under the care of neurologist, Dr. Melrose Nakayama and his PA. Taking higher dose of Effexor 225 mg , Nurtec every other day  Ubrelvy prn. She still has daily headaches but not migraines. She just started botox injections  ? ?AnxietyMDD  she is on higher dose of Effexor given by neurologist and is feeling better, took only a few of the buspar and seems to work, taking it less than once a week.  She has a long history of depression She coping better about being a single mother  ? 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 ?Low pancreatic Elastase: seen by Dr. Marius Ditch and diagnosed with exocrine pancreatic insuffiencey and was given Creon to take before meals. She states abdominal has decreased significantly but still has episodes of diarrhea.  ? ?Cervical radiculitis: under the care of PMR, she was having radiculitis , having some trigger point injection, some epidural injection and had PT and is doing better now  ? ?She has a new sexual partner and would like to be checked for STI's today , no symptoms  ? ?Patient Active Problem List  ? Diagnosis Date Noted  ? Right arm numbness 12/21/2021  ? Tobacco use disorder 12/05/2021  ? Labile essential hypertension 12/05/2021  ? Headache disorder 07/31/2021  ? Visual disturbance 07/31/2021  ? 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 B12 deficiency 12/11/2019  ? Vitamin D deficiency 12/11/2019  ? Family history of pernicious anemia  12/11/2019  ? Migraine without aura and without status migrainosus, not intractable 10/14/2019  ? Menometrorrhagia 06/17/2019  ? Chronic daily headache 11/29/2016  ? Neck pain 11/29/2016  ? Scoliosis of thoracolumbar spine 11/29/2016  ? Mild depression 11/29/2016  ? History of anxiety 11/29/2016  ? ? ?Past Surgical History:  ?Procedure Laterality Date  ? GALLBLADDER SURGERY  08/15/2020  ? ? ?Family History  ?Problem Relation Age of Onset  ? Asthma Mother   ? Hypertension Mother   ? Hyperlipidemia Mother   ? Thyroid disease Mother   ? Heart attack Father   ? Migraines Father   ? Hypertension Father   ? Hyperlipidemia Father   ? Mental retardation Brother   ? COPD Brother   ? Hypertension Brother   ? Emphysema Brother   ? Breast cancer Maternal Grandmother   ? Breast cancer Maternal Aunt   ? Breast cancer Maternal Aunt   ? ? ?Social History  ? ?Tobacco Use  ? Smoking status: Every Day  ?  Packs/day: 0.25  ?  Years: 22.00  ?  Pack years: 5.50  ?  Types: Cigarettes  ?  Start date: 10/01/1997  ? Smokeless tobacco: Never  ?Substance Use Topics  ? Alcohol use: Yes  ?  Alcohol/week: 3.0 standard drinks  ?  Types: 3 Shots of liquor per week  ? ? ? ?Current Outpatient Medications:  ?  ascorbic acid (VITAMIN C) 500 MG tablet, Take 500 mg by mouth daily., Disp: ,  Rfl:  ?  atenolol-chlorthalidone (TENORETIC) 100-25 MG tablet, Take 1 tablet by mouth daily., Disp: 90 tablet, Rfl: 0 ?  busPIRone (BUSPAR) 5 MG tablet, Take 1 tablet (5 mg total) by mouth 2 (two) times daily as needed., Disp: 60 tablet, Rfl: 0 ?  cholecalciferol (VITAMIN D3) 25 MCG (1000 UNIT) tablet, Take 2,000 Units by mouth daily., Disp: , Rfl:  ?  hydrALAZINE (APRESOLINE) 25 MG tablet, Take 1 tablet (25 mg total) by mouth 3 (three) times daily as needed (BP above 150/90)., Disp: 90 tablet, Rfl: 0 ?  lipase/protease/amylase (CREON) 36000 UNITS CPEP capsule, Take 1 capsule (36,000 Units total) by mouth 3 (three) times daily with meals. May also take 1 capsule (36,000  Units total) as needed (with snacks)., Disp: 240 capsule, Rfl: 3 ?  NURTEC 75 MG TBDP, Take by mouth., Disp: , Rfl:  ?  oxyCODONE-acetaminophen (PERCOCET/ROXICET) 5-325 MG tablet, Take 1 tablet by mouth 2 (two) times daily as needed., Disp: , Rfl:  ?  Ubrogepant (UBRELVY) 100 MG TABS, Take 1 tablet by mouth daily as needed. May take 2 hours later if no improvement, Disp: 10 tablet, Rfl: 0 ?  valACYclovir (VALTREX) 1000 MG tablet, Take 2 tablets every 12 hours for 2 doses total, then stop taking.  Can take in the future if symptoms of cold sores just beginning to help blunt the response (Patient taking differently: as needed (cold sores). Take 2 tablets every 12 hours for 2 doses total, then stop taking.  Can take in the future if symptoms of cold sores just beginning to help blunt the response), Disp: 12 tablet, Rfl: 1 ?  venlafaxine XR (EFFEXOR-XR) 150 MG 24 hr capsule, Take 150 mg by mouth daily., Disp: , Rfl:  ?  vitamin B-12 (CYANOCOBALAMIN) 1000 MCG tablet, Take 1,000 mcg by mouth daily., Disp: , Rfl:  ?  Cobalamin Combinations (0000000 PLUS FOLIC ACID) 123456 MCG TBDP, , Disp: , Rfl:  ?  cyanocobalamin (,VITAMIN B-12,) 1000 MCG/ML injection, INJECT 1ML=1000MCG INTRAMUSCULARLY EVERY MONTH (Patient not taking: Reported on 02/12/2022), Disp: 1 mL, Rfl: 11 ?  HYDROcodone-acetaminophen (NORCO/VICODIN) 5-325 MG tablet, Take 1 tablet by mouth 2 (two) times daily as needed. (Patient not taking: Reported on 02/12/2022), Disp: , Rfl:  ?  Venlafaxine HCl 225 MG TB24, Take by mouth. (Patient not taking: Reported on 02/12/2022), Disp: , Rfl:  ? ?Allergies  ?Allergen Reactions  ? Imitrex [Sumatriptan] Other (See Comments)  ?  Side effect , chest pain, flushed   ? Chantix [Varenicline Tartrate]   ?  nausea  ? Prednisone Hives and Other (See Comments)  ?  Numbness of her face ?  ? Abilify [Aripiprazole] Nausea Only  ? ? ?I personally reviewed active problem list, medication list, allergies, family history, social history with the  patient/caregiver today. ? ? ?ROS ? ?Constitutional: Negative for fever or weight change.  ?Respiratory: Negative for cough and shortness of breath.   ?Cardiovascular: Negative for chest pain or palpitations.  ?Gastrointestinal: Negative for abdominal pain, no bowel changes.  ?Musculoskeletal: Negative for gait problem or joint swelling.  ?Skin: Negative for rash.  ?Neurological: Negative for dizziness or headache.  ?No other specific complaints in a complete review of systems (except as listed in HPI above).  ? ?Objective ? ?Vitals:  ? 02/12/22 0808  ?BP: (!) 142/98  ?Pulse: 80  ?Resp: 16  ?Temp: 98.3 ?F (36.8 ?C)  ?TempSrc: Oral  ?SpO2: 96%  ?Weight: 185 lb 1.6 oz (84 kg)  ?Height: 5\' 5"  (1.651  m)  ? ? ?Body mass index is 30.8 kg/m?. ? ?Physical Exam ? ?Constitutional: Patient appears well-developed and well-nourished. Obese  No distress.  ?HEENT: head atraumatic, normocephalic, pupils equal and reactive to light, neck supple ?Cardiovascular: Normal rate, regular rhythm and normal heart sounds.  No murmur heard. No BLE edema. ?Pulmonary/Chest: Effort normal and breath sounds normal. No respiratory distress. ?Abdominal: Soft.  There is no tenderness. ?Psychiatric: Patient has a normal mood and affect. behavior is normal. Judgment and thought content normal.  ? ?Recent Results (from the past 2160 hour(s))  ?B12     Status: None  ? Collection Time: 12/05/21 11:10 AM  ?Result Value Ref Range  ? Vitamin B-12 481 200 - 1,100 pg/mL  ?Lipid panel     Status: Abnormal  ? Collection Time: 12/05/21 11:10 AM  ?Result Value Ref Range  ? Cholesterol 213 (H) <200 mg/dL  ? HDL 49 (L) > OR = 50 mg/dL  ? Triglycerides 537 (H) <150 mg/dL  ?  Comment: . ?If a non-fasting specimen was collected, consider ?repeat triglyceride testing on a fasting specimen ?if clinically indicated.  ?Garald Balding al. J. of Clin. Lipidol. [id number]. ?. ?. ?There is increased risk of pancreatitis when the  ?triglyceride concentration is very high  ?(>  or = 500 mg/dL, especially if > or = 1000 mg/dL).  ?Garald Balding al. J. of Clin. Lipidol. [id number]. ?. ?  ? LDL Cholesterol (Calc)  mg/dL (calc)  ?  Comment: . ?LDL cholesterol not calculated. Trigl

## 2022-02-06 ENCOUNTER — Encounter: Payer: Self-pay | Admitting: Family Medicine

## 2022-02-09 ENCOUNTER — Ambulatory Visit: Payer: Self-pay | Admitting: *Deleted

## 2022-02-09 NOTE — Telephone Encounter (Signed)
Reason for Disposition ? [1] MILD dizziness (e.g., walking normally) AND [2] has NOT been evaluated by physician for this  (Exception: dizziness caused by heat exposure, sudden standing, or poor fluid intake) ? ?Answer Assessment - Initial Assessment Questions ?1. DESCRIPTION: "Describe your dizziness." ?    I have an appt on Monday.   I take BP medication.   My BP has been ok but last couple of days I've had a couple of dizzy spells.   If I move too quick or get up I am dizzy.   I'm swimmy headed.   Just last 2 days.   My allergies are acting up but I do have sinus drainage.   ?2. LIGHTHEADED: "Do you feel lightheaded?" (e.g., somewhat faint, woozy, weak upon standing) ?    Just when I get up I'm a little dizzy or moving too fast.    I don't normally check my BP so I've not checked it. ? ?3. VERTIGO: "Do you feel like either you or the room is spinning or tilting?" (i.e. vertigo) ?    I've had vertigo before but this feels different.    ?4. SEVERITY: "How bad is it?"  "Do you feel like you are going to faint?" "Can you stand and walk?" ?  - MILD: Feels slightly dizzy, but walking normally. ?  - MODERATE: Feels unsteady when walking, but not falling; interferes with normal activities (e.g., school, work). ?  - SEVERE: Unable to walk without falling, or requires assistance to walk without falling; feels like passing out now.  ?    Mild just happens when I get up from sitting or move too fast ?5. ONSET:  "When did the dizziness begin?" ?    A couple of days ago ?6. AGGRAVATING FACTORS: "Does anything make it worse?" (e.g., standing, change in head position) ?    Moving fast and getting up from sitting or from bed ?7. HEART RATE: "Can you tell me your heart rate?" "How many beats in 15 seconds?"  (Note: not all patients can do this)   ?    Not asked ?8. CAUSE: "What do you think is causing the dizziness?" ?    I am having some sinus drainage down the back of my throat from allergies/pollen.   That may be it. ?9.  RECURRENT SYMPTOM: "Have you had dizziness before?" If Yes, ask: "When was the last time?" "What happened that time?" ?    Yes   I've had vertigo but this feels different than vertigo. ?10. OTHER SYMPTOMS: "Do you have any other symptoms?" (e.g., fever, chest pain, vomiting, diarrhea, bleeding) ?      Denies shortness of breath chest pain, sweating, diarrhea, vomiting being sick recently. ?11. PREGNANCY: "Is there any chance you are pregnant?" "When was your last menstrual period?" ?      Not asked ? ?Protocols used: Dizziness - Lightheadedness-A-AH ? ?

## 2022-02-09 NOTE — Telephone Encounter (Signed)
?  Chief Complaint: dizziness with moving too fast or getting up from sitting too fast ?Symptoms: abvoe ?Frequency: Started a couple of days ago ?Pertinent Negatives: Patient denies shortness of breath, chest pain, diarrhea, vomiting or recent illness ?Disposition: [] ED /[] Urgent Care (no appt availability in office) / [x] Appointment(In office/virtual)/ []  [facility]/ [] Home Care/ [] Refused Recommended Disposition /[] [facility]/ []  Follow-up with PCP ?Additional Notes:   ?

## 2022-02-12 ENCOUNTER — Ambulatory Visit: Payer: Managed Care, Other (non HMO) | Admitting: Family Medicine

## 2022-02-12 ENCOUNTER — Encounter: Payer: Self-pay | Admitting: Family Medicine

## 2022-02-12 ENCOUNTER — Other Ambulatory Visit (HOSPITAL_COMMUNITY)
Admission: RE | Admit: 2022-02-12 | Discharge: 2022-02-12 | Disposition: A | Discharge status: Home / Self Care | Payer: Managed Care, Other (non HMO) | Source: Ambulatory Visit | Attending: Family Medicine | Admitting: Family Medicine

## 2022-02-12 VITALS — BP 150/90 | HR 80 | Temp 98.3°F | Resp 16 | Ht 65.0 in | Wt 185.1 lb

## 2022-02-12 DIAGNOSIS — F32A Depression, unspecified: Secondary | ICD-10-CM

## 2022-02-12 DIAGNOSIS — I10 Essential (primary) hypertension: Secondary | ICD-10-CM | POA: Diagnosis not present

## 2022-02-12 DIAGNOSIS — E559 Vitamin D deficiency, unspecified: Secondary | ICD-10-CM | POA: Diagnosis not present

## 2022-02-12 DIAGNOSIS — K8689 Other specified diseases of pancreas: Secondary | ICD-10-CM

## 2022-02-12 DIAGNOSIS — E785 Hyperlipidemia, unspecified: Secondary | ICD-10-CM | POA: Diagnosis not present

## 2022-02-12 DIAGNOSIS — G43009 Migraine without aura, not intractable, without status migrainosus: Secondary | ICD-10-CM

## 2022-02-12 DIAGNOSIS — Z113 Encounter for screening for infections with a predominantly sexual mode of transmission: Secondary | ICD-10-CM | POA: Diagnosis not present

## 2022-02-12 DIAGNOSIS — E538 Deficiency of other specified B group vitamins: Secondary | ICD-10-CM

## 2022-02-12 DIAGNOSIS — G43011 Migraine without aura, intractable, with status migrainosus: Secondary | ICD-10-CM | POA: Insufficient documentation

## 2022-02-12 MED ORDER — AMLODIPINE BESYLATE 2.5 MG PO TABS: 2.5000 mg | ORAL_TABLET | Freq: Every day | ORAL | 0 refills | Status: DC

## 2022-02-12 MED ORDER — ATENOLOL-CHLORTHALIDONE 100-25 MG PO TABS: 1.0000 | ORAL_TABLET | Freq: Every day | ORAL | 0 refills | Status: DC

## 2022-02-12 NOTE — Assessment & Plan Note (Signed)
Stable

## 2022-02-12 NOTE — Assessment & Plan Note (Signed)
Under the care with Dr. Lorella Nimrod  ?Doing better  ?

## 2022-02-12 NOTE — Assessment & Plan Note (Signed)
We will add Norvasc  ?

## 2022-02-12 NOTE — Assessment & Plan Note (Signed)
Continue supplementation  ?

## 2022-02-13 LAB — HIV ANTIBODY (ROUTINE TESTING W REFLEX): HIV 1&2 Ab, 4th Generation: NONREACTIVE

## 2022-02-13 LAB — LIPID PANEL
Cholesterol: 206 mg/dL — ABNORMAL HIGH (ref <200)
HDL: 38 mg/dL — ABNORMAL LOW (ref >=50)
LDL Cholesterol (Calc): 112 mg/dL (calc) — ABNORMAL HIGH
Non-HDL Cholesterol (Calc): 168 mg/dL (calc) — ABNORMAL HIGH (ref <130)
Total CHOL/HDL Ratio: 5.4 (calc) — ABNORMAL HIGH (ref <5.0)
Triglycerides: 390 mg/dL — ABNORMAL HIGH (ref <150)

## 2022-02-13 LAB — BASIC METABOLIC PANEL WITH GFR
BUN: 11 mg/dL (ref 7–25)
CO2: 30 mmol/L (ref 20–32)
Calcium: 9.2 mg/dL (ref 8.6–10.2)
Chloride: 101 mmol/L (ref 98–110)
Creat: 0.97 mg/dL (ref 0.50–0.97)
Glucose, Bld: 86 mg/dL (ref 65–99)
Potassium: 3.7 mmol/L (ref 3.5–5.3)
Sodium: 138 mmol/L (ref 135–146)
eGFR: 77 mL/min/{1.73_m2} (ref >=60)

## 2022-02-13 LAB — MICROALBUMIN / CREATININE URINE RATIO
Creatinine, Urine: 224 mg/dL (ref 20–275)
Microalb Creat Ratio: 8 mcg/mg creat (ref <30)
Microalb, Ur: 1.9 mg/dL

## 2022-02-13 LAB — CERVICOVAGINAL ANCILLARY ONLY
Chlamydia: NEGATIVE
Comment: NEGATIVE
Comment: NORMAL
Neisseria Gonorrhea: POSITIVE — AB

## 2022-02-13 LAB — RPR: RPR Ser Ql: NONREACTIVE

## 2022-02-14 ENCOUNTER — Telehealth: Payer: Self-pay

## 2022-02-14 ENCOUNTER — Ambulatory Visit: Payer: Managed Care, Other (non HMO) | Admitting: Family Medicine

## 2022-02-14 ENCOUNTER — Encounter: Payer: Self-pay | Admitting: Family Medicine

## 2022-02-14 VITALS — BP 148/88 | HR 94 | Temp 98.2°F | Resp 16 | Ht 65.0 in | Wt 185.1 lb

## 2022-02-14 DIAGNOSIS — E785 Hyperlipidemia, unspecified: Secondary | ICD-10-CM | POA: Diagnosis not present

## 2022-02-14 DIAGNOSIS — A549 Gonococcal infection, unspecified: Secondary | ICD-10-CM

## 2022-02-14 MED ORDER — CEFTRIAXONE SODIUM 500 MG IJ SOLR
500.0000 mg | Freq: Once | INTRAMUSCULAR | Status: AC
  Administered 2022-02-14: 500 mg via INTRAMUSCULAR

## 2022-02-14 MED ORDER — FENOFIBRATE 145 MG PO TABS: 145.0000 mg | ORAL_TABLET | Freq: Every day | ORAL | 1 refills | Status: DC

## 2022-02-14 NOTE — Assessment & Plan Note (Signed)
Rx Fenofibrate sent to pharmacy  ?

## 2022-02-14 NOTE — Telephone Encounter (Signed)
Reported by phone and fax communicable disease information as required to [facility].  ?

## 2022-02-14 NOTE — Progress Notes (Signed)
Name: Hannah Gregory   MRN: [id number]    DOB: 1980/12/07   Date:02/14/2022 ? ?     Progress Note ? ?Subjective ? ?Chief Complaint ? ?Chief Complaint  ?Patient presents with  ? Follow-up  ?  Lab results/treatment  ? ? ?HPI ? ?Gonorrhea positive: she has a new sexual partner and already notified him, she is asymptomatic , no vaginal discharge, pelvic pain or pain during intercourse.  ?She is not allergic to any antibiotics  ? ?Discussed importance of rechecking labs and GC in 3 months  ? ?Dyslipidemia: discussed options and she is willing to try it  ? ?Patient Active Problem List  ? Diagnosis Date Noted  ? Pancreatic insufficiency 02/12/2022  ? Dyslipidemia 02/12/2022  ? Right arm numbness 12/21/2021  ? Tobacco use disorder 12/05/2021  ? Labile essential hypertension 12/05/2021  ? Headache disorder 07/31/2021  ? Visual disturbance 07/31/2021  ? 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 B12 deficiency 12/11/2019  ? Vitamin D deficiency 12/11/2019  ? Family history of pernicious anemia 12/11/2019  ? Migraine without aura and without status migrainosus, not intractable 10/14/2019  ? Menometrorrhagia 06/17/2019  ? Chronic daily headache 11/29/2016  ? Neck pain 11/29/2016  ? Scoliosis of thoracolumbar spine 11/29/2016  ? Mild depression 11/29/2016  ? History of anxiety 11/29/2016  ? ? ?Social History  ? ?Tobacco Use  ? Smoking status: Every Day  ?  Packs/day: 0.25  ?  Years: 22.00  ?  Pack years: 5.50  ?  Types: Cigarettes  ?  Start date: 10/01/1997  ? Smokeless tobacco: Never  ?Substance Use Topics  ? Alcohol use: Yes  ?  Alcohol/week: 3.0 standard drinks  ?  Types: 3 Shots of liquor per week  ? ? ? ?Current Outpatient Medications:  ?  amLODipine (NORVASC) 2.5 MG tablet, Take 1 tablet (2.5 mg total) by mouth daily., Disp: 90 tablet, Rfl: 0 ?  ascorbic acid (VITAMIN C) 500 MG tablet, Take 500 mg by mouth daily., Disp: , Rfl:  ?  atenolol-chlorthalidone (TENORETIC) 100-25 MG tablet, Take 1 tablet by mouth daily., Disp: 90 tablet, Rfl:  0 ?  busPIRone (BUSPAR) 5 MG tablet, Take 1 tablet (5 mg total) by mouth 2 (two) times daily as needed., Disp: 60 tablet, Rfl: 0 ?  cholecalciferol (VITAMIN D3) 25 MCG (1000 UNIT) tablet, Take 2,000 Units by mouth daily., Disp: , Rfl:  ?  hydrALAZINE (APRESOLINE) 25 MG tablet, Take 1 tablet (25 mg total) by mouth 3 (three) times daily as needed (BP above 150/90)., Disp: 90 tablet, Rfl: 0 ?  lipase/protease/amylase (CREON) 36000 UNITS CPEP capsule, Take 1 capsule (36,000 Units total) by mouth 3 (three) times daily with meals. May also take 1 capsule (36,000 Units total) as needed (with snacks)., Disp: 240 capsule, Rfl: 3 ?  NURTEC 75 MG TBDP, Take 1 tablet by mouth every other day., Disp: , Rfl:  ?  Ubrogepant 100 MG TABS, Take 1 tablet by mouth daily as needed., Disp: , Rfl:  ?  valACYclovir (VALTREX) 1000 MG tablet, Take 2 tablets every 12 hours for 2 doses total, then stop taking.  Can take in the future if symptoms of cold sores just beginning to help blunt the response (Patient taking differently: as needed (cold sores). Take 2 tablets every 12 hours for 2 doses total, then stop taking.  Can take in the future if symptoms of cold sores just beginning to help blunt the response), Disp: 12 tablet, Rfl: 1 ?  venlafaxine XR (EFFEXOR-XR)  150 MG 24 hr capsule, Take 150 mg by mouth daily., Disp: , Rfl:  ?  venlafaxine XR (EFFEXOR-XR) 75 MG 24 hr capsule, Take 75 mg by mouth daily with breakfast., Disp: , Rfl:  ?  vitamin B-12 (CYANOCOBALAMIN) 1000 MCG tablet, Take 1,000 mcg by mouth daily., Disp: , Rfl:  ?  cyanocobalamin (,VITAMIN B-12,) 1000 MCG/ML injection, INJECT 1ML=1000MCG INTRAMUSCULARLY EVERY MONTH (Patient not taking: Reported on 02/12/2022), Disp: 1 mL, Rfl: 11 ? ?Allergies  ?Allergen Reactions  ? Imitrex [Sumatriptan] Other (See Comments)  ?  Side effect , chest pain, flushed   ? Chantix [Varenicline Tartrate]   ?  nausea  ? Prednisone Hives and Other (See Comments)  ?  Numbness of her face ?  ? Abilify  [Aripiprazole] Nausea Only  ? ? ?ROS ? ?Ten systems reviewed and is negative except as mentioned in HPI  ? ?Objective ? ?Vitals:  ? 02/14/22 0802  ?BP: (!) 148/88  ?Pulse: 94  ?Resp: 16  ?Temp: 98.2 ?F (36.8 ?C)  ?TempSrc: Oral  ?SpO2: 100%  ?Weight: 185 lb 1.6 oz (84 kg)  ?Height: 5\' 5"  (1.651 m)  ? ? ?Body mass index is 30.8 kg/m?. ? ? ? ?Physical Exam ? ?Constitutional: Patient appears well-developed and well-nourished. Obese  No distress.  ?HEENT: head atraumatic, normocephalic, pupils equal and reactive to light,, neck supple ?Cardiovascular: Normal rate, regular rhythm and normal heart sounds.  No murmur heard. No BLE edema. ?Pulmonary/Chest: Effort normal and breath sounds normal. No respiratory distress. ?Abdominal: Soft.  There is no tenderness. ?Psychiatric: Patient has a normal mood and affect. behavior is normal. Judgment and thought content normal.  ? ?Recent Results (from the past 2160 hour(s))  ?B12     Status: None  ? Collection Time: 12/05/21 11:10 AM  ?Result Value Ref Range  ? Vitamin B-12 481 200 - 1,100 pg/mL  ?Lipid panel     Status: Abnormal  ? Collection Time: 12/05/21 11:10 AM  ?Result Value Ref Range  ? Cholesterol 213 (H) <200 mg/dL  ? HDL 49 (L) > OR = 50 mg/dL  ? Triglycerides 537 (H) <150 mg/dL  ?  Comment: . ?If a non-fasting specimen was collected, consider ?repeat triglyceride testing on a fasting specimen ?if clinically indicated.  ?Henrene PastorJacobson et al. J. of Clin. Lipidol. 2015;9:129-169. ?. ?. ?There is increased risk of pancreatitis when the  ?triglyceride concentration is very high  ?(> or = 500 mg/dL, especially if > or = [id number] mg/dL).  ?Henrene PastorJacobson et al. J. of Clin. Lipidol. 2015;9:129-169. ?. ?  ? LDL Cholesterol (Calc)  mg/dL (calc)  ?  Comment: . ?LDL cholesterol not calculated. Triglyceride levels ?greater than 400 mg/dL invalidate calculated LDL results. ?. ?Reference range: <100 ?Marland Kitchen. ?Desirable range <100 mg/dL for primary prevention;   ?<70 mg/dL for patients with CHD or diabetic  patients  ?with > or = 2 CHD risk factors. ?. ?LDL-C is now calculated using the Martin-Hopkins  ?calculation, which is a validated novel method providing  ?better accuracy than the Friedewald equation in the  ?estimation of LDL-C.  ?Horald PollenMartin SS et al. Lenox AhrJAMA. 1914;782(952013;310(19): 2061-2068  ?(http://education.[facility].com/faq/FAQ164) ?  ? Total CHOL/HDL Ratio 4.3 <5.0 (calc)  ? Non-HDL Cholesterol (Calc) 164 (H) <130 mg/dL (calc)  ?  Comment: For patients with diabetes plus 1 major ASCVD risk  ?factor, treating to a non-HDL-C goal of <100 mg/dL  ?(LDL-C of <70 mg/dL) is considered a therapeutic  ?option. ?  ?Comprehensive metabolic panel     Status: Abnormal  ?  Collection Time: 12/05/21 11:10 AM  ?Result Value Ref Range  ? Glucose, Bld 114 (H) 65 - 99 mg/dL  ?  Comment: . ?           Fasting reference interval ?. ?For someone without known diabetes, a glucose value ?between 100 and 125 mg/dL is consistent with ?prediabetes and should be confirmed with a ?follow-up test. ?. ?  ? BUN 13 7 - 25 mg/dL  ? Creat 1.09 (H) 0.50 - 0.97 mg/dL  ? BUN/Creatinine Ratio 12 6 - 22 (calc)  ? Sodium 139 135 - 146 mmol/L  ? Potassium 3.7 3.5 - 5.3 mmol/L  ? Chloride 101 98 - 110 mmol/L  ? CO2 28 20 - 32 mmol/L  ? Calcium 9.6 8.6 - 10.2 mg/dL  ? Total Protein 6.6 6.1 - 8.1 g/dL  ? Albumin 4.2 3.6 - 5.1 g/dL  ? Globulin 2.4 1.9 - 3.7 g/dL (calc)  ? AG Ratio 1.8 1.0 - 2.5 (calc)  ? Total Bilirubin 0.5 0.2 - 1.2 mg/dL  ? Alkaline phosphatase (APISO) 90 31 - 125 U/L  ? AST 13 10 - 30 U/L  ? ALT 13 6 - 29 U/L  ?Hemoglobin A1c     Status: None  ? Collection Time: 12/05/21 11:10 AM  ?Result Value Ref Range  ? Hgb A1c MFr Bld 5.1 <5.7 % of total Hgb  ?  Comment: For the purpose of screening for the presence of ?diabetes: ?. ?<5.7%       Consistent with the absence of diabetes ?5.7-6.4%    Consistent with increased risk for diabetes ?            (prediabetes) ?> or =6.5%  Consistent with diabetes ?Marland Kitchen ?This assay result is consistent with a  decreased risk ?of diabetes. ?. ?Currently, no consensus exists regarding use of ?hemoglobin A1c for diagnosis of diabetes in children. ?. ?According to American Diabetes Association (ADA) ?guidelines, hemogl

## 2022-02-22 ENCOUNTER — Encounter: Payer: Self-pay | Admitting: Family Medicine

## 2022-02-28 ENCOUNTER — Encounter: Payer: Self-pay | Admitting: Family Medicine

## 2022-03-09 ENCOUNTER — Ambulatory Visit (INDEPENDENT_AMBULATORY_CARE_PROVIDER_SITE_OTHER): Payer: Managed Care, Other (non HMO) | Admitting: Family Medicine

## 2022-03-09 ENCOUNTER — Encounter: Payer: Self-pay | Admitting: Family Medicine

## 2022-03-09 VITALS — BP 136/78 | HR 95 | Temp 98.4°F | Resp 16 | Ht 66.0 in | Wt 183.9 lb

## 2022-03-09 DIAGNOSIS — E785 Hyperlipidemia, unspecified: Secondary | ICD-10-CM | POA: Diagnosis not present

## 2022-03-09 DIAGNOSIS — R110 Nausea: Secondary | ICD-10-CM

## 2022-03-09 DIAGNOSIS — R609 Edema, unspecified: Secondary | ICD-10-CM | POA: Diagnosis not present

## 2022-03-09 DIAGNOSIS — T887XXA Unspecified adverse effect of drug or medicament, initial encounter: Secondary | ICD-10-CM | POA: Diagnosis not present

## 2022-03-09 DIAGNOSIS — I10 Essential (primary) hypertension: Secondary | ICD-10-CM

## 2022-03-09 MED ORDER — ONDANSETRON 4 MG PO TBDP: 4.0000 mg | ORAL_TABLET | Freq: Three times a day (TID) | ORAL | 0 refills | Status: DC | PRN

## 2022-03-09 NOTE — Progress Notes (Signed)
Patient ID: Hannah Gregory, female    DOB: 05/04/1981, 41 y.o.   MRN: [id number]  PCP: Steele Sizer, MD  Chief Complaint  Patient presents with   Edema    Bilateral hands and feet w/ soreness   Nausea    Subjective:   Hannah Gregory is a 41 y.o. female, presents to clinic with CC of the following:  Swelling to all extremities and nausea x 2-3 weeks Recent changes with last OV (5/15) have been high cholesterol/trigs with new meds fenofibrate  Also restarted amlodipine for blood pressure about three weeks ago - in the past she took it and she did not have any SE with that She is also on atenolol-chlorthalidone, last OV BP was uncontrolled, better today She is doing low salt and compliant with all the meds BP Readings from Last 3 Encounters:  03/09/22 136/78  02/14/22 (!) 148/88  02/12/22 (!) 150/90   However after starting meds she has been extremely nauseated and she is having swelling to hands and feet/ankles If she looks at food she is nauseous She has had vertigo with nausea in the past and has migraines but no HA's and no vertigo sx She denies abd pain or indigestion She has pancreatic insufficiency - no change in her bowels She talked to Dr. 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   B12 deficiency 12/11/2019   Vitamin D deficiency 12/11/2019   Family history of pernicious anemia 12/11/2019   Migraine without aura and without status migrainosus, not intractable 10/14/2019   Menometrorrhagia 06/17/2019   Chronic daily  headache 11/29/2016   Neck pain 11/29/2016   Scoliosis of thoracolumbar spine 11/29/2016   Mild depression 11/29/2016   History of anxiety 11/29/2016      Current Outpatient Medications:    amLODipine (NORVASC) 2.5 MG tablet, Take 1 tablet (2.5 mg total) by mouth daily., Disp: 90 tablet, Rfl: 0   ascorbic acid (VITAMIN C) 500 MG tablet, Take 500 mg by mouth daily., Disp: , Rfl:    atenolol-chlorthalidone (TENORETIC) 100-25 MG tablet, Take 1 tablet by mouth daily., Disp: 90 tablet, Rfl: 0   busPIRone (BUSPAR) 5 MG tablet, Take 1 tablet (5 mg total) by mouth 2 (two) times daily as needed., Disp: 60 tablet, Rfl: 0   cholecalciferol (VITAMIN D3) 25 MCG (1000 UNIT) tablet, Take 2,000 Units by mouth daily., Disp: , Rfl:    cyanocobalamin (,VITAMIN B-12,) 1000 MCG/ML injection, INJECT 1ML=1000MCG INTRAMUSCULARLY EVERY MONTH, Disp: 1 mL, Rfl: 11   fenofibrate (TRICOR) 145 MG tablet, Take 1 tablet (145 mg total) by mouth daily., Disp: 90 tablet, Rfl: 1   hydrALAZINE (APRESOLINE) 25 MG tablet, Take 1 tablet (25 mg total) by mouth 3 (three) times daily as needed (BP above 150/90)., Disp: 90 tablet, Rfl: 0   lipase/protease/amylase (CREON) 36000 UNITS CPEP capsule, Take 1 capsule (36,000 Units total) by mouth 3 (three) times daily with meals. May also take 1 capsule (36,000 Units total) as needed (with snacks)., Disp: 240 capsule, Rfl: 3   NURTEC  75 MG TBDP, Take 1 tablet by mouth every other day., Disp: , Rfl:    Ubrogepant 100 MG TABS, Take 1 tablet by mouth daily as needed., Disp: , Rfl:    valACYclovir (VALTREX) 1000 MG tablet, Take 2 tablets every 12 hours for 2 doses total, then stop taking.  Can take in the future if symptoms of cold sores just beginning to help blunt the response (Patient taking differently: as needed (cold sores). Take 2 tablets every 12 hours for 2 doses total, then stop taking.  Can take in the future if symptoms of cold sores just beginning to help blunt the response), Disp: 12  tablet, Rfl: 1   venlafaxine XR (EFFEXOR-XR) 150 MG 24 hr capsule, Take 150 mg by mouth daily., Disp: , Rfl:    venlafaxine XR (EFFEXOR-XR) 75 MG 24 hr capsule, Take 75 mg by mouth daily with breakfast., Disp: , Rfl:    vitamin B-12 (CYANOCOBALAMIN) 1000 MCG tablet, Take 1,000 mcg by mouth daily., Disp: , Rfl:    Allergies  Allergen Reactions   Imitrex [Sumatriptan] Other (See Comments)    Side effect , chest pain, flushed    Chantix [Varenicline Tartrate]     nausea   Prednisone Hives and Other (See Comments)    Numbness of her face    Abilify [Aripiprazole] Nausea Only     Social History   Tobacco Use   Smoking status: Every Day    Packs/day: 0.25    Years: 22.00    Total pack years: 5.50    Types: Cigarettes    Start date: 10/01/1997   Smokeless tobacco: Never  Vaping Use   Vaping Use: Never used  Substance Use Topics   Alcohol use: Yes    Alcohol/week: 3.0 standard drinks of alcohol    Types: 3 Shots of liquor per week   Drug use: No      Chart Review Today: I personally reviewed active problem list, medication list, allergies, family history, social history, health maintenance, notes from last encounter, lab results, imaging with the patient/caregiver today.   Review of Systems  Constitutional: Negative.   HENT: Negative.    Eyes: Negative.   Respiratory: Negative.    Cardiovascular: Negative.   Gastrointestinal: Negative.   Endocrine: Negative.   Genitourinary: Negative.   Musculoskeletal: Negative.   Skin: Negative.   Allergic/Immunologic: Negative.   Neurological: Negative.   Hematological: Negative.   Psychiatric/Behavioral: Negative.    All other systems reviewed and are negative.      Objective:   Vitals:   03/09/22 1102  BP: 136/78  Pulse: 95  Resp: 16  Temp: 98.4 F (36.9 C)  SpO2: 98%  Weight: 183 lb 14.4 oz (83.4 kg)  Height: '5\' 6"'  (1.676 m)    Body mass index is 29.68 kg/m.  Physical Exam Vitals and nursing note reviewed.   Constitutional:      General: She is not in acute distress.    Appearance: Normal appearance. She is well-developed, well-groomed and overweight. She is not ill-appearing, toxic-appearing or diaphoretic.  HENT:     Head: Normocephalic and atraumatic.     Nose: Nose normal.  Eyes:     General: No scleral icterus.       Right eye: No discharge.        Left eye: No discharge.     Conjunctiva/sclera: Conjunctivae normal.  Neck:     Trachea: No tracheal deviation.  Cardiovascular:     Rate and Rhythm: Normal rate and  regular rhythm.     Pulses: Normal pulses.     Heart sounds: Normal heart sounds. No murmur heard.    No friction rub. No gallop.  Pulmonary:     Effort: Pulmonary effort is normal. No respiratory distress.     Breath sounds: Normal breath sounds. No stridor.  Abdominal:     General: Bowel sounds are normal.  Musculoskeletal:     Right lower leg: No edema.     Left lower leg: No edema.  Skin:    General: Skin is warm and dry.     Coloration: Skin is not jaundiced or pale.     Findings: No rash.  Neurological:     Mental Status: She is alert.     Motor: No abnormal muscle tone.     Coordination: Coordination normal.  Psychiatric:        Mood and Affect: Mood normal.        Behavior: Behavior normal. Behavior is cooperative.      Results for orders placed or performed in visit on 02/12/22  RPR  Result Value Ref Range   RPR Ser Ql NON-REACTIVE NON-REACTIVE  HIV Antibody (routine testing w rflx)  Result Value Ref Range   HIV 1&2 Ab, 4th Generation NON-REACTIVE NON-REACTIVE  Microalbumin / creatinine urine ratio  Result Value Ref Range   Creatinine, Urine 224 20 - 275 mg/dL   Microalb, Ur 1.9 mg/dL   Microalb Creat Ratio 8 <30 mcg/mg creat  BASIC METABOLIC PANEL WITH GFR  Result Value Ref Range   Glucose, Bld 86 65 - 99 mg/dL   BUN 11 7 - 25 mg/dL   Creat 0.97 0.50 - 0.97 mg/dL   eGFR 77 > OR = 60 mL/min/1.62m   BUN/Creatinine Ratio NOT APPLICABLE 6 -  22 (calc)   Sodium 138 135 - 146 mmol/L   Potassium 3.7 3.5 - 5.3 mmol/L   Chloride 101 98 - 110 mmol/L   CO2 30 20 - 32 mmol/L   Calcium 9.2 8.6 - 10.2 mg/dL  Lipid Profile  Result Value Ref Range   Cholesterol 206 (H) <200 mg/dL   HDL 38 (L) > OR = 50 mg/dL   Triglycerides 390 (H) <150 mg/dL   LDL Cholesterol (Calc) 112 (H) mg/dL (calc)   Total CHOL/HDL Ratio 5.4 (H) <5.0 (calc)   Non-HDL Cholesterol (Calc) 168 (H) <130 mg/dL (calc)  Cervicovaginal ancillary only  Result Value Ref Range   Chlamydia Negative    Neisseria Gonorrhea Positive (A)    Comment      Normal Reference Range Neisseria Gonorrhea - Negative   Comment Normal Reference Ranger Chlamydia - Negative        Assessment & Plan:   1. Peripheral edema Not really noticeable today on exam - she notes to hands/knuckles and feet and ankles She is doing low salt, no change in urine output, she has taken norvasc before and did not have edema as a SE - although its possible it could be causing edema now Will screen thyroid, liver/kidney function Continue low salt diet Increase walking/exercise  - TSH + free T4 - COMPLETE METABOLIC PANEL WITH GFR  2. Medication side effect Most likely SE from fenofibrate - will hold that for the next week and continue norvasc while checking labs - TSH + free T4 - COMPLETE METABOLIC PANEL WITH GFR  3. Nausea No bowel changes, indigestion/reflux, urinary change, abd pain Suspect med SE  Hold fenofibrate Can use zofran prn Can try pepcid prn -  ondansetron (ZOFRAN-ODT) 4 MG disintegrating tablet; Take 1 tablet (4 mg total) by mouth every 8 (eight) hours as needed for nausea or vomiting.  Dispense: 20 tablet; Refill: 0  4. Dyslipidemia Likely familial/genetic Hold fenofibrate for 1-2 weeks to see if SE resolve - she has f/up soon with Dr. Ancil Boozer  Options are to restart fenofibrate if SE don't improve - it may not be from that med Start omega 3 fatty acid supplement Possible statin  - several family members are on it and they do well with it   Reviewed her past labs and PMH and Fhx with her - tx plan going forward will leave to PCP - but I am happy to help in the meantime while she it out  5. Uncontrolled hypertension better today - goal to have <130/80  Recommended continuing DASH, increase exercsie and walking Continue norvasc 2.5 mg with her other med - atenolol-chlorthalidone  BP Readings from Last 3 Encounters:  03/09/22 136/78  02/14/22 (!) 148/88  02/12/22 (!) 150/90    Plan for now - hold fenofibrate - see if sx resolve Treat nausea with zofran/pepcid if needed Check labs for edema  F/up in 2-3 weeks     Delsa Grana, PA-C 03/09/22 11:07 AM

## 2022-03-10 LAB — COMPLETE METABOLIC PANEL WITH GFR
AG Ratio: 1.5 (calc) (ref 1.0–2.5)
ALT: 7 U/L (ref 6–29)
AST: 12 U/L (ref 10–30)
Albumin: 4 g/dL (ref 3.6–5.1)
Alkaline phosphatase (APISO): 80 U/L (ref 31–125)
BUN/Creatinine Ratio: 12 (calc) (ref 6–22)
BUN: 12 mg/dL (ref 7–25)
CO2: 28 mmol/L (ref 20–32)
Calcium: 9.3 mg/dL (ref 8.6–10.2)
Chloride: 104 mmol/L (ref 98–110)
Creat: 0.98 mg/dL — ABNORMAL HIGH (ref 0.50–0.97)
Globulin: 2.6 g/dL (calc) (ref 1.9–3.7)
Glucose, Bld: 110 mg/dL — ABNORMAL HIGH (ref 65–99)
Potassium: 3.8 mmol/L (ref 3.5–5.3)
Sodium: 139 mmol/L (ref 135–146)
Total Bilirubin: 0.5 mg/dL (ref 0.2–1.2)
Total Protein: 6.6 g/dL (ref 6.1–8.1)
eGFR: 76 mL/min/{1.73_m2} (ref >=60)

## 2022-03-10 LAB — TSH+FREE T4: TSH W/REFLEX TO FT4: 2.19 mIU/L

## 2022-03-28 NOTE — Progress Notes (Deleted)
Name: Hannah Gregory   MRN: [id number]    DOB: 07-14-1981   Date:03/28/2022       Progress Note  Subjective  Chief Complaint  Follow Up  HPI  Hypertension Benign: labile hypertension, but lately it has been staying high, lowest in the 130's range, on Atenolol chlorthalidone 100/25 daily, no cramping, potassium has been normal. We will check urine micro and add norvasc if no improvement refer to Nephrologist   Daily headache/Migraine headaches: chronic, she is under the care of neurologist, Dr. Melrose Nakayama and his PA. Taking higher dose of Effexor 225 mg , Nurtec every other day  Ubrelvy prn. She still has daily headaches but not migraines. She just started botox injections   AnxietyMDD  she is on higher dose of Effexor given by neurologist and is feeling better, took only a few of the buspar and seems to work, taking it less than once a week.  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 Marius Ditch and diagnosed with exocrine pancreatic insuffiencey and was given Creon to take before meals. She states abdominal has decreased significantly but still has episodes of diarrhea.   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   B12 deficiency 12/11/2019   Vitamin D  deficiency 12/11/2019   Family history of pernicious anemia 12/11/2019   Migraine without aura and without status migrainosus, not intractable 10/14/2019   Menometrorrhagia 06/17/2019   Chronic daily headache 11/29/2016   Neck pain 11/29/2016   Scoliosis of thoracolumbar spine 11/29/2016   Mild depression 11/29/2016   History of anxiety 11/29/2016    Past Surgical History:  Procedure Laterality Date   GALLBLADDER SURGERY  08/15/2020    Family History  Problem Relation Age of Onset   Asthma Mother    Hypertension Mother    Hyperlipidemia Mother    Thyroid disease Mother    Heart attack Father    Migraines Father    Hypertension Father    Hyperlipidemia Father    Mental retardation Brother    COPD Brother    Hypertension Brother    Emphysema Brother    Breast cancer Maternal Grandmother    Breast cancer Maternal Aunt    Breast cancer Maternal Aunt     Social History   Tobacco Use   Smoking status: Every Day    Packs/day: 0.25    Years: 22.00    Total pack years: 5.50    Types: Cigarettes    Start date: 10/01/1997   Smokeless tobacco: Never  Substance Use Topics   Alcohol use: Yes    Alcohol/week: 3.0 standard drinks of alcohol    Types: 3 Shots of liquor per week     Current Outpatient Medications:    amLODipine (NORVASC) 2.5  MG tablet, Take 1 tablet (2.5 mg total) by mouth daily., Disp: 90 tablet, Rfl: 0   ascorbic acid (VITAMIN C) 500 MG tablet, Take 500 mg by mouth daily., Disp: , Rfl:    atenolol-chlorthalidone (TENORETIC) 100-25 MG tablet, Take 1 tablet by mouth daily., Disp: 90 tablet, Rfl: 0   busPIRone (BUSPAR) 5 MG tablet, Take 1 tablet (5 mg total) by mouth 2 (two) times daily as needed., Disp: 60 tablet, Rfl: 0   cholecalciferol (VITAMIN D3) 25 MCG (1000 UNIT) tablet, Take 2,000 Units by mouth daily., Disp: , Rfl:    cyanocobalamin (,VITAMIN B-12,) 1000 MCG/ML injection, INJECT 1ML=1000MCG INTRAMUSCULARLY EVERY MONTH, Disp: 1 mL, Rfl: 11   fenofibrate  (TRICOR) 145 MG tablet, Take 1 tablet (145 mg total) by mouth daily., Disp: 90 tablet, Rfl: 1   hydrALAZINE (APRESOLINE) 25 MG tablet, Take 1 tablet (25 mg total) by mouth 3 (three) times daily as needed (BP above 150/90)., Disp: 90 tablet, Rfl: 0   lipase/protease/amylase (CREON) 36000 UNITS CPEP capsule, Take 1 capsule (36,000 Units total) by mouth 3 (three) times daily with meals. May also take 1 capsule (36,000 Units total) as needed (with snacks)., Disp: 240 capsule, Rfl: 3   NURTEC 75 MG TBDP, Take 1 tablet by mouth every other day., Disp: , Rfl:    ondansetron (ZOFRAN-ODT) 4 MG disintegrating tablet, Take 1 tablet (4 mg total) by mouth every 8 (eight) hours as needed for nausea or vomiting., Disp: 20 tablet, Rfl: 0   Ubrogepant 100 MG TABS, Take 1 tablet by mouth daily as needed., Disp: , Rfl:    valACYclovir (VALTREX) 1000 MG tablet, Take 2 tablets every 12 hours for 2 doses total, then stop taking.  Can take in the future if symptoms of cold sores just beginning to help blunt the response (Patient taking differently: as needed (cold sores). Take 2 tablets every 12 hours for 2 doses total, then stop taking.  Can take in the future if symptoms of cold sores just beginning to help blunt the response), Disp: 12 tablet, Rfl: 1   venlafaxine XR (EFFEXOR-XR) 150 MG 24 hr capsule, Take 150 mg by mouth daily., Disp: , Rfl:    venlafaxine XR (EFFEXOR-XR) 75 MG 24 hr capsule, Take 75 mg by mouth daily with breakfast., Disp: , Rfl:    vitamin B-12 (CYANOCOBALAMIN) 1000 MCG tablet, Take 1,000 mcg by mouth daily., Disp: , Rfl:   Allergies  Allergen Reactions   Imitrex [Sumatriptan] Other (See Comments)    Side effect , chest pain, flushed    Chantix [Varenicline Tartrate]     nausea   Prednisone Hives and Other (See Comments)    Numbness of her face    Abilify [Aripiprazole] Nausea Only    I personally reviewed active problem list, medication list, allergies, family history, social history, health  maintenance with the patient/caregiver today.   ROS  ***  Objective  There were no vitals filed for this visit.  There is no height or weight on file to calculate BMI.  Physical Exam ***  Recent Results (from the past 2160 hour(s))  Cervicovaginal ancillary only     Status: Abnormal   Collection Time: 02/12/22  8:43 AM  Result Value Ref Range   Chlamydia Negative    Neisseria Gonorrhea Positive (A)    Comment      Normal Reference Range Neisseria Gonorrhea - Negative   Comment Normal Reference Ranger Chlamydia - Negative   RPR     Status: None  Collection Time: 02/12/22  8:48 AM  Result Value Ref Range   RPR Ser Ql NON-REACTIVE NON-REACTIVE  HIV Antibody (routine testing w rflx)     Status: None   Collection Time: 02/12/22  8:48 AM  Result Value Ref Range   HIV 1&2 Ab, 4th Generation NON-REACTIVE NON-REACTIVE    Comment: HIV-1 antigen and HIV-1/HIV-2 antibodies were not detected. There is no laboratory evidence of HIV infection. Marland Kitchen PLEASE NOTE: This information has been disclosed to you from records whose confidentiality may be protected by state law.  If your state requires such protection, then the state law prohibits you from making any further disclosure of the information without the specific written consent of the person to whom it pertains, or as otherwise permitted by law. A general authorization for the release of medical or other information is NOT sufficient for this purpose. . For additional information please refer to http://education.[facility].com/faq/FAQ106 (This link is being provided for informational/ educational purposes only.) . Marland Kitchen The performance of this assay has not been clinically validated in patients less than 32 years old. Marland Kitchen   Microalbumin / creatinine urine ratio     Status: None   Collection Time: 02/12/22  8:48 AM  Result Value Ref Range   Creatinine, Urine 224 20 - 275 mg/dL   Microalb, Ur 1.9 mg/dL    Comment:  Reference Range Not established    Microalb Creat Ratio 8 <30 mcg/mg creat    Comment: . The ADA defines abnormalities in albumin excretion as follows: Marland Kitchen Albuminuria Category        Result (mcg/mg creatinine) . Normal to Mildly increased   <30 Moderately increased         30-299  Severely increased           > OR = 300 . The ADA recommends that at least two of three specimens collected within a 3-6 month period be abnormal before considering a patient to be within a diagnostic category.   BASIC METABOLIC PANEL WITH GFR     Status: None   Collection Time: 02/12/22  8:48 AM  Result Value Ref Range   Glucose, Bld 86 65 - 99 mg/dL    Comment: .            Fasting reference interval .    BUN 11 7 - 25 mg/dL   Creat 0.97 0.50 - 0.97 mg/dL   eGFR 77 > OR = 60 mL/min/1.85m    Comment: The eGFR is based on the CKD-EPI 2021 equation. To calculate  the new eGFR from a previous Creatinine or Cystatin C result, go to https://www.kidney.org/professionals/ kdoqi/gfr%5Fcalculator    BUN/Creatinine Ratio NOT APPLICABLE 6 - 22 (calc)   Sodium 138 135 - 146 mmol/L   Potassium 3.7 3.5 - 5.3 mmol/L   Chloride 101 98 - 110 mmol/L   CO2 30 20 - 32 mmol/L   Calcium 9.2 8.6 - 10.2 mg/dL  Lipid Profile     Status: Abnormal   Collection Time: 02/12/22  8:48 AM  Result Value Ref Range   Cholesterol 206 (H) <200 mg/dL   HDL 38 (L) > OR = 50 mg/dL   Triglycerides 390 (H) <150 mg/dL    Comment: . If a non-fasting specimen was collected, consider repeat triglyceride testing on a fasting specimen if clinically indicated.  JYates Decampet al. J. of Clin. Lipidol. 21937;9:024-097 .Marland Kitchen   LDL Cholesterol (Calc) 112 (H) mg/dL (calc)    Comment: Reference range: <100 . Desirable range <  100 mg/dL for primary prevention;   <70 mg/dL for patients with CHD or diabetic patients  with > or = 2 CHD risk factors. Marland Kitchen LDL-C is now calculated using the Martin-Hopkins  calculation, which is a validated novel  method providing  better accuracy than the Friedewald equation in the  estimation of LDL-C.  Cresenciano Genre et al. Annamaria Helling. 9924;268(34): 2061-2068  (http://education.[facility].com/faq/FAQ164)    Total CHOL/HDL Ratio 5.4 (H) <5.0 (calc)   Non-HDL Cholesterol (Calc) 168 (H) <130 mg/dL (calc)    Comment: For patients with diabetes plus 1 major ASCVD risk  factor, treating to a non-HDL-C goal of <100 mg/dL  (LDL-C of <70 mg/dL) is considered a therapeutic  option.   TSH + free T4     Status: None   Collection Time: 03/09/22 11:28 AM  Result Value Ref Range   TSH W/REFLEX TO FT4 2.19 mIU/L    Comment:           Reference Range .           > or = 20 Years  0.40-4.50 .                Pregnancy Ranges           First trimester    0.26-2.66           Second trimester   0.55-2.73           Third trimester    0.43-2.91   COMPLETE METABOLIC PANEL WITH GFR     Status: Abnormal   Collection Time: 03/09/22 11:28 AM  Result Value Ref Range   Glucose, Bld 110 (H) 65 - 99 mg/dL    Comment: .            Fasting reference interval . For someone without known diabetes, a glucose value between 100 and 125 mg/dL is consistent with prediabetes and should be confirmed with a follow-up test. .    BUN 12 7 - 25 mg/dL   Creat 0.98 (H) 0.50 - 0.97 mg/dL   eGFR 76 > OR = 60 mL/min/1.44m    Comment: The eGFR is based on the CKD-EPI 2021 equation. To calculate  the new eGFR from a previous Creatinine or Cystatin C result, go to https://www.kidney.org/professionals/ kdoqi/gfr%5Fcalculator    BUN/Creatinine Ratio 12 6 - 22 (calc)   Sodium 139 135 - 146 mmol/L   Potassium 3.8 3.5 - 5.3 mmol/L   Chloride 104 98 - 110 mmol/L   CO2 28 20 - 32 mmol/L   Calcium 9.3 8.6 - 10.2 mg/dL   Total Protein 6.6 6.1 - 8.1 g/dL   Albumin 4.0 3.6 - 5.1 g/dL   Globulin 2.6 1.9 - 3.7 g/dL (calc)   AG Ratio 1.5 1.0 - 2.5 (calc)   Total Bilirubin 0.5 0.2 - 1.2 mg/dL   Alkaline phosphatase (APISO) 80 31 - 125 U/L    AST 12 10 - 30 U/L   ALT 7 6 - 29 U/L    PHQ2/9:    03/09/2022   11:05 AM 02/14/2022    8:05 AM 02/12/2022    8:20 AM 12/25/2021    1:21 PM 12/05/2021    9:48 AM  Depression screen PHQ 2/9  Decreased Interest 0 0 0 0 2  Down, Depressed, Hopeless 0 0 0 0 2  PHQ - 2 Score 0 0 0 0 4  Altered sleeping 0 0 0 0 3  Tired, decreased energy 0 0 0 0 3  Change in appetite  0 0 0 0 0  Feeling bad or failure about yourself  0 0 0 0 0  Trouble concentrating 0 0 0 0 0  Moving slowly or fidgety/restless 0 0 0 0 0  Suicidal thoughts 0 0 0 0 0  PHQ-9 Score 0 0 0 0 10  Difficult doing work/chores Not difficult at all   Not difficult at all Somewhat difficult    phq 9 is {gen pos [id number]}   Fall Risk:    03/09/2022   11:05 AM 02/14/2022    8:05 AM 02/12/2022    8:07 AM 12/25/2021    1:21 PM 12/05/2021    9:48 AM  Fall Risk   Falls in the past year? 0 0 0 0 0  Number falls in past yr: 0   0 0  Injury with Fall? 0   0 0  Risk for fall due to : No Fall Risks No Fall Risks No Fall Risks  No Fall Risks  Follow up Falls prevention discussed;Education provided Falls prevention discussed Falls prevention discussed  Falls prevention discussed      Functional Status Survey:      Assessment & Plan  *** There are no diagnoses linked to this encounter.

## 2022-03-29 ENCOUNTER — Ambulatory Visit: Payer: Managed Care, Other (non HMO) | Admitting: Family Medicine

## 2022-04-17 ENCOUNTER — Encounter: Payer: Self-pay | Admitting: Family Medicine

## 2022-05-18 NOTE — Progress Notes (Deleted)
Name: Hannah Gregory   MRN: [id number]    DOB: 1980-11-21   Date:05/18/2022       Progress Note  Subjective  Chief Complaint  Follow Up  HPI  Hypertension Benign: labile hypertension, but lately it has been staying high, lowest in the 130's range, on Atenolol chlorthalidone 100/25 daily, no cramping, potassium has been normal. We will check urine micro and add norvasc if no improvement refer to Nephrologist   Daily headache/Migraine headaches: chronic, she is under the care of neurologist, Dr. Melrose Nakayama and his PA. Taking higher dose of Effexor 225 mg , Nurtec every other day  Ubrelvy prn. She still has daily headaches but not migraines. She just started botox injections   AnxietyMDD  she is on higher dose of Effexor given by neurologist and is feeling better, took only a few of the buspar and seems to work, taking it less than once a week.  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 Marius Ditch and diagnosed with exocrine pancreatic insuffiencey and was given Creon to take before meals. She states abdominal has decreased significantly but still has episodes of diarrhea.   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   B12 deficiency 12/11/2019   Vitamin D deficiency 12/11/2019   Family history of pernicious anemia 12/11/2019   Migraine without  aura and without status migrainosus, not intractable 10/14/2019   Menometrorrhagia 06/17/2019   Chronic daily headache 11/29/2016   Neck pain 11/29/2016   Scoliosis of thoracolumbar spine 11/29/2016   Mild depression 11/29/2016   History of anxiety 11/29/2016    Past Surgical History:  Procedure Laterality Date   GALLBLADDER SURGERY  08/15/2020    Family History  Problem Relation Age of Onset   Asthma Mother    Hypertension Mother    Hyperlipidemia Mother    Thyroid disease Mother    Heart attack Father    Migraines Father    Hypertension Father    Hyperlipidemia Father    Mental retardation Brother    COPD Brother    Hypertension Brother    Emphysema Brother    Breast cancer Maternal Grandmother    Breast cancer Maternal Aunt    Breast cancer Maternal Aunt     Social History   Tobacco Use   Smoking status: Every Day    Packs/day: 0.25    Years: 22.00    Total pack years: 5.50    Types: Cigarettes    Start date: 10/01/1997   Smokeless tobacco: Never  Substance Use Topics   Alcohol use: Yes    Alcohol/week: 3.0 standard drinks of alcohol    Types: 3 Shots of liquor per week     Current Outpatient Medications:    amLODipine (NORVASC) 2.5 MG tablet, Take 1 tablet (2.5 mg total) by mouth daily., Disp: 90 tablet, Rfl: 0   ascorbic acid (  VITAMIN C) 500 MG tablet, Take 500 mg by mouth daily., Disp: , Rfl:    atenolol-chlorthalidone (TENORETIC) 100-25 MG tablet, Take 1 tablet by mouth daily., Disp: 90 tablet, Rfl: 0   busPIRone (BUSPAR) 5 MG tablet, Take 1 tablet (5 mg total) by mouth 2 (two) times daily as needed., Disp: 60 tablet, Rfl: 0   cholecalciferol (VITAMIN D3) 25 MCG (1000 UNIT) tablet, Take 2,000 Units by mouth daily., Disp: , Rfl:    cyanocobalamin (,VITAMIN B-12,) 1000 MCG/ML injection, INJECT 1ML=1000MCG INTRAMUSCULARLY EVERY MONTH, Disp: 1 mL, Rfl: 11   fenofibrate (TRICOR) 145 MG tablet, Take 1 tablet (145 mg total) by mouth daily., Disp: 90 tablet, Rfl:  1   hydrALAZINE (APRESOLINE) 25 MG tablet, Take 1 tablet (25 mg total) by mouth 3 (three) times daily as needed (BP above 150/90)., Disp: 90 tablet, Rfl: 0   lipase/protease/amylase (CREON) 36000 UNITS CPEP capsule, Take 1 capsule (36,000 Units total) by mouth 3 (three) times daily with meals. May also take 1 capsule (36,000 Units total) as needed (with snacks)., Disp: 240 capsule, Rfl: 3   NURTEC 75 MG TBDP, Take 1 tablet by mouth every other day., Disp: , Rfl:    ondansetron (ZOFRAN-ODT) 4 MG disintegrating tablet, Take 1 tablet (4 mg total) by mouth every 8 (eight) hours as needed for nausea or vomiting., Disp: 20 tablet, Rfl: 0   Ubrogepant 100 MG TABS, Take 1 tablet by mouth daily as needed., Disp: , Rfl:    valACYclovir (VALTREX) 1000 MG tablet, Take 2 tablets every 12 hours for 2 doses total, then stop taking.  Can take in the future if symptoms of cold sores just beginning to help blunt the response (Patient taking differently: as needed (cold sores). Take 2 tablets every 12 hours for 2 doses total, then stop taking.  Can take in the future if symptoms of cold sores just beginning to help blunt the response), Disp: 12 tablet, Rfl: 1   venlafaxine XR (EFFEXOR-XR) 150 MG 24 hr capsule, Take 150 mg by mouth daily., Disp: , Rfl:    venlafaxine XR (EFFEXOR-XR) 75 MG 24 hr capsule, Take 75 mg by mouth daily with breakfast., Disp: , Rfl:    vitamin B-12 (CYANOCOBALAMIN) 1000 MCG tablet, Take 1,000 mcg by mouth daily., Disp: , Rfl:   Allergies  Allergen Reactions   Imitrex [Sumatriptan] Other (See Comments)    Side effect , chest pain, flushed    Chantix [Varenicline Tartrate]     nausea   Prednisone Hives and Other (See Comments)    Numbness of her face    Abilify [Aripiprazole] Nausea Only    I personally reviewed active problem list, medication list, allergies, family history, social history, health maintenance with the patient/caregiver today.   ROS  ***  Objective  There were no  vitals filed for this visit.  There is no height or weight on file to calculate BMI.  Physical Exam ***  Recent Results (from the past 2160 hour(s))  TSH + free T4     Status: None   Collection Time: 03/09/22 11:28 AM  Result Value Ref Range   TSH W/REFLEX TO FT4 2.19 mIU/L    Comment:           Reference Range .           > or = 20 Years  0.40-4.50 .                Pregnancy Ranges  First trimester    0.26-2.66           Second trimester   0.55-2.73           Third trimester    0.43-2.91   COMPLETE METABOLIC PANEL WITH GFR     Status: Abnormal   Collection Time: 03/09/22 11:28 AM  Result Value Ref Range   Glucose, Bld 110 (H) 65 - 99 mg/dL    Comment: .            Fasting reference interval . For someone without known diabetes, a glucose value between 100 and 125 mg/dL is consistent with prediabetes and should be confirmed with a follow-up test. .    BUN 12 7 - 25 mg/dL   Creat 0.98 (H) 0.50 - 0.97 mg/dL   eGFR 76 > OR = 60 mL/min/1.83m    Comment: The eGFR is based on the CKD-EPI 2021 equation. To calculate  the new eGFR from a previous Creatinine or Cystatin C result, go to https://www.kidney.org/professionals/ kdoqi/gfr%5Fcalculator    BUN/Creatinine Ratio 12 6 - 22 (calc)   Sodium 139 135 - 146 mmol/L   Potassium 3.8 3.5 - 5.3 mmol/L   Chloride 104 98 - 110 mmol/L   CO2 28 20 - 32 mmol/L   Calcium 9.3 8.6 - 10.2 mg/dL   Total Protein 6.6 6.1 - 8.1 g/dL   Albumin 4.0 3.6 - 5.1 g/dL   Globulin 2.6 1.9 - 3.7 g/dL (calc)   AG Ratio 1.5 1.0 - 2.5 (calc)   Total Bilirubin 0.5 0.2 - 1.2 mg/dL   Alkaline phosphatase (APISO) 80 31 - 125 U/L   AST 12 10 - 30 U/L   ALT 7 6 - 29 U/L    PHQ2/9:    03/09/2022   11:05 AM 02/14/2022    8:05 AM 02/12/2022    8:20 AM 12/25/2021    1:21 PM 12/05/2021    9:48 AM  Depression screen PHQ 2/9  Decreased Interest 0 0 0 0 2  Down, Depressed, Hopeless 0 0 0 0 2  PHQ - 2 Score 0 0 0 0 4  Altered sleeping 0 0 0 0 3   Tired, decreased energy 0 0 0 0 3  Change in appetite 0 0 0 0 0  Feeling bad or failure about yourself  0 0 0 0 0  Trouble concentrating 0 0 0 0 0  Moving slowly or fidgety/restless 0 0 0 0 0  Suicidal thoughts 0 0 0 0 0  PHQ-9 Score 0 0 0 0 10  Difficult doing work/chores Not difficult at all   Not difficult at all Somewhat difficult    phq 9 is {gen pos [id number]}  Fall Risk:    03/09/2022   11:05 AM 02/14/2022    8:05 AM 02/12/2022    8:07 AM 12/25/2021    1:21 PM 12/05/2021    9:48 AM  Fall Risk   Falls in the past year? 0 0 0 0 0  Number falls in past yr: 0   0 0  Injury with Fall? 0   0 0  Risk for fall due to : No Fall Risks No Fall Risks No Fall Risks  No Fall Risks  Follow up Falls prevention discussed;Education provided Falls prevention discussed Falls prevention discussed  Falls prevention discussed      Functional Status Survey:      Assessment & Plan  *** There are no diagnoses linked to this encounter.

## 2022-05-21 ENCOUNTER — Ambulatory Visit: Payer: Managed Care, Other (non HMO) | Admitting: Family Medicine

## 2022-06-25 ENCOUNTER — Telehealth: Payer: Self-pay

## 2022-06-25 ENCOUNTER — Ambulatory Visit: Payer: Managed Care, Other (non HMO) | Admitting: Family Medicine

## 2022-06-25 ENCOUNTER — Encounter: Payer: Self-pay | Admitting: Family Medicine

## 2022-06-25 VITALS — BP 134/78 | HR 92 | Temp 98.0°F | Resp 16 | Ht 66.0 in | Wt 179.8 lb

## 2022-06-25 DIAGNOSIS — N912 Amenorrhea, unspecified: Secondary | ICD-10-CM | POA: Diagnosis not present

## 2022-06-25 DIAGNOSIS — Z3201 Encounter for pregnancy test, result positive: Secondary | ICD-10-CM

## 2022-06-25 LAB — POCT URINE PREGNANCY: Preg Test, Ur: POSITIVE — AB

## 2022-06-25 NOTE — Telephone Encounter (Signed)
Pt verbalized understanding.

## 2022-06-25 NOTE — Telephone Encounter (Signed)
Pt had a visit with Leisa today. Pt insists to know if you would be willing to prescribe a medication to help abort. Please advice.

## 2022-06-25 NOTE — Progress Notes (Signed)
Patient ID: Hannah Gregory, female    DOB: [birth year], 41 y.o.   MRN: [id number]  PCP: Alba Cory, MD  Chief Complaint  Patient presents with   Nausea   Amenorrhea    Pt missed menstrual cycle this month, she states has regular cycles. Has done 4 pregnancy test at home POSITIVE    Subjective:   Hannah Gregory is a 41 y.o. female, presents to clinic with CC of the following:  HPI    LMP 05/18/22 Not on birth control due to being high risk with age/BP and cannot tolerate with severe migraines Results for orders placed or performed in visit on 06/25/22  POCT urine pregnancy  Result Value Ref Range   Preg Test, Ur Positive (A) Negative    EDD 02/23/23 Nauseous, no other sx     Patient Active Problem List   Diagnosis Date Noted   Pancreatic insufficiency 02/12/2022   Dyslipidemia 02/12/2022   Right arm numbness 12/21/2021   Tobacco use disorder 12/05/2021   Labile essential hypertension 12/05/2021   Headache disorder 07/31/2021   Visual disturbance 07/31/2021   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   B12 deficiency 12/11/2019   Vitamin D deficiency 12/11/2019   Family history of pernicious anemia 12/11/2019   Migraine without aura and without status migrainosus, not intractable 10/14/2019   Menometrorrhagia 06/17/2019   Chronic daily headache 11/29/2016   Neck pain 11/29/2016   Scoliosis of thoracolumbar spine 11/29/2016   Mild depression 11/29/2016   History of anxiety 11/29/2016      Current Outpatient Medications:    amLODipine (NORVASC) 2.5 MG tablet, Take 1 tablet (2.5 mg total) by mouth daily., Disp: 90 tablet, Rfl: 0   ascorbic acid (VITAMIN C) 500 MG tablet, Take 500 mg by mouth daily., Disp: , Rfl:    atenolol-chlorthalidone (TENORETIC) 100-25 MG tablet, Take 1 tablet by mouth daily., Disp: 90 tablet, Rfl: 0   busPIRone (BUSPAR) 5 MG tablet, Take 1 tablet (5 mg total) by mouth 2 (two) times daily as needed., Disp: 60 tablet, Rfl: 0   cholecalciferol  (VITAMIN D3) 25 MCG (1000 UNIT) tablet, Take 2,000 Units by mouth daily., Disp: , Rfl:    cyanocobalamin (,VITAMIN B-12,) 1000 MCG/ML injection, INJECT 1ML=1000MCG INTRAMUSCULARLY EVERY MONTH, Disp: 1 mL, Rfl: 11   hydrALAZINE (APRESOLINE) 25 MG tablet, Take 1 tablet (25 mg total) by mouth 3 (three) times daily as needed (BP above 150/90)., Disp: 90 tablet, Rfl: 0   ondansetron (ZOFRAN-ODT) 4 MG disintegrating tablet, Take 1 tablet (4 mg total) by mouth every 8 (eight) hours as needed for nausea or vomiting., Disp: 20 tablet, Rfl: 0   valACYclovir (VALTREX) 1000 MG tablet, Take 2 tablets every 12 hours for 2 doses total, then stop taking.  Can take in the future if symptoms of cold sores just beginning to help blunt the response (Patient taking differently: as needed (cold sores). Take 2 tablets every 12 hours for 2 doses total, then stop taking.  Can take in the future if symptoms of cold sores just beginning to help blunt the response), Disp: 12 tablet, Rfl: 1   venlafaxine XR (EFFEXOR-XR) 150 MG 24 hr capsule, Take 150 mg by mouth daily., Disp: , Rfl:    venlafaxine XR (EFFEXOR-XR) 75 MG 24 hr capsule, Take 75 mg by mouth daily with breakfast., Disp: , Rfl:    vitamin B-12 (CYANOCOBALAMIN) 1000 MCG tablet, Take 1,000 mcg by mouth daily., Disp: , Rfl:    fenofibrate (TRICOR) 145 MG  tablet, Take 1 tablet (145 mg total) by mouth daily. (Patient not taking: Reported on 06/25/2022), Disp: 90 tablet, Rfl: 1   lipase/protease/amylase (CREON) 36000 UNITS CPEP capsule, Take 1 capsule (36,000 Units total) by mouth 3 (three) times daily with meals. May also take 1 capsule (36,000 Units total) as needed (with snacks). (Patient not taking: Reported on 06/25/2022), Disp: 240 capsule, Rfl: 3   NURTEC 75 MG TBDP, Take 1 tablet by mouth every other day. (Patient not taking: Reported on 06/25/2022), Disp: , Rfl:    Ubrogepant 100 MG TABS, Take 1 tablet by mouth daily as needed. (Patient not taking: Reported on 06/25/2022),  Disp: , Rfl:    Allergies  Allergen Reactions   Imitrex [Sumatriptan] Other (See Comments)    Side effect , chest pain, flushed    Chantix [Varenicline Tartrate]     nausea   Prednisone Hives and Other (See Comments)    Numbness of her face    Abilify [Aripiprazole] Nausea Only     Social History   Tobacco Use   Smoking status: Every Day    Packs/day: 0.25    Years: 22.00    Total pack years: 5.50    Types: Cigarettes    Start date: 10/01/1997   Smokeless tobacco: Never  Vaping Use   Vaping Use: Never used  Substance Use Topics   Alcohol use: Yes    Alcohol/week: 3.0 standard drinks of alcohol    Types: 3 Shots of liquor per week   Drug use: No      Chart Review Today: I personally reviewed active problem list, medication list, allergies, family history, social history, health maintenance, notes from last encounter, lab results, imaging with the patient/caregiver today.   Review of Systems  Constitutional: Negative.   HENT: Negative.    Eyes: Negative.   Respiratory: Negative.    Cardiovascular: Negative.   Gastrointestinal: Negative.   Endocrine: Negative.   Genitourinary: Negative.   Musculoskeletal: Negative.   Skin: Negative.   Allergic/Immunologic: Negative.   Neurological: Negative.   Hematological: Negative.   Psychiatric/Behavioral: Negative.    All other systems reviewed and are negative.      Objective:   Vitals:   06/25/22 1111  BP: 134/78  Pulse: 92  Resp: 16  Temp: 98 F (36.7 C)  TempSrc: Oral  SpO2: 98%  Weight: 179 lb 12.8 oz (81.6 kg)  Height: 5\' 6"  (1.676 m)    Body mass index is 29.02 kg/m.  Physical Exam Vitals and nursing note reviewed.  Constitutional:      Appearance: She is well-developed.  HENT:     Head: Normocephalic and atraumatic.     Nose: Nose normal.  Eyes:     General:        Right eye: No discharge.        Left eye: No discharge.     Conjunctiva/sclera: Conjunctivae normal.  Neck:     Trachea: No  tracheal deviation.  Cardiovascular:     Rate and Rhythm: Normal rate and regular rhythm.  Pulmonary:     Effort: Pulmonary effort is normal. No respiratory distress.     Breath sounds: No stridor.  Musculoskeletal:        General: Normal range of motion.  Skin:    General: Skin is warm and dry.     Findings: No rash.  Neurological:     Mental Status: She is alert.     Motor: No abnormal muscle tone.     Coordination:  Coordination normal.  Psychiatric:        Behavior: Behavior normal.      Results for orders placed or performed in visit on 06/25/22  POCT urine pregnancy  Result Value Ref Range   Preg Test, Ur Positive (A) Negative       Assessment & Plan:   1. Amenorrhea - POCT urine pregnancy  2. Positive pregnancy test Results for orders placed or performed in visit on 06/25/22  POCT urine pregnancy  Result Value Ref Range   Preg Test, Ur Positive (A) Negative  Discussed local options for OBGYN, supplements for 1st trimester, indications to go to ED or OB, pt verbalized understanding      Danelle Berry, PA-C 06/25/22 11:26 AM

## 2022-07-10 ENCOUNTER — Ambulatory Visit: Payer: Managed Care, Other (non HMO) | Admitting: Internal Medicine

## 2022-07-10 ENCOUNTER — Encounter: Payer: Self-pay | Admitting: Internal Medicine

## 2022-07-10 ENCOUNTER — Ambulatory Visit: Payer: Self-pay

## 2022-07-10 VITALS — BP 122/68 | HR 121 | Temp 98.2°F | Resp 16 | Ht 66.0 in | Wt 178.4 lb

## 2022-07-10 DIAGNOSIS — R059 Cough, unspecified: Secondary | ICD-10-CM | POA: Diagnosis not present

## 2022-07-10 DIAGNOSIS — R519 Headache, unspecified: Secondary | ICD-10-CM

## 2022-07-10 DIAGNOSIS — B001 Herpesviral vesicular dermatitis: Secondary | ICD-10-CM | POA: Diagnosis not present

## 2022-07-10 DIAGNOSIS — J069 Acute upper respiratory infection, unspecified: Secondary | ICD-10-CM | POA: Diagnosis not present

## 2022-07-10 LAB — POCT INFLUENZA A/B
Influenza A, POC: NEGATIVE
Influenza B, POC: NEGATIVE

## 2022-07-10 MED ORDER — FLUTICASONE PROPIONATE 50 MCG/ACT NA SUSP: 2.0000 | Freq: Every day | NASAL | 6 refills | Status: DC

## 2022-07-10 MED ORDER — METHYLPREDNISOLONE 4 MG PO TBPK: ORAL_TABLET | ORAL | 0 refills | Status: DC

## 2022-07-10 MED ORDER — VALACYCLOVIR HCL 1 G PO TABS: 500.0000 mg | ORAL_TABLET | Freq: Every day | ORAL | 1 refills | Status: AC

## 2022-07-10 MED ORDER — BENZONATATE 100 MG PO CAPS: 100.0000 mg | ORAL_CAPSULE | Freq: Two times a day (BID) | ORAL | 0 refills | Status: DC | PRN

## 2022-07-10 MED ORDER — GUAIFENESIN ER 600 MG PO TB12: 600.0000 mg | ORAL_TABLET | Freq: Two times a day (BID) | ORAL | 0 refills | Status: DC

## 2022-07-10 NOTE — Patient Instructions (Signed)
It was great seeing you today!  Plan discussed at today's visit: -Complete antibiotic -Take steroid pack  -Use nasal spray 2 sprays twice a day, can also use nasal saline -Mucinex in the morning and cough suppressant at night   Follow up in: as needed   Take care and let us know if you have any questions or concerns prior to your next visit.  Dr. Rosana Berger

## 2022-07-10 NOTE — Telephone Encounter (Signed)
  Chief Complaint: facial pain Symptoms: nasal discharge (white), sore throat, cough (clear), SOB after severe coughing episode, ear pressure, chest congestion Frequency: last Thursday Pertinent Negatives: Patient denies fever, clicking sensation to jaw, wheezing Disposition: [] ED /[] Urgent Care (no appt availability in office) / [x] Appointment(In office/virtual)/ []  [facility]/ [] Home Care/ [] Refused Recommended Disposition /[] [facility]/ []  Follow-up with PCP Additional Notes: n/a Reason for Disposition  [1] MODERATE pain (e.g., interferes with normal activities) AND [2] constant AND [3] present > 24 hours  Answer Assessment - Initial Assessment Questions 1. ONSET: "When did the pain start?" (e.g., minutes, hours, days)     Last Thursday 2. ONSET: "Does the pain come and go, or has it been constant since it started?" (e.g., constant, intermittent, fleeting)     constant 3. SEVERITY: "How bad is the pain?"   (Scale 1-10; mild, moderate or severe)   - MILD (1-3): doesn't interfere with normal activities    - MODERATE (4-7): interferes with normal activities or awakens from sleep    - SEVERE (8-10): excruciating pain, unable to do any normal activities      moderate 4. LOCATION: "Where does it hurt?"      Face around nose and to forehead 5. RASH: "Is there any redness, rash, or swelling of the face?"     no 6. FEVER: "Do you have a fever?" If Yes, ask: "What is it, how was it measured, and when did it start?"      no 7. OTHER SYMPTOMS: "Do you have any other symptoms?" (e.g., fever, toothache, nasal discharge, nasal congestion, clicking sensation in jaw joint)     Muffled hearing, sore throat, ear pressure, SOB cough (phlegm clear), nasal congestion, nasal discharge (white) 8. PREGNANCY: "Is there any chance you are pregnant?" "When was your last menstrual period?"     N/a  Protocols used: Face Pain-A-AH

## 2022-07-10 NOTE — Progress Notes (Signed)
Acute Office Visit  Subjective:     Patient ID: Hannah Gregory, female    DOB: [birth year], 41 y.o.   MRN: [id number]  Chief Complaint  Patient presents with   Sinusitis    Martin Majestic to urgent care last Friday currently on antibiotic, not helping and had negative covid/flu test. Symptoms include headache, cough, sob, ear pressure    HPI Patient is in today for sinus pain. Symptoms started about 5 days ago.  She then was seen in urgent care on Friday, 07/06/2022 where she was diagnosed with sinus infection and tonsillitis.  COVID test, flu test and strep negative at that time.  She was treated with Augmentin, which she has been compliant with.  She does endorse some mild diarrhea associated with the antibiotic.  She did not have a chest x-ray.  She states since she was seen at urgent care her symptoms have been worsening.   URI Compliant:  -Worst symptom: congestion  -Fever: no, chills  -Cough: yes, productive  -Shortness of breath: yes -Wheezing: no -Chest pain: no -Chest tightness: yes -Chest congestion: yes -Nasal congestion: yes -Runny nose: yes -Post nasal drip: yes -Sore throat: yes -Swollen glands: no -Sinus pressure: yes -Headache: no -Ear pain: yes left -Ear pressure: yes bilateral -Watery discharge from the ear but denies hearing changes or tinnitus. -Vomiting: no -Fatigue: yes -Sick contacts: yes - 2 people with COVID at work  -Context: worse -Relief with OTC cold/cough medications: no  -Treatments attempted: tylenol, Corcidin, Dayquil/Nightquil    Review of Systems  Constitutional:  Positive for chills and malaise/fatigue. Negative for fever.  HENT:  Positive for congestion, ear discharge, ear pain, sinus pain and sore throat. Negative for hearing loss and tinnitus.   Respiratory:  Positive for cough, sputum production and shortness of breath. Negative for wheezing.   Cardiovascular:  Negative for chest pain.  Gastrointestinal:  Positive for diarrhea. Negative  for abdominal pain, nausea and vomiting.  Neurological:  Positive for headaches.        Objective:    BP 122/68   Pulse (!) 121   Temp 98.2 F (36.8 C)   Resp 16   Ht 5\' 6"  (1.676 m)   Wt 178 lb 6.4 oz (80.9 kg)   LMP 05/18/2022 (Exact Date)   SpO2 98%   BMI 28.79 kg/m  BP Readings from Last 3 Encounters:  07/10/22 122/68  06/25/22 134/78  03/09/22 136/78   Wt Readings from Last 3 Encounters:  07/10/22 178 lb 6.4 oz (80.9 kg)  06/25/22 179 lb 12.8 oz (81.6 kg)  03/09/22 183 lb 14.4 oz (83.4 kg)      Physical Exam Constitutional:      Appearance: Normal appearance. She is ill-appearing.  HENT:     Head: Normocephalic and atraumatic.     Right Ear: Tympanic membrane, ear canal and external ear normal.     Left Ear: Tympanic membrane, ear canal and external ear normal.     Ears:     Comments: Bilateral eustachian tube dysfunction present.    Nose: Congestion present.     Mouth/Throat:     Mouth: Mucous membranes are moist.     Pharynx: Posterior oropharyngeal erythema present.     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  Cardiovascular:     Rate and Rhythm: Regular rhythm. Tachycardia present.  Pulmonary:     Effort: Pulmonary effort is normal.     Breath sounds: Normal  breath sounds.  Lymphadenopathy:     Cervical: No cervical adenopathy.  Skin:    General: Skin is warm and dry.  Neurological:     General: No focal deficit present.     Mental Status: She is alert. Mental status is at baseline.  Psychiatric:        Mood and Affect: Mood normal.        Behavior: Behavior normal.     No results found for any visits on 07/10/22.      Assessment & Plan:   1. Upper respiratory tract infection, unspecified type/Nonintractable headache, unspecified chronicity pattern, unspecified headache type/Cough, unspecified type: COVID, flu, strep negative at that time. She was retested today with send out  test. Patient also states she had a positive pregnancy test but she has an appointment to terminate the pregnancy on Saturday. We will wait until after her procedure for chest x-ray if her symptoms are still present.  Continue Augmentin, treat with Medrol Dosepak, nasal steroids, Mucinex in the morning and cough suppressant at night.  These medications were sent to her pharmacy.  She will follow-up here if her symptoms worsen or fail to improve.  - methylPREDNISolone (MEDROL DOSEPAK) 4 MG TBPK tablet; Day 1: Take 8 mg (2 tablets) before breakfast, 4 mg (1 tablet) after lunch, 4 mg (1 tablet) after supper, and 8 mg (2 tablets) at bedtime. Day 2:Take 4 mg (1 tablet) before breakfast, 4 mg (1 tablet) after lunch, 4 mg (1 tablet) after supper, and 8 mg (2 tablets) at bedtime. Day 3: Take 4 mg (1 tablet) before breakfast, 4 mg (1 tablet) after lunch, 4 mg (1 tablet) after supper, and 4 mg (1 tablet) at bedtime. Day 4: Take 4 mg (1 tablet) before breakfast, 4 mg (1 tablet) after lunch, and 4 mg (1 tablet) at bedtime. Day 5: Take 4 mg (1 tablet) before breakfast and 4 mg (1 tablet) at bedtime. Day 6: Take 4 mg (1 tablet) before breakfast.  Dispense: 1 each; Refill: 0 - fluticasone (FLONASE) 50 MCG/ACT nasal spray; Place 2 sprays into both nostrils daily.  Dispense: 16 g; Refill: 6 - guaiFENesin (MUCINEX) 600 MG 12 hr tablet; Take 1 tablet (600 mg total) by mouth 2 (two) times daily.  Dispense: 30 tablet; Refill: 0 - benzonatate (TESSALON) 100 MG capsule; Take 1 capsule (100 mg total) by mouth 2 (two) times daily as needed for cough.  Dispense: 20 capsule; Refill: 0 - POCT Influenza A/B - Novel Coronavirus, NAA ([facility])  2. Recurrent cold sores: Refill Valtrex today.   - valACYclovir (VALTREX) 1000 MG tablet; Take 0.5 tablets (500 mg total) by mouth daily. Take 2 tablets every 12 hours for 2 doses total, then stop taking.  Can take in the future if symptoms of cold sores just beginning to help blunt the response   Dispense: 90 tablet; Refill: 1   Return if symptoms worsen or fail to improve.  Margarita Mail, DO

## 2022-07-11 LAB — NOVEL CORONAVIRUS, NAA: SARS-CoV-2, NAA: NOT DETECTED

## 2022-07-11 LAB — SPECIMEN STATUS REPORT

## 2022-07-17 ENCOUNTER — Telehealth: Payer: Self-pay | Admitting: Family Medicine

## 2022-07-17 ENCOUNTER — Ambulatory Visit
Admission: RE | Admit: 2022-07-17 | Discharge: 2022-07-17 | Disposition: A | Discharge status: Home / Self Care | Payer: Managed Care, Other (non HMO) | Source: Ambulatory Visit | Attending: Internal Medicine | Admitting: Internal Medicine

## 2022-07-17 ENCOUNTER — Ambulatory Visit
Admission: RE | Admit: 2022-07-17 | Discharge: 2022-07-17 | Disposition: A | Discharge status: Home / Self Care | Payer: Managed Care, Other (non HMO) | Attending: Internal Medicine | Admitting: Internal Medicine

## 2022-07-17 ENCOUNTER — Other Ambulatory Visit: Payer: Self-pay | Admitting: Internal Medicine

## 2022-07-17 ENCOUNTER — Encounter: Payer: Self-pay | Admitting: Internal Medicine

## 2022-07-17 DIAGNOSIS — J069 Acute upper respiratory infection, unspecified: Secondary | ICD-10-CM | POA: Insufficient documentation

## 2022-07-17 DIAGNOSIS — R059 Cough, unspecified: Secondary | ICD-10-CM

## 2022-07-17 MED ORDER — BUDESONIDE-FORMOTEROL FUMARATE 160-4.5 MCG/ACT IN AERO: 2.0000 | INHALATION_SPRAY | Freq: Two times a day (BID) | RESPIRATORY_TRACT | 3 refills | Status: DC

## 2022-07-17 NOTE — Telephone Encounter (Signed)
Pt.notified

## 2022-07-17 NOTE — Telephone Encounter (Signed)
Pt did xray today and states she is needing something to help her breathe. All the medication that she was taking fis complete but she did pick up some more mucinex. Please send to [facility]

## 2022-07-19 ENCOUNTER — Ambulatory Visit: Payer: Managed Care, Other (non HMO) | Admitting: Internal Medicine

## 2022-07-19 ENCOUNTER — Encounter: Payer: Self-pay | Admitting: Internal Medicine

## 2022-07-19 VITALS — BP 118/84 | HR 83 | Temp 97.9°F | Resp 16 | Ht 66.0 in | Wt 177.9 lb

## 2022-07-19 DIAGNOSIS — J069 Acute upper respiratory infection, unspecified: Secondary | ICD-10-CM | POA: Diagnosis not present

## 2022-07-19 DIAGNOSIS — R059 Cough, unspecified: Secondary | ICD-10-CM | POA: Diagnosis not present

## 2022-07-19 MED ORDER — PREDNISONE 10 MG PO TABS: ORAL_TABLET | ORAL | 0 refills | Status: AC

## 2022-07-19 NOTE — Progress Notes (Signed)
Acute Office Visit  Subjective:     Patient ID: Hannah Gregory, female    DOB: 06-Mar-1981, 41 y.o.   MRN: [id number]  Chief Complaint  Patient presents with   URI    Still not feeling good     HPI Patient is in today for follow up on URI symptoms. She was seen in the office on 10/10 - at that time symptoms had been going on for 5 days. She was seen in urgent care on Friday, 07/06/2022 where she was diagnosed with sinus infection and tonsillitis.  COVID test, flu test and strep negative at that time.  She was treated with Augmentin, which she has been compliant with and finished.  At our visit she was given steroid pack. Chest x-ray negative. Today she states that she still is not feeling well and she is still coughing intensely. Her symptoms got a little better with the Medrol but return immediately after she finished it.   URI Compliant:  -Worst symptom: cough - productive chunky and yellow/brown. Congestion  -Fever: no -Cough: yes, productive  -Shortness of breath: yes -Wheezing: no -Chest pain: no -Chest tightness: yes -Chest congestion: yes -Nasal congestion: yes -Runny nose: yes -Post nasal drip: yes -Sore throat: yes -Swollen glands: no -Sinus pressure: no -Headache: no -Ear pain: no, better  -Ear pressure: yes bilateral -Vomiting: no -Fatigue: yes -Context: fluctuating -Relief with OTC cold/cough medications: no  -Treatments attempted: tylenol, Corcidin, Dayquil/Nightquil, Mucinex in the morning, Flonase, tessalon perles   Review of Systems  Constitutional:  Positive for malaise/fatigue. Negative for chills and fever.  HENT:  Positive for congestion, sinus pain and sore throat. Negative for ear discharge, ear pain, hearing loss and tinnitus.   Respiratory:  Positive for cough, sputum production and shortness of breath. Negative for wheezing.   Cardiovascular:  Negative for chest pain.  Gastrointestinal:  Negative for abdominal pain, diarrhea, nausea and vomiting.       Objective:    BP 118/84   Pulse 83   Temp 97.9 F (36.6 C)   Resp 16   Ht 5\' 6"  (1.676 m)   Wt 177 lb 14.4 oz (80.7 kg)   LMP 05/18/2022 (Exact Date)   Breastfeeding Unknown   BMI 28.71 kg/m  BP Readings from Last 3 Encounters:  07/19/22 118/84  07/10/22 122/68  06/25/22 134/78   Wt Readings from Last 3 Encounters:  07/19/22 177 lb 14.4 oz (80.7 kg)  07/10/22 178 lb 6.4 oz (80.9 kg)  06/25/22 179 lb 12.8 oz (81.6 kg)      Physical Exam Constitutional:      Appearance: Normal appearance. She is ill-appearing.  HENT:     Head: Normocephalic and atraumatic.     Right Ear: Tympanic membrane, ear canal and external ear normal.     Left Ear: Tympanic membrane, ear canal and external ear normal.     Ears:     Comments: Bilateral eustachian tube dysfunction present.    Nose: Congestion present.     Mouth/Throat:     Mouth: Mucous membranes are moist.     Comments: PND  present  Eyes:     Conjunctiva/sclera: Conjunctivae normal.  Cardiovascular:     Rate and Rhythm: Regular rhythm.  Pulmonary:     Effort: Pulmonary effort is normal.     Breath sounds: Normal breath sounds. No wheezing, rhonchi or rales.  Skin:    General: Skin is warm and dry.  Neurological:     General: No focal  deficit present.     Mental Status: She is alert. Mental status is at baseline.  Psychiatric:        Mood and Affect: Mood normal.        Behavior: Behavior normal.     No results found for any visits on 07/19/22.      Assessment & Plan:   1. Upper respiratory tract infection, unspecified type/Cough, unspecified type: Symptoms still present, about the same just not getting better. Chest x-ray negative for PNA, only thing that has helped her was the Medrol. Will try Prednisone taper for symptoms. Continue Mucinex 1200 mg in the morning, Corcidin and Flonase. Given sample of Breztri because Symbicort was too expensive. Follow up if symptoms continue to worsen. Work note given.  -  predniSONE (DELTASONE) 10 MG tablet; Take 2 tablets (20 mg total) by mouth daily with breakfast for 3 days, THEN 1 tablet (10 mg total) daily with breakfast for 3 days, THEN 0.5 tablets (5 mg total) daily with breakfast for 3 days.  Dispense: 10.5 tablet; Refill: 0   Return if symptoms worsen or fail to improve.  Teodora Medici, DO

## 2022-07-19 NOTE — Patient Instructions (Addendum)
It was great seeing you today!  Plan discussed at today's visit: -Prednisone sent to pharmacy -Use inhaler 2 inhalations daily  -Continue to use Mucinex [phone] mg in the morning, Flonase, Coricidin as needed for symptoms   Follow up in: as needed  Take care and let us know if you have any questions or concerns prior to your next visit.  Dr. Rosana Berger

## 2022-07-24 ENCOUNTER — Ambulatory Visit: Payer: Managed Care, Other (non HMO) | Admitting: Nurse Practitioner

## 2022-07-24 ENCOUNTER — Encounter: Payer: Self-pay | Admitting: Nurse Practitioner

## 2022-07-24 ENCOUNTER — Other Ambulatory Visit (HOSPITAL_COMMUNITY)
Admission: RE | Admit: 2022-07-24 | Discharge: 2022-07-24 | Disposition: A | Discharge status: Home / Self Care | Payer: Managed Care, Other (non HMO) | Source: Ambulatory Visit | Attending: Nurse Practitioner | Admitting: Nurse Practitioner

## 2022-07-24 ENCOUNTER — Encounter: Payer: Self-pay | Admitting: Family Medicine

## 2022-07-24 VITALS — BP 126/78 | HR 98 | Temp 98.3°F | Resp 18 | Wt 180.5 lb

## 2022-07-24 DIAGNOSIS — Z3009 Encounter for other general counseling and advice on contraception: Secondary | ICD-10-CM

## 2022-07-24 DIAGNOSIS — Z202 Contact with and (suspected) exposure to infections with a predominantly sexual mode of transmission: Secondary | ICD-10-CM | POA: Insufficient documentation

## 2022-07-24 DIAGNOSIS — Z113 Encounter for screening for infections with a predominantly sexual mode of transmission: Secondary | ICD-10-CM | POA: Diagnosis present

## 2022-07-24 NOTE — Progress Notes (Signed)
   BP 126/78   Pulse 98   Temp 98.3 F (36.8 C) (Oral)   Resp 18   Wt 180 lb 8 oz (81.9 kg)   LMP 05/18/2022 (Exact Date)   SpO2 99%   BMI 29.13 kg/m    Subjective:    Patient ID: Hannah Gregory, female    DOB: 1981/07/18, 41 y.o.   MRN: [id number]  HPI: Hannah Gregory is a 41 y.o. female  Chief Complaint  Patient presents with   Exposure to STD   Referral    To GYN for Tubiligation   Exposure to STD:  She says she does not  have a known exposure she says she is starting a new relationship and would like to have testing done.  She denies any vaginal discharge. She declines HIV, RPR and Hep c testing at this time. Vaginal swab obtained.  Birth control: Patient reports that she would like to have a referral placed to [facility] for tubal ligation.  Referral placed.  Relevant past medical, surgical, family and social history reviewed and updated as indicated. Interim medical history since our last visit reviewed. Allergies and medications reviewed and updated.  Review of Systems Constitutional: Negative for fever or weight change.  Respiratory: Negative for cough and shortness of breath.   Cardiovascular: Negative for chest pain or palpitations.  Gastrointestinal: Negative for abdominal pain, no bowel changes.  Musculoskeletal: Negative for gait problem or joint swelling.  Skin: Negative for rash.  Neurological: Negative for dizziness or headache.  No other specific complaints in a complete review of systems (except as listed in HPI above).      Objective:    BP 126/78   Pulse 98   Temp 98.3 F (36.8 C) (Oral)   Resp 18   Wt 180 lb 8 oz (81.9 kg)   LMP 05/18/2022 (Exact Date)   SpO2 99%   BMI 29.13 kg/m   Wt Readings from Last 3 Encounters:  07/24/22 180 lb 8 oz (81.9 kg)  07/19/22 177 lb 14.4 oz (80.7 kg)  07/10/22 178 lb 6.4 oz (80.9 kg)    Physical Exam  Constitutional: Patient appears well-developed and well-nourished. Obese  No distress.  HEENT: head  atraumatic, normocephalic, pupils equal and reactive to light,  neck supple Cardiovascular: Normal rate, regular rhythm and normal heart sounds.  No murmur heard. No BLE edema. Pulmonary/Chest: Effort normal and breath sounds normal. No respiratory distress. Abdominal: Soft.  There is no tenderness. Psychiatric: Patient has a normal mood and affect. behavior is normal. Judgment and thought content normal.  Assessment & Plan:   Problem List Items Addressed This Visit   None Visit Diagnoses     STD exposure    -  Primary   Vaginal swab sent for testing.  Patient declined HIV, RPR and hep C screening.   Relevant Orders   Cervicovaginal ancillary only   Birth control counseling       Referral placed to [facility] to discuss tubal ligation.   Relevant Orders   Ambulatory referral to Gynecology        Follow up plan: Return if symptoms worsen or fail to improve.

## 2022-07-26 ENCOUNTER — Other Ambulatory Visit: Payer: Self-pay | Admitting: Nurse Practitioner

## 2022-07-26 DIAGNOSIS — B9689 Other specified bacterial agents as the cause of diseases classified elsewhere: Secondary | ICD-10-CM

## 2022-07-26 LAB — CERVICOVAGINAL ANCILLARY ONLY
Bacterial Vaginitis (gardnerella): POSITIVE — AB
Candida Glabrata: NEGATIVE
Candida Vaginitis: NEGATIVE
Chlamydia: NEGATIVE
Comment: NEGATIVE
Comment: NEGATIVE
Comment: NEGATIVE
Comment: NEGATIVE
Comment: NEGATIVE
Comment: NORMAL
Neisseria Gonorrhea: NEGATIVE
Trichomonas: NEGATIVE

## 2022-07-26 MED ORDER — METRONIDAZOLE 500 MG PO TABS: 500.0000 mg | ORAL_TABLET | Freq: Two times a day (BID) | ORAL | 0 refills | Status: AC

## 2022-08-01 ENCOUNTER — Other Ambulatory Visit: Payer: Self-pay | Admitting: Family Medicine

## 2022-08-01 DIAGNOSIS — G43009 Migraine without aura, not intractable, without status migrainosus: Secondary | ICD-10-CM

## 2022-08-02 ENCOUNTER — Other Ambulatory Visit: Payer: Self-pay | Admitting: Family Medicine

## 2022-08-02 DIAGNOSIS — G43009 Migraine without aura, not intractable, without status migrainosus: Secondary | ICD-10-CM

## 2022-08-02 NOTE — Telephone Encounter (Signed)
Requested medications are due for refill today.  unsure  Requested medications are on the active medications list.  yes  Last refill. 01/02/2022  Future visit scheduled.   no  Notes to clinic.  Both medications are listed as historical    Requested Prescriptions  Pending Prescriptions Disp Refills   venlafaxine XR (EFFEXOR-XR) 150 MG 24 hr capsule [Pharmacy Med Name: Venlafaxine HCl ER 150 MG Capsule extended release 24 hr] 30 capsule 11    Sig: TAKE 1 CAPSULE BY MOUTH ONCE DAILY, ALONG WITH A 75MG  FOR A TOTAL DOSE OF 225MG  *TAKE WITH FOOD* *DO NOT CRUSH OR CHEW*     Psychiatry: Antidepressants - SNRI - desvenlafaxine & venlafaxine Failed - 08/02/2022  2:29 PM      Failed - Cr in normal range and within 360 days    Creat  Date Value Ref Range Status  03/09/2022 0.98 (H) 0.50 - 0.97 mg/dL Final   Creatinine, Urine  Date Value Ref Range Status  02/12/2022 224 20 - 275 mg/dL Final         Failed - Lipid Panel in normal range within the last 12 months    Cholesterol  Date Value Ref Range Status  02/12/2022 206 (H) <200 mg/dL Final   LDL Cholesterol (Calc)  Date Value Ref Range Status  02/12/2022 112 (H) mg/dL (calc) Final    Comment:    Reference range: <100 . Desirable range <100 mg/dL for primary prevention;   <70 mg/dL for patients with CHD or diabetic patients  with > or = 2 CHD risk factors. 02/14/2022 LDL-C is now calculated using the Martin-Hopkins  calculation, which is a validated novel method providing  better accuracy than the Friedewald equation in the  estimation of LDL-C.  02/14/2022 et al. Marland Kitchen. Horald Pollen): 2061-2068  (http://education.[facility].com/faq/FAQ164)    HDL  Date Value Ref Range Status  02/12/2022 38 (L) > OR = 50 mg/dL Final   Triglycerides  Date Value Ref Range Status  02/12/2022 390 (H) <150 mg/dL Final    Comment:    . If a non-fasting specimen was collected, consider repeat triglyceride testing on a fasting specimen if clinically  indicated.  02/14/2022 et al. J. of Clin. Lipidol. 2015;9:129-169. 02/14/2022          Passed - Completed PHQ-2 or PHQ-9 in the last 360 days      Passed - Last BP in normal range    BP Readings from Last 1 Encounters:  07/24/22 126/78         Passed - Valid encounter within last 6 months    Recent Outpatient Visits           1 week ago STD exposure   [facility], FNP   2 weeks ago Upper respiratory tract infection, unspecified type   [facility] Berniece Salines, DO   3 weeks ago Upper respiratory tract infection, unspecified type   [facility] Margarita Mail, DO   1 month ago Amenorrhea   [facility] Margarita Mail, PA-C   4 months ago Peripheral edema   [facility], PA-C               venlafaxine XR (EFFEXOR-XR) 75 MG 24 hr capsule [Pharmacy Med Name: Venlafaxine HCl ER 75 MG Capsule extended release 24 hr] 30 capsule 11    Sig: TAKE 1 CAPSULE BY MOUTH ONCE DAILY, ALONG WITH A 150MG  FOR A TOTAL  DOSE OF 225MG . *TAKE WITH FOOD* *DO NOT CRUSH OR CHEW*     Psychiatry: Antidepressants - SNRI - desvenlafaxine & venlafaxine Failed - 08/02/2022  2:29 PM      Failed - Cr in normal range and within 360 days    Creat  Date Value Ref Range Status  03/09/2022 0.98 (H) 0.50 - 0.97 mg/dL Final   Creatinine, Urine  Date Value Ref Range Status  02/12/2022 224 20 - 275 mg/dL Final         Failed - Lipid Panel in normal range within the last 12 months    Cholesterol  Date Value Ref Range Status  02/12/2022 206 (H) <200 mg/dL Final   LDL Cholesterol (Calc)  Date Value Ref Range Status  02/12/2022 112 (H) mg/dL (calc) Final    Comment:    Reference range: <100 . Desirable range <100 mg/dL for primary prevention;   <70 mg/dL for patients with CHD or diabetic patients  with > or = 2 CHD risk factors. Marland Kitchen LDL-C is now calculated using the Martin-Hopkins   calculation, which is a validated novel method providing  better accuracy than the Friedewald equation in the  estimation of LDL-C.  Cresenciano Genre et al. Annamaria Helling. 3716;967(89): 2061-2068  (http://education.[facility].com/faq/FAQ164)    HDL  Date Value Ref Range Status  02/12/2022 38 (L) > OR = 50 mg/dL Final   Triglycerides  Date Value Ref Range Status  02/12/2022 390 (H) <150 mg/dL Final    Comment:    . If a non-fasting specimen was collected, consider repeat triglyceride testing on a fasting specimen if clinically indicated.  Yates Decamp et al. J. of Clin. Lipidol. 3810;1:751-025. Marland Kitchen          Passed - Completed PHQ-2 or PHQ-9 in the last 360 days      Passed - Last BP in normal range    BP Readings from Last 1 Encounters:  07/24/22 126/78         Passed - Valid encounter within last 6 months    Recent Outpatient Visits           1 week ago STD exposure   [facility] Bo Merino, FNP   2 weeks ago Upper respiratory tract infection, unspecified type   [place], DO   3 weeks ago Upper respiratory tract infection, unspecified type   [facility] Teodora Medici, DO   1 month ago [facility] Delsa Grana, PA-C   4 months ago Peripheral edema   [facility] Delsa Grana, [place]

## 2022-08-08 ENCOUNTER — Other Ambulatory Visit: Payer: Self-pay | Admitting: Family Medicine

## 2022-08-08 DIAGNOSIS — G43009 Migraine without aura, not intractable, without status migrainosus: Secondary | ICD-10-CM

## 2022-08-13 ENCOUNTER — Encounter: Payer: Self-pay | Admitting: Nurse Practitioner

## 2022-08-15 NOTE — Telephone Encounter (Unsigned)
Copied from CRM [id number]. Topic: Quick Conservator, museum/gallery Patient (Clinic Use ONLY) >> Aug 13, 2022  3:43 PM Marinell Blight wrote: Reason for CRM: LVM for patient to call back and speak with Myriam Jacobson to answer a question regarding records. >> Aug 14, 2022  4:40 PM Marinell Blight wrote: LVM for patient to call back to that we obtain some important information in order to process her referral >> Aug 14, 2022  4:12 PM Dondra Prader E wrote: Pt returned call after [facility] left, wants a return call

## 2022-10-31 ENCOUNTER — Other Ambulatory Visit: Payer: Self-pay | Admitting: Family Medicine

## 2022-10-31 DIAGNOSIS — I10 Essential (primary) hypertension: Secondary | ICD-10-CM

## 2022-11-01 ENCOUNTER — Other Ambulatory Visit: Payer: Self-pay

## 2022-11-01 DIAGNOSIS — I10 Essential (primary) hypertension: Secondary | ICD-10-CM

## 2022-11-01 NOTE — Telephone Encounter (Signed)
Lvm and mychart message sent

## 2022-11-01 NOTE — Telephone Encounter (Unsigned)
Copied from [place] [id number]. Topic: General - Other >> Nov 01, 2022  1:47 PM Chapman Fitch wrote: Reason for CRM: pt wanted to let Dr. Kizzie Ide nurse know that she scheduled appt for next Tuesday / in reference to her med refill request / please advise

## 2022-11-05 NOTE — Progress Notes (Deleted)
Name: Hannah Gregory   MRN: [id number]    DOB: 06-12-1981   Date:11/05/2022       Progress Note  Subjective  Chief Complaint  Follow Up  HPI  Hypertension Benign: labile hypertension, but lately it has been staying high, lowest in the 130's range, on Atenolol chlorthalidone 100/25 daily, no cramping, potassium has been normal. We will check urine micro and add norvasc if no improvement refer to Nephrologist   Daily headache/Migraine headaches: chronic, she is under the care of neurologist, Dr. Melrose Nakayama and his PA. Taking higher dose of Effexor 225 mg , Nurtec every other day  Ubrelvy prn. She still has daily headaches but not migraines. She just started botox injections   AnxietyMDD  she is on higher dose of Effexor given by neurologist and is feeling better, took only a few of the buspar and seems to work, taking it less than once a week.  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 Marius Ditch and diagnosed with exocrine pancreatic insuffiencey and was given Creon to take before meals. She states abdominal has decreased significantly but still has episodes of diarrhea.   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   B12 deficiency 12/11/2019   Vitamin D deficiency 12/11/2019    Family history of pernicious anemia 12/11/2019   Migraine without aura and without status migrainosus, not intractable 10/14/2019   Menometrorrhagia 06/17/2019   Chronic daily headache 11/29/2016   Neck pain 11/29/2016   Scoliosis of thoracolumbar spine 11/29/2016   Mild depression 11/29/2016   History of anxiety 11/29/2016    Past Surgical History:  Procedure Laterality Date   GALLBLADDER SURGERY  08/15/2020   INDUCED ABORTION      Family History  Problem Relation Age of Onset   Asthma Mother    Hypertension Mother    Hyperlipidemia Mother    Thyroid disease Mother    Heart attack Father    Migraines Father    Hypertension Father    Hyperlipidemia Father    Mental retardation Brother    COPD Brother    Hypertension Brother    Emphysema Brother    Breast cancer Maternal Grandmother    Breast cancer Maternal Aunt    Breast cancer Maternal Aunt     Social History   Tobacco Use   Smoking status: Every Day    Packs/day: 0.25    Years: 22.00    Total pack years: 5.50    Types: Cigarettes    Start date: 10/01/1997   Smokeless tobacco: Never  Substance Use Topics   Alcohol use: Yes    Alcohol/week: 3.0 standard drinks of alcohol    Types: 3 Shots of liquor per week     Current Outpatient Medications:    amLODipine (NORVASC) 2.5 MG tablet,  Take 1 tablet (2.5 mg total) by mouth daily., Disp: 90 tablet, Rfl: 0   ascorbic acid (VITAMIN C) 500 MG tablet, Take 500 mg by mouth daily., Disp: , Rfl:    atenolol-chlorthalidone (TENORETIC) 100-25 MG tablet, Take 1 tablet by mouth daily., Disp: 90 tablet, Rfl: 0   benzonatate (TESSALON) 100 MG capsule, Take 1 capsule (100 mg total) by mouth 2 (two) times daily as needed for cough., Disp: 20 capsule, Rfl: 0   budesonide-formoterol (SYMBICORT) 160-4.5 MCG/ACT inhaler, Inhale 2 puffs into the lungs 2 (two) times daily., Disp: 1 each, Rfl: 3   busPIRone (BUSPAR) 5 MG tablet, Take 1 tablet (5 mg total) by mouth 2 (two) times daily as  needed., Disp: 60 tablet, Rfl: 0   cholecalciferol (VITAMIN D3) 25 MCG (1000 UNIT) tablet, Take 2,000 Units by mouth daily., Disp: , Rfl:    cyanocobalamin (,VITAMIN B-12,) 1000 MCG/ML injection, INJECT 1ML=1000MCG INTRAMUSCULARLY EVERY MONTH, Disp: 1 mL, Rfl: 11   fenofibrate (TRICOR) 145 MG tablet, Take 1 tablet (145 mg total) by mouth daily., Disp: 90 tablet, Rfl: 1   fluticasone (FLONASE) 50 MCG/ACT nasal spray, Place 2 sprays into both nostrils daily., Disp: 16 g, Rfl: 6   guaiFENesin (MUCINEX) 600 MG 12 hr tablet, Take 1 tablet (600 mg total) by mouth 2 (two) times daily., Disp: 30 tablet, Rfl: 0   hydrALAZINE (APRESOLINE) 25 MG tablet, Take 1 tablet (25 mg total) by mouth 3 (three) times daily as needed (BP above 150/90)., Disp: 90 tablet, Rfl: 0   lipase/protease/amylase (CREON) 36000 UNITS CPEP capsule, Take 1 capsule (36,000 Units total) by mouth 3 (three) times daily with meals. May also take 1 capsule (36,000 Units total) as needed (with snacks)., Disp: 240 capsule, Rfl: 3   norethindrone (MICRONOR) 0.35 MG tablet, Take 1 tablet by mouth daily., Disp: , Rfl:    NURTEC 75 MG TBDP, Take 1 tablet by mouth every other day., Disp: , Rfl:    ondansetron (ZOFRAN-ODT) 4 MG disintegrating tablet, Take 1 tablet (4 mg total) by mouth every 8 (eight) hours as needed for nausea or vomiting., Disp: 20 tablet, Rfl: 0   Ubrogepant 100 MG TABS, Take 1 tablet by mouth daily as needed., Disp: , Rfl:    valACYclovir (VALTREX) 1000 MG tablet, Take 0.5 tablets (500 mg total) by mouth daily. Take 2 tablets every 12 hours for 2 doses total, then stop taking.  Can take in the future if symptoms of cold sores just beginning to help blunt the response, Disp: 90 tablet, Rfl: 1   venlafaxine XR (EFFEXOR-XR) 150 MG 24 hr capsule, Take 150 mg by mouth daily., Disp: , Rfl:    venlafaxine XR (EFFEXOR-XR) 75 MG 24 hr capsule, Take 75 mg by mouth daily with breakfast., Disp: , Rfl:    vitamin B-12 (CYANOCOBALAMIN) 1000 MCG  tablet, Take 1,000 mcg by mouth daily., Disp: , Rfl:   Allergies  Allergen Reactions   Imitrex [Sumatriptan] Other (See Comments)    Side effect , chest pain, flushed    Chantix [Varenicline Tartrate]     nausea   Prednisone Hives and Other (See Comments)    Numbness of her face    Abilify [Aripiprazole] Nausea Only    I personally reviewed active problem list, medication list, allergies, family history, social history, health maintenance with the patient/caregiver today.   ROS  ***  Objective  There were no vitals filed for this visit.  There is no height or weight on file to  calculate BMI.  Physical Exam ***  No results found for this or any previous visit (from the past 2160 hour(s)).   PHQ2/9:    07/24/2022    2:40 PM 07/19/2022    9:34 AM 07/10/2022    1:22 PM 06/25/2022   11:15 AM 03/09/2022   11:05 AM  Depression screen PHQ 2/9  Decreased Interest 0 0 0 0 0  Down, Depressed, Hopeless 0 0 0 0 0  PHQ - 2 Score 0 0 0 0 0  Altered sleeping  0 0 0 0  Tired, decreased energy  0 0 0 0  Change in appetite  0 0 0 0  Feeling bad or failure about yourself   0 0 0 0  Trouble concentrating  0 0 0 0  Moving slowly or fidgety/restless  0 0 0 0  Suicidal thoughts  0 0 0 0  PHQ-9 Score  0 0 0 0  Difficult doing work/chores  Not difficult at all Not difficult at all Not difficult at all Not difficult at all    phq 9 is {gen pos [id number]   Fall Risk:    07/24/2022    2:40 PM 07/19/2022    9:34 AM 07/10/2022    1:22 PM 06/25/2022   11:15 AM 03/09/2022   11:05 AM  Fall Risk   Falls in the past year? 0 0 0 0 0  Number falls in past yr: 0 0 0 0 0  Injury with Fall? 0 0 0 0 0  Risk for fall due to :    No Fall Risks No Fall Risks  Follow up Falls evaluation completed   Falls prevention discussed;Education provided Falls prevention discussed;Education provided      Functional Status Survey:      Assessment & Plan  *** There are no diagnoses linked to  this encounter.

## 2022-11-06 ENCOUNTER — Ambulatory Visit: Payer: Managed Care, Other (non HMO) | Admitting: Family Medicine

## 2022-11-06 NOTE — Progress Notes (Unsigned)
Name: Hannah Gregory   MRN: [id number]    DOB: [birth year]   Date:11/07/2022       Progress Note  Subjective  Chief Complaint  Follow Up  HPI  Hypertension Benign: lBP is finally controlled, taking norvasc 2.5 mg at night and Tenoretic in am's and is doing well, she has hydralazine at home but has not taken it in a while. No chest pain or palpitation.   Migraine headaches:  she is under the care of neurologist, Dr. Malvin Johns and his PA, no longer having chronic daily headaches. . Taking higher dose of Effexor 225 mg ,3 months ago started on Emgality, also getting botox and is doing better, episodes still about once a month and if doesn't respond to [place] , neurologist sends medrol dose pack. She has scotomas, nausea, dizziness associated with intense pounding headaches.   AnxietyMDD  she is on higher dose of Effexor given by neurologist and is feeling better, no longer on Buspar, doing much better now. Depression is in remission   Low pancreatic Elastase: seen by Dr. Allegra Lai and diagnosed with exocrine pancreatic insuffiencey and was given Creon to take before meals, she states abdominal pain and diarrhea resolved, no longer taking medication  B12 deficiency: father has pernicious anemia, she is on B12 injections monthly   Cervical radiculitis: under the care of PMR, she was having radiculitis , having some trigger point injection, some epidural injection and had PT , she is under the care of Dr. 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   B12  deficiency 12/11/2019   Vitamin D deficiency 12/11/2019   Family history of pernicious anemia 12/11/2019   Migraine without aura and without status migrainosus, not intractable 10/14/2019   Menometrorrhagia 06/17/2019   Chronic daily headache 11/29/2016   Neck pain 11/29/2016   Scoliosis of thoracolumbar spine 11/29/2016   Mild depression 11/29/2016   History of anxiety 11/29/2016    Past Surgical History:  Procedure Laterality Date   GALLBLADDER SURGERY  08/15/2020   INDUCED ABORTION      Family History  Problem Relation Age of Onset   Asthma Mother    Hypertension Mother    Hyperlipidemia Mother    Thyroid disease Mother    Heart attack Father    Migraines Father    Hypertension Father    Hyperlipidemia Father    Mental retardation Brother    COPD Brother    Hypertension Brother    Emphysema Brother    Breast cancer Maternal Grandmother    Breast cancer Maternal Aunt    Breast cancer Maternal Aunt     Social History   Tobacco Use   Smoking status: Every Day    Packs/day: 0.25    Years: 22.00    Total pack years: 5.50    Types: Cigarettes    Start date: 10/01/1997   Smokeless tobacco: Never  Substance Use Topics   Alcohol use: Yes    Alcohol/week: 3.0 standard drinks of alcohol    Types: 3 Shots of liquor per week  Current Outpatient Medications:    amLODipine (NORVASC) 2.5 MG tablet, Take 1 tablet (2.5 mg total) by mouth daily., Disp: 90 tablet, Rfl: 0   ascorbic acid (VITAMIN C) 500 MG tablet, Take 500 mg by mouth daily., Disp: , Rfl:    atenolol-chlorthalidone (TENORETIC) 100-25 MG tablet, Take 1 tablet by mouth daily., Disp: 90 tablet, Rfl: 0   busPIRone (BUSPAR) 5 MG tablet, Take 1 tablet (5 mg total) by mouth 2 (two) times daily as needed., Disp: 60 tablet, Rfl: 0   cholecalciferol (VITAMIN D3) 25 MCG (1000 UNIT) tablet, Take 2,000 Units by mouth daily., Disp: , Rfl:    cyanocobalamin (,VITAMIN B-12,) 1000 MCG/ML injection, INJECT 1ML=1000MCG  INTRAMUSCULARLY EVERY MONTH, Disp: 1 mL, Rfl: 11   hydrALAZINE (APRESOLINE) 25 MG tablet, Take 1 tablet (25 mg total) by mouth 3 (three) times daily as needed (BP above 150/90)., Disp: 90 tablet, Rfl: 0   Ubrogepant 100 MG TABS, Take 1 tablet by mouth daily as needed., Disp: , Rfl:    valACYclovir (VALTREX) 1000 MG tablet, Take 0.5 tablets (500 mg total) by mouth daily. Take 2 tablets every 12 hours for 2 doses total, then stop taking.  Can take in the future if symptoms of cold sores just beginning to help blunt the response, Disp: 90 tablet, Rfl: 1   venlafaxine XR (EFFEXOR-XR) 150 MG 24 hr capsule, Take 150 mg by mouth daily., Disp: , Rfl:    venlafaxine XR (EFFEXOR-XR) 75 MG 24 hr capsule, Take 75 mg by mouth daily with breakfast., Disp: , Rfl:    vitamin B-12 (CYANOCOBALAMIN) 1000 MCG tablet, Take 1,000 mcg by mouth daily., Disp: , Rfl:    benzonatate (TESSALON) 100 MG capsule, Take 1 capsule (100 mg total) by mouth 2 (two) times daily as needed for cough. (Patient not taking: Reported on 11/07/2022), Disp: 20 capsule, Rfl: 0   budesonide-formoterol (SYMBICORT) 160-4.5 MCG/ACT inhaler, Inhale 2 puffs into the lungs 2 (two) times daily. (Patient not taking: Reported on 11/07/2022), Disp: 1 each, Rfl: 3   fenofibrate (TRICOR) 145 MG tablet, Take 1 tablet (145 mg total) by mouth daily. (Patient not taking: Reported on 11/07/2022), Disp: 90 tablet, Rfl: 1   fluticasone (FLONASE) 50 MCG/ACT nasal spray, Place 2 sprays into both nostrils daily. (Patient not taking: Reported on 11/07/2022), Disp: 16 g, Rfl: 6   guaiFENesin (MUCINEX) 600 MG 12 hr tablet, Take 1 tablet (600 mg total) by mouth 2 (two) times daily. (Patient not taking: Reported on 11/07/2022), Disp: 30 tablet, Rfl: 0   lipase/protease/amylase (CREON) 36000 UNITS CPEP capsule, Take 1 capsule (36,000 Units total) by mouth 3 (three) times daily with meals. May also take 1 capsule (36,000 Units total) as needed (with snacks). (Patient not taking: Reported  on 11/07/2022), Disp: 240 capsule, Rfl: 3   norethindrone (MICRONOR) 0.35 MG tablet, Take 1 tablet by mouth daily. (Patient not taking: Reported on 11/07/2022), Disp: , Rfl:    NURTEC 75 MG TBDP, Take 1 tablet by mouth every other day. (Patient not taking: Reported on 11/07/2022), Disp: , Rfl:    ondansetron (ZOFRAN-ODT) 4 MG disintegrating tablet, Take 1 tablet (4 mg total) by mouth every 8 (eight) hours as needed for nausea or vomiting. (Patient not taking: Reported on 11/07/2022), Disp: 20 tablet, Rfl: 0  Allergies  Allergen Reactions   Imitrex [Sumatriptan] Other (See Comments)    Side effect , chest pain, flushed    Chantix [Varenicline Tartrate]     nausea   Prednisone Hives and  Other (See Comments)    Numbness of her face    Abilify [Aripiprazole] Nausea Only    I personally reviewed active problem list, medication list, allergies, family history, social history, health maintenance with the patient/caregiver today.   ROS  Constitutional: Negative for fever or weight change.  Respiratory: Negative for cough and shortness of breath.   Cardiovascular: Negative for chest pain or palpitations.  Gastrointestinal: Negative for abdominal pain, no bowel changes.  Musculoskeletal: Negative for gait problem or joint swelling.  Skin: Negative for rash.  Neurological: Negative for dizziness , positive for intermittent headache.  No other specific complaints in a complete review of systems (except as listed in HPI above).   Objective  Vitals:   11/07/22 0817  BP: 126/72  Pulse: 94  Resp: 16  SpO2: 99%  Weight: 179 lb (81.2 kg)  Height: 5\' 6"  (1.676 m)    Body mass index is 28.89 kg/m.  Physical Exam  Constitutional: Patient appears well-developed and well-nourished.  No distress.  HEENT: head atraumatic, normocephalic, pupils equal and reactive to light, neck supple Cardiovascular: Normal rate, regular rhythm and normal heart sounds.  No murmur heard. No BLE  edema. Pulmonary/Chest: Effort normal and breath sounds normal. No respiratory distress. Abdominal: Soft.  There is no tenderness. Psychiatric: Patient has a normal mood and affect. behavior is normal. Judgment and thought content normal.   PHQ2/9:    11/07/2022    8:16 AM 07/24/2022    2:40 PM 07/19/2022    9:34 AM 07/10/2022    1:22 PM 06/25/2022   11:15 AM  Depression screen PHQ 2/9  Decreased Interest 0 0 0 0 0  Down, Depressed, Hopeless 0 0 0 0 0  PHQ - 2 Score 0 0 0 0 0  Altered sleeping 3  0 0 0  Tired, decreased energy 0  0 0 0  Change in appetite 0  0 0 0  Feeling bad or failure about yourself  0  0 0 0  Trouble concentrating 0  0 0 0  Moving slowly or fidgety/restless 0  0 0 0  Suicidal thoughts 0  0 0 0  PHQ-9 Score 3  0 0 0  Difficult doing work/chores   Not difficult at all Not difficult at all Not difficult at all    phq 9 is negative   Fall Risk:    11/07/2022    8:16 AM 07/24/2022    2:40 PM 07/19/2022    9:34 AM 07/10/2022    1:22 PM 06/25/2022   11:15 AM  Fall Risk   Falls in the past year? 0 0 0 0 0  Number falls in past yr: 0 0 0 0 0  Injury with Fall? 0 0 0 0 0  Risk for fall due to : No Fall Risks    No Fall Risks  Follow up Falls prevention discussed Falls evaluation completed   Falls prevention discussed;Education provided      Functional Status Survey: Is the patient deaf or have difficulty hearing?: No Does the patient have difficulty seeing, even when wearing glasses/contacts?: No Does the patient have difficulty concentrating, remembering, or making decisions?: No Does the patient have difficulty walking or climbing stairs?: No Does the patient have difficulty dressing or bathing?: No Does the patient have difficulty doing errands alone such as visiting a doctor's office or shopping?: No    Assessment & Plan  1. Major depression in remission [facility])  Doing well on Effexor   2. Hypertension,  benign  - atenolol-chlorthalidone  (TENORETIC) 100-25 MG tablet; Take 1 tablet by mouth daily.  Dispense: 90 tablet; Refill: 1 - amLODipine (NORVASC) 2.5 MG tablet; Take 1 tablet (2.5 mg total) by mouth every evening.  Dispense: 90 tablet; Refill: 1  3. Vitamin D deficiency  Continue supplementation   4. Dyslipidemia  - fenofibrate (TRICOR) 145 MG tablet; Take 1 tablet (145 mg total) by mouth daily.  Dispense: 90 tablet; Refill: 1  5. Migraine without aura and without status migrainosus, not intractable  Under the care of neurologist   6. Tobacco use disorder   7. Screen for STD (sexually transmitted disease)  - Cervicovaginal ancillary only  8. B12 deficiency  - cyanocobalamin (VITAMIN B12) 1000 MCG/ML injection; Inject 1 mL (1,000 mcg total) into the muscle every 30 (thirty) days.  Dispense: 1 mL; Refill: 11  9. General counseling and advice on female contraception  - Ambulatory referral to Obstetrics / Gynecology

## 2022-11-07 ENCOUNTER — Encounter: Payer: Self-pay | Admitting: Family Medicine

## 2022-11-07 ENCOUNTER — Other Ambulatory Visit (HOSPITAL_COMMUNITY)
Admission: RE | Admit: 2022-11-07 | Discharge: 2022-11-07 | Disposition: A | Discharge status: Home / Self Care | Payer: Managed Care, Other (non HMO) | Source: Ambulatory Visit | Attending: Family Medicine | Admitting: Family Medicine

## 2022-11-07 ENCOUNTER — Ambulatory Visit: Payer: Managed Care, Other (non HMO) | Admitting: Family Medicine

## 2022-11-07 VITALS — BP 126/72 | HR 94 | Resp 16 | Ht 66.0 in | Wt 179.0 lb

## 2022-11-07 DIAGNOSIS — F17200 Nicotine dependence, unspecified, uncomplicated: Secondary | ICD-10-CM

## 2022-11-07 DIAGNOSIS — E559 Vitamin D deficiency, unspecified: Secondary | ICD-10-CM

## 2022-11-07 DIAGNOSIS — I10 Essential (primary) hypertension: Secondary | ICD-10-CM

## 2022-11-07 DIAGNOSIS — Z3009 Encounter for other general counseling and advice on contraception: Secondary | ICD-10-CM

## 2022-11-07 DIAGNOSIS — E785 Hyperlipidemia, unspecified: Secondary | ICD-10-CM | POA: Diagnosis not present

## 2022-11-07 DIAGNOSIS — Z113 Encounter for screening for infections with a predominantly sexual mode of transmission: Secondary | ICD-10-CM | POA: Diagnosis not present

## 2022-11-07 DIAGNOSIS — F325 Major depressive disorder, single episode, in full remission: Secondary | ICD-10-CM

## 2022-11-07 DIAGNOSIS — G43009 Migraine without aura, not intractable, without status migrainosus: Secondary | ICD-10-CM

## 2022-11-07 DIAGNOSIS — E538 Deficiency of other specified B group vitamins: Secondary | ICD-10-CM

## 2022-11-07 MED ORDER — CYANOCOBALAMIN 1000 MCG/ML IJ SOLN: 1000.0000 ug | INTRAMUSCULAR | 11 refills | Status: AC

## 2022-11-07 MED ORDER — ATENOLOL-CHLORTHALIDONE 100-25 MG PO TABS: 1.0000 | ORAL_TABLET | Freq: Every day | ORAL | 1 refills | Status: AC

## 2022-11-07 MED ORDER — FENOFIBRATE 145 MG PO TABS: 145.0000 mg | ORAL_TABLET | Freq: Every day | ORAL | 1 refills | Status: AC

## 2022-11-07 MED ORDER — AMLODIPINE BESYLATE 2.5 MG PO TABS: 2.5000 mg | ORAL_TABLET | Freq: Every evening | ORAL | 1 refills | Status: AC

## 2022-11-08 LAB — CERVICOVAGINAL ANCILLARY ONLY
Chlamydia: NEGATIVE
Comment: NEGATIVE
Comment: NEGATIVE
Comment: NORMAL
Neisseria Gonorrhea: NEGATIVE
Trichomonas: NEGATIVE

## 2022-11-23 ENCOUNTER — Other Ambulatory Visit: Payer: Self-pay | Admitting: Obstetrics and Gynecology

## 2022-12-12 NOTE — H&P (Signed)
Preoperative History and Physical  Hannah Gregory is a 42 y.o. [id number] here for surgical management of contraception by sterilization.   No significant preoperative concerns.  History of Present Illness: She had an AB in October for which she used medication and then had vacuum aspiration. She has not had a period since then. She has had a negative pregnancy test since that time. She is abstinent as a form of contraception.   Proposed surgery: robot assisted laparoscopic bilateral salpingectomy  Past Medical History:  Diagnosis Date   Anxiety    Arthralgia of both hands 05/2021   B12 deficiency    BV (bacterial vaginosis) 07/2022   Cervicalgia    Chronic diarrhea 2022   Chronic rhinitis    COVID-19 06/2020   Dyslipidemia    Family history of breast cancer    maternal aunts, MGM   Family history of pernicious anemia    Fatty liver    Gonorrhea 01/2022   History of kidney stones    Labile essential hypertension 11/2021   MDD (major depressive disorder)    Menometrorrhagia 2020   use of Depo injection   Migraine headache    Pancreatic insufficiency 01/2022   Recurrent cold sores    Recurrent pansinusitis    Renal colic on left side [id number]   Scoliosis of thoracolumbar spine 11/2016   Tobacco use disorder    Ureterolithiasis 05/2017   Vision disturbance    Vitamin D deficiency    Past Surgical History:  Procedure Laterality Date   LAPAROSCOPIC CHOLECYSTECTOMY  08/2020   robotic assisted by isami sakai   OB History  Gravida Para Term Preterm AB Living  '5 3 3   2 3  '$ SAB IAB Ectopic Molar Multiple Live Births            3    # Outcome Date GA Lbr Len/2nd Weight Sex Delivery Anes PTL Lv  5 AB 07/2022          4 AB 2019          3 Term         LIV  2 Term         LIV  1 Term         LIV  Patient denies any other pertinent gynecologic issues.   Current Outpatient Medications on File Prior to Visit  Medication Sig Dispense Refill   amLODIPine (NORVASC) 2.5 MG tablet  Take 2.5 mg by mouth once daily     ascorbic acid, vitamin C, (VITAMIN C) 500 MG tablet Take 500 mg by mouth once daily     atenoloL-chlorthalidone (TENORETIC) 100-25 mg tablet Take 1 tablet by mouth once daily     baclofen (LIORESAL) 5 mg tablet Take 5-'10mg'$  nightly for headaches 60 tablet 0   benzonatate (TESSALON) 200 MG capsule Take 1 capsule (200 mg total) by mouth 3 (three) times daily as needed for Cough 30 capsule 1   BOTOX 200 unit SolR every 3 (three) months     busPIRone (BUSPAR) 5 MG tablet Take 5 mg by mouth 2 (two) times daily     butalbital-acetaminophen-caffeine (FIORICET) 50-325-40 mg tablet Take 1 tablet at headache onset, can repeat after 4 hours. No more than 2 pills in 24 hours. Do not take more than 2-3 times a week MAXIMUM. 30 tablet 0   cyanocobalamin (VITAMIN B12) 1,000 mcg/mL injection Take 23m once a week for four weeks. (Patient taking differently: monthly Take 177mmonthly.) 1 mL 3  galcanezumab-gnlm (EMGALITY PEN) 120 mg/mL PnIj Inject 1 mL subcutaneously monthly 1 mL 11   hydrALAZINE (APRESOLINE) 25 MG tablet Take 25 mg by mouth 3 (three) times daily     HYDROcodone-acetaminophen (NORCO) 5-325 mg tablet Take 1 tablet by mouth at bedtime as needed for Pain 12 tablet 0   HYDROcodone-chlorpheniramine (TUSSIONEX) 10-8 mg/5 mL ER suspension Take 5 mLs by mouth every 12 (twelve) hours as needed for Cough 120 mL 0   ipratropium (ATROVENT) 21 mcg (0.03 %) nasal spray Place 2 sprays into both nostrils 2 (two) times daily 2 sprays in each nostril 2 or 3 times a day 30 mL 0   meloxicam (MOBIC) 15 MG tablet Take 1 tablet (15 mg total) by mouth once daily 10 tablet 0   omeg3/dha/epa/fish oil/L.casei (RESTORA ORAL) Take by mouth     pancrelipase (CREON) 36,000-114,000-180,000 unit DR capsule Take by mouth 3 (three) times daily with meals     promethazine-dextromethorphan (PROMETHAZINE-DM) 6.25-15 mg/5 mL syrup Take 5 mLs by mouth every 6 (six) hours as needed for Cough Maximum 30 mL  / 24-hour 120 mL 0   VITAMIN D3 ORAL Take 2,000 Units by mouth     medroxyPROGESTERone (PROVERA) 10 MG tablet Take 1 tablet (10 mg total) by mouth once daily for 10 days 10 tablet 0   venlafaxine (EFFEXOR) 75 MG tablet Take 3 tablets (225 mg total) by mouth once daily for 30 days 270 tablet 1   No current facility-administered medications on file prior to visit.   Allergies  Allergen Reactions   Sumatriptan Other (See Comments)    Side effect , chest pain, flushed    Prednisone Hives and Other (See Comments)    Numbness of her face   Varenicline Tartrate Other (See Comments)    nausea   Aripiprazole Nausea    Social History:   reports that she has been smoking cigarettes. She has been smoking an average of 0.5 packs per day. She has been exposed to tobacco smoke. She has never used smokeless tobacco. She reports current alcohol use. She reports that she does not use drugs.  Family History  Problem Relation Age of Onset   Asthma Mother    Migraines Mother    High blood pressure (Hypertension) Mother    Hyperlipidemia (Elevated cholesterol) Mother    Thyroid disease Mother    High blood pressure (Hypertension) Father    Stroke Father    Migraines Father    Hyperlipidemia (Elevated cholesterol) Father    Myocardial Infarction (Heart attack) Father    High blood pressure (Hypertension) Brother    Diabetes Brother    COPD Brother    Emphysema Brother    Mental retardation Brother    Breast cancer Maternal Grandmother    Cancer Paternal Grandmother    Breast cancer Maternal Aunt    Breast cancer Maternal Aunt     Review of Systems: Noncontributory  PHYSICAL EXAM: Blood pressure (!) 164/106, pulse 103, weight 79.6 kg (175 lb 6.4 oz). CONSTITUTIONAL: Well-developed, well-nourished female in no acute distress.  HENT:  Normocephalic, atraumatic, External right and left ear normal. Oropharynx is clear and moist EYES: Conjunctivae and EOM are normal. Pupils are equal, round, and  reactive to light. No scleral icterus.  NECK: Normal range of motion, supple, no masses SKIN: Skin is warm and dry. No rash noted. Not diaphoretic. No erythema. No pallor. [name]: Alert and oriented to person, place, and time. Normal reflexes, muscle tone coordination. No cranial nerve deficit  noted. PSYCHIATRIC: Normal mood and affect. Normal behavior. Normal judgment and thought content. CARDIOVASCULAR: Normal heart rate noted, regular rhythm RESPIRATORY: Effort and breath sounds normal, no problems with respiration noted ABDOMEN: Soft, nontender, nondistended. PELVIC: Deferred MUSCULOSKELETAL: Normal range of motion. No edema and no tenderness. 2+ distal pulses.  Assessment: Sterilization  Plan: Patient will undergo surgical management with the above-noted surgery.   The risks of surgery were discussed in detail with the patient including but not limited to: bleeding which may require transfusion or reoperation; infection which may require antibiotics; injury to surrounding organs which may involve bowel, bladder, ureters ; need for additional procedures including laparoscopy or laparotomy; thromboembolic phenomenon, surgical site problems and other postoperative/anesthesia complications. Likelihood of success in alleviating the patient's condition was discussed. Routine postoperative instructions will be reviewed with the patient and her family in detail after surgery.  The patient concurred with the proposed plan, giving informed written consent for the surgery.   Preoperative prophylactic antibiotics, as indicated, and SCDs ordered on call to the OR.     Attestation Statement:   I personally performed the service. (TP)  Gray, MD  [place] 12/12/2022 4:09 PM

## 2022-12-13 ENCOUNTER — Other Ambulatory Visit: Payer: Self-pay

## 2022-12-13 ENCOUNTER — Emergency Department
Admission: EM | Admit: 2022-12-13 | Discharge: 2022-12-13 | Disposition: A | Discharge status: Home / Self Care | Payer: Managed Care, Other (non HMO) | Attending: Emergency Medicine | Admitting: Emergency Medicine

## 2022-12-13 ENCOUNTER — Emergency Department: Payer: Managed Care, Other (non HMO)

## 2022-12-13 DIAGNOSIS — I10 Essential (primary) hypertension: Secondary | ICD-10-CM | POA: Diagnosis not present

## 2022-12-13 DIAGNOSIS — R1032 Left lower quadrant pain: Secondary | ICD-10-CM | POA: Insufficient documentation

## 2022-12-13 DIAGNOSIS — R109 Unspecified abdominal pain: Secondary | ICD-10-CM

## 2022-12-13 HISTORY — DX: Essential (primary) hypertension: I10

## 2022-12-13 HISTORY — DX: Hyperlipidemia, unspecified: E78.5

## 2022-12-13 LAB — URINALYSIS, ROUTINE W REFLEX MICROSCOPIC
Bilirubin Urine: NEGATIVE
Glucose, UA: NEGATIVE mg/dL
Ketones, ur: NEGATIVE mg/dL
Nitrite: NEGATIVE
Protein, ur: 30 mg/dL — AB
Specific Gravity, Urine: 1.023 (ref 1.005–1.030)
pH: 5 (ref 5.0–8.0)

## 2022-12-13 LAB — CBC
HCT: 43.1 % (ref 36.0–46.0)
Hemoglobin: 14.6 g/dL (ref 12.0–15.0)
MCH: 33 pg (ref 26.0–34.0)
MCHC: 33.9 g/dL (ref 30.0–36.0)
MCV: 97.3 fL (ref 80.0–100.0)
Platelets: 316 10*3/uL (ref 150–400)
RBC: 4.43 MIL/uL (ref 3.87–5.11)
RDW: 12.6 % (ref 11.5–15.5)
WBC: 8.7 10*3/uL (ref 4.0–10.5)
nRBC: 0 % (ref 0.0–0.2)

## 2022-12-13 LAB — LIPASE, BLOOD: Lipase: 33 U/L (ref 11–51)

## 2022-12-13 LAB — COMPREHENSIVE METABOLIC PANEL
ALT: 13 U/L (ref 0–44)
AST: 19 U/L (ref 15–41)
Albumin: 3.7 g/dL (ref 3.5–5.0)
Alkaline Phosphatase: 81 U/L (ref 38–126)
Anion gap: 9 (ref 5–15)
BUN: 16 mg/dL (ref 6–20)
CO2: 21 mmol/L — ABNORMAL LOW (ref 22–32)
Calcium: 8.7 mg/dL — ABNORMAL LOW (ref 8.9–10.3)
Chloride: 107 mmol/L (ref 98–111)
Creatinine, Ser: 1.12 mg/dL — ABNORMAL HIGH (ref 0.44–1.00)
GFR, Estimated: >60 mL/min (ref >60)
Glucose, Bld: 119 mg/dL — ABNORMAL HIGH (ref 70–99)
Potassium: 3.2 mmol/L — ABNORMAL LOW (ref 3.5–5.1)
Sodium: 137 mmol/L (ref 135–145)
Total Bilirubin: 1.1 mg/dL (ref 0.3–1.2)
Total Protein: 7.1 g/dL (ref 6.5–8.1)

## 2022-12-13 LAB — POC URINE PREG, ED: Preg Test, Ur: NEGATIVE

## 2022-12-13 NOTE — ED Provider Notes (Signed)
[facility] Provider Note    Event Date/Time   First MD Initiated Contact with Patient 12/13/22 1352     (approximate)  History   Chief Complaint: Abdominal Pain  HPI  Hannah Gregory is a 42 y.o. female with a past medical history of hypertension, hyperlipidemia, kidney stones, presents to the emergency department for left flank pain.  According to the patient for the past 2 days she has had intermittent pain in her left lower quadrant wrapping around to her left flank.  Patient states a history of kidney stones which this feels similar.  Has noticed dark urine as well.  No known fever.  Physical Exam   Triage Vital Signs: ED Triage Vitals [12/13/22 1340]  Enc Vitals Group     BP (!) 159/110     Pulse Rate 82     Resp 18     Temp 97.9 F (36.6 C)     Temp Source Oral     SpO2 98 %     Weight      Height      Head Circumference      Peak Flow      Pain Score 6     Pain Loc      Pain Edu?      Excl. in [place]?     Most recent vital signs: Vitals:   12/13/22 1340  BP: (!) 159/110  Pulse: 82  Resp: 18  Temp: 97.9 F (36.6 C)  SpO2: 98%    General: Awake, no distress.  CV:  Good peripheral perfusion.  Regular rate and rhythm  Resp:  Normal effort.  Equal breath sounds bilaterally.  Abd:  No distention.  Soft, mild left lower quadrant tenderness.  No CVA tenderness.  ED Results / Procedures / Treatments   RADIOLOGY  I reviewed and interpreted the CT images, no obvious bowel obstruction no obvious kidney stone seen on my evaluation. Radiology has read a CT and is negative for acute abnormality there is a 5 mm left lower pole renal stone but no ureteral stones.   MEDICATIONS ORDERED IN ED: Medications - No data to display   IMPRESSION / MDM / [place] / ED COURSE  I reviewed the triage vital signs and the nursing notes.  Patient's presentation is most consistent with acute presentation with potential threat to life or bodily  function.  Patient presents emergency department for left flank pain intermittent over the past 2 days worse today.  Patient states a history of kidney stones, states she is having some dark urine as well denies any dysuria.  No vaginal bleeding or discharge.  No fever nausea vomiting or diarrhea.  Patient does have mild left lower quadrant tenderness to palpation.  Patient's lab work has resulted showing reassuring CBC with a normal white blood cell count, reassuring chemistry, negative lipase, negative pregnancy test.  Urinalysis pending.  Given the patient's pain we will proceed with a CT scan to further evaluate.  Differential would include UTI, pyelonephritis, ureterolithiasis, colitis or diverticulitis.  CT scan shows no significant findings.  Patient's lab work is reassuring with a normal CBC normal chemistry, reassuring urinalysis with a very slight amount of blood, negative pregnancy test.  There is rare bacteria however there is also 11-20 squamous cells.  We will send a urine culture as a precaution but will hold off on antibiotics.  Patient agreeable to plan of care.  FINAL CLINICAL IMPRESSION(S) / ED DIAGNOSES   Left  flank pain   Note:  This document was prepared using Dragon voice recognition software and may include unintentional dictation errors.   Harvest Dark, MD 12/13/22 [phone]

## 2022-12-13 NOTE — ED Triage Notes (Signed)
Pt to ED via POV from [facility]. Pt reports LL back pain and LLQ pain x2 days. Pt reports dark color urine. Pt reports hx kidney stones.

## 2022-12-15 LAB — URINE CULTURE: Culture: <10000 — AB

## 2022-12-17 ENCOUNTER — Encounter
Admission: RE | Admit: 2022-12-17 | Discharge: 2022-12-17 | Disposition: A | Discharge status: Home / Self Care | Payer: Managed Care, Other (non HMO) | Source: Ambulatory Visit | Attending: Obstetrics and Gynecology | Admitting: Obstetrics and Gynecology

## 2022-12-17 ENCOUNTER — Other Ambulatory Visit: Payer: Self-pay

## 2022-12-17 ENCOUNTER — Encounter: Payer: Self-pay | Admitting: Urgent Care

## 2022-12-17 ENCOUNTER — Other Ambulatory Visit: Payer: Managed Care, Other (non HMO)

## 2022-12-17 VITALS — Ht 65.0 in | Wt 175.0 lb

## 2022-12-17 DIAGNOSIS — Z01818 Encounter for other preprocedural examination: Secondary | ICD-10-CM | POA: Insufficient documentation

## 2022-12-17 DIAGNOSIS — Z01812 Encounter for preprocedural laboratory examination: Secondary | ICD-10-CM

## 2022-12-17 DIAGNOSIS — I10 Essential (primary) hypertension: Secondary | ICD-10-CM | POA: Diagnosis not present

## 2022-12-17 DIAGNOSIS — Z302 Encounter for sterilization: Secondary | ICD-10-CM

## 2022-12-17 DIAGNOSIS — E876 Hypokalemia: Secondary | ICD-10-CM | POA: Diagnosis not present

## 2022-12-17 HISTORY — DX: Personal history of urinary calculi: Z87.442

## 2022-12-17 HISTORY — DX: Sleep apnea, unspecified: G47.30

## 2022-12-17 LAB — COMPREHENSIVE METABOLIC PANEL
ALT: 13 U/L (ref 0–44)
AST: 22 U/L (ref 15–41)
Albumin: 3.8 g/dL (ref 3.5–5.0)
Alkaline Phosphatase: 81 U/L (ref 38–126)
Anion gap: 10 (ref 5–15)
BUN: 16 mg/dL (ref 6–20)
CO2: 21 mmol/L — ABNORMAL LOW (ref 22–32)
Calcium: 8.8 mg/dL — ABNORMAL LOW (ref 8.9–10.3)
Chloride: 107 mmol/L (ref 98–111)
Creatinine, Ser: 1.02 mg/dL — ABNORMAL HIGH (ref 0.44–1.00)
GFR, Estimated: >60 mL/min (ref >60)
Glucose, Bld: 118 mg/dL — ABNORMAL HIGH (ref 70–99)
Potassium: 3.3 mmol/L — ABNORMAL LOW (ref 3.5–5.1)
Sodium: 138 mmol/L (ref 135–145)
Total Bilirubin: 1.2 mg/dL (ref 0.3–1.2)
Total Protein: 7.2 g/dL (ref 6.5–8.1)

## 2022-12-17 LAB — TYPE AND SCREEN
ABO/RH(D): O POS
Antibody Screen: NEGATIVE

## 2022-12-17 NOTE — Pre-Procedure Instructions (Signed)
K+ is 3.3.Marland KitchenI sent Dr Eduard Roux inbasket

## 2022-12-17 NOTE — Patient Instructions (Addendum)
Your procedure is scheduled on: Thursday December 27, 2022. Report to the Registration Desk on the 1st floor of the [facility]. To find out your arrival time, please call [phone] between 1PM - 3PM on: Wednesday December 26, 2022. If your arrival time is 6:00 am, do not arrive before that time as the [facility] entrance doors do not open until 6:00 am.  REMEMBER: Instructions that are not followed completely may result in serious medical risk, up to and including death; or upon the discretion of your surgeon and anesthesiologist your surgery may need to be rescheduled.  Do not eat food after midnight the night before surgery.  No gum chewing or hard candies.  You may however, drink CLEAR liquids up to 2 hours before you are scheduled to arrive for your surgery. Do not drink anything within 2 hours of your scheduled arrival time.  Clear liquids include: - water  - apple juice without pulp - gatorade (not RED colors) - black coffee or tea (Do NOT add milk or creamers to the coffee or tea) Do NOT drink anything that is not on this list.   In addition, your doctor has ordered for you to drink the provided:  Ensure Pre-Surgery Clear Carbohydrate Drink  Drinking this carbohydrate drink up to two hours before surgery helps to reduce insulin resistance and improve patient outcomes. Please complete drinking 2 hours before scheduled arrival time.  One week prior to surgery: Stop Anti-inflammatories (NSAIDS) such as Advil, Aleve, Ibuprofen, Motrin, Naproxen, Naprosyn and Aspirin based products such as Excedrin, Goody's Powder, BC Powder. Stop ALL OVER THE COUNTER vitamins and or supplements until after surgery. You may however, continue to take Tylenol if needed for pain up until the day of surgery.   Continue taking all prescribed medications with the exception of the following: meloxicam (MOBIC) 15 MG    Follow recommendations from Cardiologist or PCP regarding stopping blood  thinners.  TAKE ONLY THESE MEDICATIONS THE MORNING OF SURGERY WITH A SIP OF WATER:  venlafaxine XR (EFFEXOR-XR) 225 MG  atenolol-chlorthalidone (TENORETIC) 100-25 MG  busPIRone (BUSPAR) 5 MG (if needed)   No Alcohol for 24 hours before or after surgery.  No Smoking including e-cigarettes for 24 hours before surgery.  No chewable tobacco products for at least 6 hours before surgery.  No nicotine patches on the day of surgery.  Do not use any "recreational" drugs for at least a week (preferably 2 weeks) before your surgery.  Please be advised that the combination of cocaine and anesthesia may have negative outcomes, up to and including death. If you test positive for cocaine, your surgery will be cancelled.  On the morning of surgery brush your teeth with toothpaste and water, you may rinse your mouth with mouthwash if you wish. Do not swallow any toothpaste or mouthwash.  Use CHG Soap or wipes as directed on instruction sheet.  Do not wear jewelry, make-up, hairpins, clips or nail polish.  Do not wear lotions, powders, or perfumes.   Do not shave body hair from the neck down 48 hours before surgery.  Contact lenses, hearing aids and dentures may not be worn into surgery.  Do not bring valuables to the hospital. [facility] is not responsible for any missing/lost belongings or valuables.   Notify your doctor if there is any change in your medical condition (cold, fever, infection).  Wear comfortable clothing (specific to your surgery type) to the hospital.  After surgery, you can help prevent lung complications by  doing breathing exercises.  Take deep breaths and cough every 1-2 hours. Your doctor may order a device called an Incentive Spirometer to help you take deep breaths. When coughing or sneezing, hold a pillow firmly against your incision with both hands. This is called "splinting." Doing this helps protect your incision. It also decreases belly discomfort.  If you are  being admitted to the hospital overnight, leave your suitcase in the car. After surgery it may be brought to your room.  In case of increased patient census, it may be necessary for you, the patient, to continue your postoperative care in the Same Day Surgery department.  If you are being discharged the day of surgery, you will not be allowed to drive home. You will need a responsible individual to drive you home and stay with you for 24 hours after surgery.   If you are taking public transportation, you will need to have a responsible individual with you.  Please call the [facility] Dept. at [phone] if you have any questions about these instructions.  Surgery Visitation Policy:  Patients undergoing a surgery or procedure may have two family members or support persons with them as long as the person is not COVID-19 positive or experiencing its symptoms.   Inpatient Visitation:    Visiting hours are 7 a.m. to 8 p.m. Up to four visitors are allowed at one time in a patient room. The visitors may rotate out with other people during the day. One designated support person (adult) may remain overnight.  Due to an increase in RSV and influenza rates and associated hospitalizations, children ages 49 and under will not be able to visit patients in [facility]. Masks continue to be strongly recommended.     Preparing for Surgery with CHLORHEXIDINE GLUCONATE (CHG) Soap  Chlorhexidine Gluconate (CHG) Soap  o An antiseptic cleaner that kills germs and bonds with the skin to continue killing germs even after washing  o Used for showering the night before surgery and morning of surgery  Before surgery, you can play an important role by reducing the number of germs on your skin.  CHG (Chlorhexidine gluconate) soap is an antiseptic cleanser which kills germs and bonds with the skin to continue killing germs even after washing.  Please do not use if you have an allergy  to CHG or antibacterial soaps. If your skin becomes reddened/irritated stop using the CHG.  1. Shower the NIGHT BEFORE SURGERY and the MORNING OF SURGERY with CHG soap.  2. If you choose to wash your hair, wash your hair first as usual with your normal shampoo.  3. After shampooing, rinse your hair and body thoroughly to remove the shampoo.  4. Use CHG as you would any other liquid soap. You can apply CHG directly to the skin and wash gently with a scrungie or a clean washcloth.  5. Apply the CHG soap to your body only from the neck down. Do not use on open wounds or open sores. Avoid contact with your eyes, ears, mouth, and genitals (private parts). Wash face and genitals (private parts) with your normal soap.  6. Wash thoroughly, paying special attention to the area where your surgery will be performed.  7. Thoroughly rinse your body with warm water.  8. Do not shower/wash with your normal soap after using and rinsing off the CHG soap.  9. Pat yourself dry with a clean towel.  10. Wear clean pajamas to bed the night before surgery.  12.  Place clean sheets on your bed the night of your first shower and do not sleep with pets.  13. Shower again with the CHG soap on the day of surgery prior to arriving at the hospital.  14. Do not apply any deodorants/lotions/powders.  15. Please wear clean clothes to the hospital.

## 2022-12-26 MED ORDER — FAMOTIDINE 20 MG PO TABS: 20.0000 mg | ORAL_TABLET | Freq: Once | ORAL | Status: AC

## 2022-12-26 MED ORDER — ORAL CARE MOUTH RINSE: 15.0000 mL | Freq: Once | OROMUCOSAL | Status: AC

## 2022-12-26 MED ORDER — LACTATED RINGERS IV SOLN: INTRAVENOUS | Status: DC

## 2022-12-26 MED ORDER — CHLORHEXIDINE GLUCONATE 0.12 % MT SOLN: 15.0000 mL | Freq: Once | OROMUCOSAL | Status: AC

## 2022-12-27 ENCOUNTER — Ambulatory Visit: Payer: Managed Care, Other (non HMO) | Admitting: Anesthesiology

## 2022-12-27 ENCOUNTER — Other Ambulatory Visit: Payer: Self-pay

## 2022-12-27 ENCOUNTER — Encounter
Admission: RE | Disposition: A | Discharge status: Home / Self Care | Payer: Self-pay | Source: Home / Self Care | Attending: Obstetrics and Gynecology

## 2022-12-27 ENCOUNTER — Ambulatory Visit
Admission: RE | Admit: 2022-12-27 | Discharge: 2022-12-27 | Disposition: A | Discharge status: Home / Self Care | Payer: Managed Care, Other (non HMO) | Attending: Obstetrics and Gynecology | Admitting: Obstetrics and Gynecology

## 2022-12-27 ENCOUNTER — Encounter: Payer: Self-pay | Admitting: Obstetrics and Gynecology

## 2022-12-27 DIAGNOSIS — F32A Depression, unspecified: Secondary | ICD-10-CM | POA: Diagnosis not present

## 2022-12-27 DIAGNOSIS — G4730 Sleep apnea, unspecified: Secondary | ICD-10-CM | POA: Diagnosis not present

## 2022-12-27 DIAGNOSIS — I10 Essential (primary) hypertension: Secondary | ICD-10-CM | POA: Diagnosis not present

## 2022-12-27 DIAGNOSIS — G43009 Migraine without aura, not intractable, without status migrainosus: Secondary | ICD-10-CM

## 2022-12-27 DIAGNOSIS — Z01818 Encounter for other preprocedural examination: Secondary | ICD-10-CM

## 2022-12-27 DIAGNOSIS — E876 Hypokalemia: Secondary | ICD-10-CM

## 2022-12-27 DIAGNOSIS — F17200 Nicotine dependence, unspecified, uncomplicated: Secondary | ICD-10-CM | POA: Diagnosis not present

## 2022-12-27 DIAGNOSIS — Z302 Encounter for sterilization: Secondary | ICD-10-CM

## 2022-12-27 DIAGNOSIS — Z01812 Encounter for preprocedural laboratory examination: Secondary | ICD-10-CM

## 2022-12-27 HISTORY — PX: ROBOTIC ASSISTED BILATERAL SALPINGO OOPHERECTOMY: SHX6078

## 2022-12-27 LAB — ABO/RH: ABO/RH(D): O POS

## 2022-12-27 LAB — POCT I-STAT, CHEM 8
BUN: 11 mg/dL (ref 6–20)
Calcium, Ion: 1.21 mmol/L (ref 1.15–1.40)
Chloride: 100 mmol/L (ref 98–111)
Creatinine, Ser: 1.1 mg/dL — ABNORMAL HIGH (ref 0.44–1.00)
Glucose, Bld: 95 mg/dL (ref 70–99)
HCT: 44 % (ref 36.0–46.0)
Hemoglobin: 15 g/dL (ref 12.0–15.0)
Potassium: 4.1 mmol/L (ref 3.5–5.1)
Sodium: 137 mmol/L (ref 135–145)
TCO2: 26 mmol/L (ref 22–32)

## 2022-12-27 LAB — POCT PREGNANCY, URINE: Preg Test, Ur: NEGATIVE

## 2022-12-27 SURGERY — SALPINGO-OOPHORECTOMY, BILATERAL, ROBOT-ASSISTED
Anesthesia: General | Site: Abdomen | Laterality: Bilateral

## 2022-12-27 MED ORDER — SEVOFLURANE IN SOLN
RESPIRATORY_TRACT | Status: AC
  Filled 2022-12-27: qty 250

## 2022-12-27 MED ORDER — FAMOTIDINE 20 MG PO TABS
ORAL_TABLET | ORAL | Status: AC
  Administered 2022-12-27: 20 mg via ORAL
  Filled 2022-12-27: qty 1

## 2022-12-27 MED ORDER — MIDAZOLAM HCL 2 MG/2ML IJ SOLN
INTRAMUSCULAR | Status: AC
  Filled 2022-12-27: qty 2

## 2022-12-27 MED ORDER — FENTANYL CITRATE (PF) 100 MCG/2ML IJ SOLN
INTRAMUSCULAR | Status: AC
  Filled 2022-12-27: qty 2

## 2022-12-27 MED ORDER — ROCURONIUM BROMIDE 10 MG/ML (PF) SYRINGE
PREFILLED_SYRINGE | INTRAVENOUS | Status: AC
  Filled 2022-12-27: qty 10

## 2022-12-27 MED ORDER — FENTANYL CITRATE (PF) 100 MCG/2ML IJ SOLN
25.0000 ug | INTRAMUSCULAR | Status: DC | PRN
  Administered 2022-12-27 (×3): 25 ug via INTRAVENOUS

## 2022-12-27 MED ORDER — DEXAMETHASONE SODIUM PHOSPHATE 10 MG/ML IJ SOLN
INTRAMUSCULAR | Status: DC | PRN
  Administered 2022-12-27: 10 mg via INTRAVENOUS

## 2022-12-27 MED ORDER — ACETAMINOPHEN 10 MG/ML IV SOLN
INTRAVENOUS | Status: AC
  Filled 2022-12-27: qty 100

## 2022-12-27 MED ORDER — ACETAMINOPHEN 10 MG/ML IV SOLN: 1000.0000 mg | Freq: Once | INTRAVENOUS | Status: DC | PRN

## 2022-12-27 MED ORDER — FENTANYL CITRATE (PF) 100 MCG/2ML IJ SOLN
INTRAMUSCULAR | Status: DC | PRN
  Administered 2022-12-27: 100 ug via INTRAVENOUS

## 2022-12-27 MED ORDER — SUGAMMADEX SODIUM 200 MG/2ML IV SOLN
INTRAVENOUS | Status: DC | PRN
  Administered 2022-12-27: 200 mg via INTRAVENOUS

## 2022-12-27 MED ORDER — BUPIVACAINE HCL (PF) 0.5 % IJ SOLN
INTRAMUSCULAR | Status: AC
  Filled 2022-12-27: qty 30

## 2022-12-27 MED ORDER — ROCURONIUM BROMIDE 100 MG/10ML IV SOLN
INTRAVENOUS | Status: DC | PRN
  Administered 2022-12-27: 20 mg via INTRAVENOUS
  Administered 2022-12-27: 50 mg via INTRAVENOUS

## 2022-12-27 MED ORDER — OXYCODONE HCL 5 MG PO TABS: 5.0000 mg | ORAL_TABLET | Freq: Once | ORAL | Status: AC | PRN

## 2022-12-27 MED ORDER — CEFAZOLIN SODIUM-DEXTROSE 2-3 GM-%(50ML) IV SOLR
INTRAVENOUS | Status: DC | PRN
  Administered 2022-12-27: 2 g via INTRAVENOUS

## 2022-12-27 MED ORDER — HYDROCODONE-ACETAMINOPHEN 5-325 MG PO TABS: 1.0000 | ORAL_TABLET | ORAL | 0 refills | Status: DC | PRN

## 2022-12-27 MED ORDER — BUPIVACAINE HCL 0.5 % IJ SOLN
INTRAMUSCULAR | Status: DC | PRN
  Administered 2022-12-27: 15 mL

## 2022-12-27 MED ORDER — FENTANYL CITRATE (PF) 100 MCG/2ML IJ SOLN
INTRAMUSCULAR | Status: AC
  Administered 2022-12-27: 25 ug via INTRAVENOUS
  Filled 2022-12-27: qty 2

## 2022-12-27 MED ORDER — DEXMEDETOMIDINE HCL IN NACL 80 MCG/20ML IV SOLN
INTRAVENOUS | Status: DC | PRN
  Administered 2022-12-27: 12 ug via BUCCAL
  Administered 2022-12-27: 8 ug via BUCCAL

## 2022-12-27 MED ORDER — ONDANSETRON HCL 4 MG/2ML IJ SOLN
INTRAMUSCULAR | Status: DC | PRN
  Administered 2022-12-27: 4 mg via INTRAVENOUS

## 2022-12-27 MED ORDER — MIDAZOLAM HCL 2 MG/2ML IJ SOLN
INTRAMUSCULAR | Status: DC | PRN
  Administered 2022-12-27: 2 mg via INTRAVENOUS

## 2022-12-27 MED ORDER — OXYCODONE HCL 5 MG/5ML PO SOLN: 5.0000 mg | Freq: Once | ORAL | Status: AC | PRN

## 2022-12-27 MED ORDER — ACETAMINOPHEN 10 MG/ML IV SOLN
INTRAVENOUS | Status: DC | PRN
  Administered 2022-12-27: 1000 mg via INTRAVENOUS

## 2022-12-27 MED ORDER — IBUPROFEN 600 MG PO TABS: 600.0000 mg | ORAL_TABLET | Freq: Four times a day (QID) | ORAL | 0 refills | Status: DC

## 2022-12-27 MED ORDER — LACTATED RINGERS IV SOLN: INTRAVENOUS | Status: DC

## 2022-12-27 MED ORDER — ONDANSETRON HCL 4 MG/2ML IJ SOLN: 4.0000 mg | Freq: Once | INTRAMUSCULAR | Status: DC | PRN

## 2022-12-27 MED ORDER — CEFAZOLIN SODIUM-DEXTROSE 2-4 GM/100ML-% IV SOLN
INTRAVENOUS | Status: AC
  Filled 2022-12-27: qty 100

## 2022-12-27 MED ORDER — PROPOFOL 10 MG/ML IV BOLUS
INTRAVENOUS | Status: DC | PRN
  Administered 2022-12-27: 50 mg via INTRAVENOUS
  Administered 2022-12-27: 150 ug/kg/min via INTRAVENOUS

## 2022-12-27 MED ORDER — CHLORHEXIDINE GLUCONATE 0.12 % MT SOLN
OROMUCOSAL | Status: AC
  Administered 2022-12-27: 15 mL via OROMUCOSAL
  Filled 2022-12-27: qty 15

## 2022-12-27 MED ORDER — LIDOCAINE HCL (PF) 2 % IJ SOLN
INTRAMUSCULAR | Status: AC
  Filled 2022-12-27: qty 20

## 2022-12-27 MED ORDER — OXYCODONE HCL 5 MG PO TABS
ORAL_TABLET | ORAL | Status: AC
  Administered 2022-12-27: 5 mg via ORAL
  Filled 2022-12-27: qty 1

## 2022-12-27 MED ORDER — LIDOCAINE HCL (CARDIAC) PF 100 MG/5ML IV SOSY
PREFILLED_SYRINGE | INTRAVENOUS | Status: DC | PRN
  Administered 2022-12-27 (×2): 100 mg via INTRAVENOUS

## 2022-12-27 SURGICAL SUPPLY — 55 items
ADH SKN CLS APL DERMABOND .7 (GAUZE/BANDAGES/DRESSINGS) ×1
BAG DRN RND TRDRP ANRFLXCHMBR (UROLOGICAL SUPPLIES) ×1
BAG URINE DRAIN 2000ML AR STRL (UROLOGICAL SUPPLIES) ×1 IMPLANT
CATH FOLEY 2WAY  5CC 16FR (CATHETERS) ×1
CATH FOLEY 2WAY 5CC 16FR (CATHETERS) ×1
CATH URTH 16FR FL 2W BLN LF (CATHETERS) ×1 IMPLANT
COVER TIP SHEARS 8 DVNC (MISCELLANEOUS) IMPLANT
COVER TIP SHEARS 8MM DA VINCI (MISCELLANEOUS) ×1
COVER WAND RF STERILE (DRAPES) ×1 IMPLANT
DERMABOND ADVANCED .7 DNX12 (GAUZE/BANDAGES/DRESSINGS) ×1 IMPLANT
DRAPE 3/4 80X56 (DRAPES) ×1 IMPLANT
DRAPE ARM DVNC X/XI (DISPOSABLE) ×3 IMPLANT
DRAPE COLUMN DVNC XI (DISPOSABLE) ×1 IMPLANT
DRAPE DA VINCI XI ARM (DISPOSABLE) ×3
DRAPE DA VINCI XI COLUMN (DISPOSABLE) ×1
DRAPE ROBOT W/ LEGGING 30X125 (DRAPES) ×1 IMPLANT
DRAPE UNDER BUTTOCK W/FLU (DRAPES) ×1 IMPLANT
ELECT REM PT RETURN 9FT ADLT (ELECTROSURGICAL) ×1
ELECTRODE REM PT RTRN 9FT ADLT (ELECTROSURGICAL) ×1 IMPLANT
GLOVE BIO SURGEON STRL SZ7 (GLOVE) ×2 IMPLANT
GLOVE BIOGEL PI IND STRL 7.5 (GLOVE) ×3 IMPLANT
GOWN STRL REUS W/ TWL LRG LVL3 (GOWN DISPOSABLE) ×3 IMPLANT
GOWN STRL REUS W/TWL LRG LVL3 (GOWN DISPOSABLE) ×3
GRASPER SUT TROCAR 14GX15 (MISCELLANEOUS) IMPLANT
IRRIGATION STRYKERFLOW (MISCELLANEOUS) IMPLANT
IRRIGATOR STRYKERFLOW (MISCELLANEOUS)
IV NS 1000ML (IV SOLUTION)
IV NS 1000ML BAXH (IV SOLUTION) ×1 IMPLANT
KIT PINK PAD W/HEAD ARE REST (MISCELLANEOUS) ×1
KIT PINK PAD W/HEAD ARM REST (MISCELLANEOUS) ×1 IMPLANT
LABEL OR SOLS (LABEL) ×1 IMPLANT
MANIFOLD NEPTUNE II (INSTRUMENTS) ×1 IMPLANT
NS IRRIG 1000ML POUR BTL (IV SOLUTION) ×1 IMPLANT
OBTURATOR OPTICAL STANDARD 8MM (TROCAR) ×1
OBTURATOR OPTICAL STND 8 DVNC (TROCAR) ×1
OBTURATOR OPTICALSTD 8 DVNC (TROCAR) ×1 IMPLANT
PACK LAP CHOLECYSTECTOMY (MISCELLANEOUS) ×1 IMPLANT
PAD PREP 24X41 OB/GYN DISP (PERSONAL CARE ITEMS) ×1 IMPLANT
SCRUB CHG 4% DYNA-HEX 4OZ (MISCELLANEOUS) ×1 IMPLANT
SEAL UNIV 5-12 XI (MISCELLANEOUS) ×3 IMPLANT
SEAL XI UNIVERSAL 5-12 (MISCELLANEOUS) ×3
SEALER VESSEL DA VINCI XI (MISCELLANEOUS) ×1
SEALER VESSEL EXT DVNC XI (MISCELLANEOUS) IMPLANT
SOL ELECTROSURG ANTI STICK (MISCELLANEOUS) ×1
SOLUTION ELECTROSURG ANTI STCK (MISCELLANEOUS) ×1 IMPLANT
SPONGE T-LAP 18X18 ~~LOC~~+RFID (SPONGE) IMPLANT
SURGILUBE 2OZ TUBE FLIPTOP (MISCELLANEOUS) ×1 IMPLANT
SUT MNCRL 4-0 (SUTURE) ×1
SUT MNCRL 4-0 27XMFL (SUTURE) ×1
SUT VICRYL 0 UR6 27IN ABS (SUTURE) IMPLANT
SUT VLOC 90 S/L VL9 GS22 (SUTURE) IMPLANT
SUTURE MNCRL 4-0 27XMF (SUTURE) ×1 IMPLANT
SYR 10ML LL (SYRINGE) ×1 IMPLANT
TOWEL OR 17X26 4PK STRL BLUE (TOWEL DISPOSABLE) ×1 IMPLANT
TUBING EVAC SMOKE HEATED PNEUM (TUBING) ×1 IMPLANT

## 2022-12-27 NOTE — Transfer of Care (Signed)
Immediate Anesthesia Transfer of Care Note  Patient: Hannah Gregory  Procedure(s) Performed: XI ROBOTIC ASSISTED BILATERAL SALPINGECTOMY (Bilateral: Abdomen)  Patient Location: PACU  Anesthesia Type:General  Level of Consciousness: awake, alert , and oriented  Airway & Oxygen Therapy: Patient Spontanous Breathing  Post-op Assessment: Report given to RN and Post -op Vital signs reviewed and stable  Post vital signs: stable  Last Vitals:  Vitals Value Taken Time  BP 86/68 12/27/22 0853  Temp    Pulse 65 12/27/22 0856  Resp 17 12/27/22 0856  SpO2 96 % 12/27/22 0856  Vitals shown include unvalidated device data.  Last Pain:  Vitals:   12/27/22 0631  TempSrc: Temporal  PainSc: 0-No pain         Complications: No notable events documented.

## 2022-12-27 NOTE — Anesthesia Procedure Notes (Signed)
Procedure Name: Intubation Date/Time: 12/27/2022 7:44 AM  Performed by: Patience Musca., CRNAPre-anesthesia Checklist: Patient identified, Patient being monitored, Timeout performed, Emergency Drugs available and Suction available Patient Re-evaluated:Patient Re-evaluated prior to induction Oxygen Delivery Method: Circle system utilized Preoxygenation: Pre-oxygenation with 100% oxygen Induction Type: IV induction Ventilation: Mask ventilation without difficulty Laryngoscope Size: 3 and McGraph Grade View: Grade I Tube type: Oral Tube size: 7.0 mm Number of attempts: 1 Airway Equipment and Method: Stylet Placement Confirmation: ETT inserted through vocal cords under direct vision, positive ETCO2 and breath sounds checked- equal and bilateral Secured at: 21 cm Tube secured with: Tape Dental Injury: Teeth and Oropharynx as per pre-operative assessment

## 2022-12-27 NOTE — Interval H&P Note (Signed)
History and Physical Interval Note:  12/27/2022 7:25 AM  Hannah Gregory  has presented today for surgery, with the diagnosis of sterilization.  The various methods of treatment have been discussed with the patient and family. After consideration of risks, benefits and other options for treatment, the patient has consented to  Procedure(s) with comments: XI ROBOTIC ASSISTED BILATERAL SALPINGECTOMY (Bilateral) - RNFA TO ASSIST as a surgical intervention.  The patient's history has been reviewed, patient examined, no change in status, stable for surgery.  I have reviewed the patient's chart and labs.  Questions were answered to the patient's satisfaction.  Consents reviewed and patient wishes to proceed.   Prentice Docker, MD, [facility] 12/27/2022 7:25 AM

## 2022-12-27 NOTE — Anesthesia Postprocedure Evaluation (Signed)
Anesthesia Post Note  Patient: Hannah Gregory  Procedure(s) Performed: XI ROBOTIC ASSISTED BILATERAL SALPINGECTOMY (Bilateral: Abdomen)  Patient location during evaluation: PACU Anesthesia Type: General Level of consciousness: awake and alert, oriented and patient cooperative Pain management: pain level controlled Vital Signs Assessment: post-procedure vital signs reviewed and stable Respiratory status: spontaneous breathing, nonlabored ventilation and respiratory function stable Cardiovascular status: blood pressure returned to baseline and stable Postop Assessment: adequate PO intake Anesthetic complications: no   There were no known notable events for this encounter.   Last Vitals:  Vitals:   12/27/22 0935 12/27/22 0955  BP:  117/85  Pulse: (!) 49 63  Resp: 16 16  Temp:  (!) 36.1 C  SpO2: 92% 96%    Last Pain:  Vitals:   12/27/22 0955  TempSrc: Temporal  PainSc: 2                  Darrin Nipper

## 2022-12-27 NOTE — Discharge Instructions (Signed)
AMBULATORY SURGERY  ?DISCHARGE INSTRUCTIONS ? ? ?The drugs that you were given will stay in your system until tomorrow so for the next 24 hours you should not: ? ?Drive an automobile ?Make any legal decisions ?Drink any alcoholic beverage ? ? ?You may resume regular meals tomorrow.  Today it is better to start with liquids and gradually work up to solid foods. ? ?You may eat anything you prefer, but it is better to start with liquids, then soup and crackers, and gradually work up to solid foods. ? ? ?Please notify your doctor immediately if you have any unusual bleeding, trouble breathing, redness and pain at the surgery site, drainage, fever, or pain not relieved by medication. ? ? ? ?Additional Instructions: ? ? ? ?Please contact your physician with any problems or Same Day Surgery at [phone], Monday through Friday 6 am to 4 pm, or [facility] number at [phone].  ?

## 2022-12-27 NOTE — Op Note (Signed)
Operative Note    Name: Hannah Gregory  Date of Service: 12/27/2022  DOB: 03/17/1981  MRN: [id number]   Pre-Operative Diagnosis: Multiparity, desires permanent sterilization  Post-Operative Diagnosis: Multiparity, desires permanent sterilization  Procedures:  Robot assisted laparoscopic bilateral salpingectomy  Primary Surgeon: Prentice Docker, MD   EBL: 3 mL (minimal)  IVF: 500 mL   Urine output: 350 mL  Specimens: bilateral fallopian tubes  Drains: none  Complications: None   Disposition: PACU   Condition: Stable   Findings:  1) Mild adhesions of lower uterine segment 2) Otherwise, normal appearing uterus, bilateral fallopian tubes, and ovaries  Procedure Summary:  The patient was taken to the operating room where general anesthesia was administered and found to be adequate. She was placed in the dorsal supine lithotomy position in [place] stirrups and prepped and draped in the usual sterile fashion. After a timeout was called an indwelling catheter was placed in her bladder.  A sponge on a stick was placed in her vagina for uterine manipulation.  Attention was turned to the abdomen where after injection of local anesthetic, an 8 mm infraumbilical incision was made with the scalpel. Entry into the abdomen was obtained via Optiview trocar technique (a blunt entry technique with camera visualization through the obturator upon entry). Verification of entry into the abdomen was obtained using opening pressures. The abdomen was insufflated with CO2. The camera was introduced through the trocar with verification of atraumatic entry.  Right and left abdominal entry sites were created after injection of local anesthetic about 8 cm away from the umbilical port in accordance with the Intuitive manufacturer's recommendations.  The port sites were 8 mm.  The intuitive trochars were introduced under intra-abdominal camera visualization without difficulty.  The XI robot was docked on the  patient's left.  Clearance was verified from the patient's legs.  Through the umbilical port the camera was placed.  Through the port attached to arm 4 the forced bipolar forceps were placed.  Through the port attached to arm 2 the vessel sealer was was placed.    After the above survey, the right ureter was visualized and found to be well away from the operative area of interest.  The right fallopian tube was grasped at the fimbriated end and using the vessel sealer, the mesosalpinx was cauterized and transected in a lateral to medial fashion.  Near the cornu the fallopian tube was grasped, cauterized, and transected.  The specimen was placed in the anterior cul-de-sac.  The left ureter was visualized and the same procedure to remove the fallopian tube was carried out on the left side without difficulty.  The intra-abdominal pressure was lowered to 5 mmHg and ongoing hemostasis was noted.  The specimens were passed through the right lateral port without difficulty.  The instruments were removed.  The robot was undocked and removed from the patient bedside.  The abdomen was emptied of CO2 with the aid of 5 deep breaths from anesthesia.  All trocars removed using the trocar cleaner to verify that no viscera were brought in to the abdominal wall incision tracts.  All incision sites closed with 4-0 Monocryl and reinforced using surgical skin glue.  Attention was turned to the pelvis.  The sponge stick was removed.  The Foley catheter was removed.  A digital sweep of the vagina was performed to ensure that no instruments or sponges remained in the vagina.  The patient tolerated the procedure well.  Sponge, lap, needle, and instrument counts were correct x  2.  VTE prophylaxis: SCDs. Antibiotic prophylaxis: none indicated, though she had none ordered, she was given Ancef 2 gram IV prior to the start of the case. She was awakened in the operating room and was taken to the PACU in stable condition.   Prentice Docker, MD 12/27/2022 8:44 AM

## 2022-12-27 NOTE — Anesthesia Preprocedure Evaluation (Addendum)
Anesthesia Evaluation  Patient identified by MRN, date of birth, ID band Patient awake    Reviewed: Allergy & Precautions, NPO status , Patient's Chart, lab work & pertinent test results  History of Anesthesia Complications Negative for: history of anesthetic complications  Airway Mallampati: I   Neck ROM: Full    Dental no notable dental hx.    Pulmonary sleep apnea , Current Smoker (1/2 ppd) and Patient abstained from smoking.   Pulmonary exam normal breath sounds clear to auscultation       Cardiovascular hypertension, Normal cardiovascular exam Rhythm:Regular Rate:Normal  Echo 12/04/22:  NORMAL LEFT VENTRICULAR SYSTOLIC FUNCTION  NORMAL RIGHT VENTRICULAR SYSTOLIC FUNCTION  MILD VALVULAR REGURGITATION  NO VALVULAR STENOSIS  MILD to MODERATE AR  MILD MR, TR, PR  EF 50-55%   ECG 12/17/22:  Sinus bradycardia Otherwise normal ECG When compared with ECG of 08-Sep-2020 03:30, No significant change was found   Neuro/Psych  Headaches PSYCHIATRIC DISORDERS  Depression       GI/Hepatic negative GI ROS,,,  Endo/Other  negative endocrine ROS    Renal/GU Renal disease (nephrolithiasis)     Musculoskeletal   Abdominal   Peds  Hematology negative hematology ROS (+)   Anesthesia Other Findings Cardiology note 11/22/22:  42 y.o. female with  1. Irregular heart beat  2. Heart palpitations  3. Tachycardia  4. Essential hypertension  5. Labile essential hypertension  6. History of anxiety   Plan   Irregular heartbeat, recommend echo and 72 hour Holter Tachycardia, palpitations, regular ETT, 72 Holter, and echo for further evaluation.  Hypertension, well controlled, BP is 122/78, continue amlodipine and atenolol-chlorthalidone H/o anxiety, recommend reassurance, follow up with PCP Have patient follow up in 2 months  Return in about 2 months (around 01/21/2023).    Reproductive/Obstetrics                              Anesthesia Physical Anesthesia Plan  ASA: 2  Anesthesia Plan: General   Post-op Pain Management:    Induction: Intravenous  PONV Risk Score and Plan: 2 and Ondansetron, Dexamethasone and Treatment may vary due to age or medical condition  Airway Management Planned: Oral ETT  Additional Equipment:   Intra-op Plan:   Post-operative Plan: Extubation in OR  Informed Consent: I have reviewed the patients History and Physical, chart, labs and discussed the procedure including the risks, benefits and alternatives for the proposed anesthesia with the patient or authorized representative who has indicated his/her understanding and acceptance.     Dental advisory given  Plan Discussed with: CRNA  Anesthesia Plan Comments: (Patient consented for risks of anesthesia including but not limited to:  - adverse reactions to medications - damage to eyes, teeth, lips or other oral mucosa - nerve damage due to positioning  - sore throat or hoarseness - damage to heart, brain, nerves, lungs, other parts of body or loss of life  Informed patient about role of CRNA in peri- and intra-operative care.  Patient voiced understanding.)        Anesthesia Quick Evaluation

## 2022-12-28 ENCOUNTER — Encounter: Payer: Self-pay | Admitting: Obstetrics and Gynecology

## 2022-12-28 LAB — SURGICAL PATHOLOGY

## 2023-01-11 ENCOUNTER — Other Ambulatory Visit: Payer: Self-pay | Admitting: Obstetrics and Gynecology

## 2023-01-11 DIAGNOSIS — G8918 Other acute postprocedural pain: Secondary | ICD-10-CM

## 2023-01-22 ENCOUNTER — Ambulatory Visit
Admission: RE | Admit: 2023-01-22 | Discharge: 2023-01-22 | Disposition: A | Discharge status: Home / Self Care | Payer: Managed Care, Other (non HMO) | Source: Ambulatory Visit | Attending: Obstetrics and Gynecology | Admitting: Obstetrics and Gynecology

## 2023-01-22 DIAGNOSIS — G8918 Other acute postprocedural pain: Secondary | ICD-10-CM

## 2023-01-22 MED ORDER — IOPAMIDOL (ISOVUE-300) INJECTION 61%
100.0000 mL | Freq: Once | INTRAVENOUS | Status: AC | PRN
  Administered 2023-01-22: 100 mL via INTRAVENOUS

## 2023-02-04 ENCOUNTER — Other Ambulatory Visit: Payer: Self-pay | Admitting: Obstetrics and Gynecology

## 2023-02-05 NOTE — H&P (Signed)
Preoperative History and Physical   Chief Complaint: Hannah Gregory is a 42 y.o. [id number] here for surgical management of pelvic pain and suspected hematometa.   No significant preoperative concerns.   History of Present Illness: 42 y.o. [id number] female who underwent laparoscopic bilateral salpingectomy in late March. Since surgery she has had lower abdominal pain.  A CT scan showed a collection of fluid within the uterus measuring about 2 cm.  She also has had had menstruation since an AB in October, even after a forced menstruation with Provera. She continues to have the pain.  She has had no new symptoms (fevers, chills).     Proposed surgery: hysteroscopy, dilation and curettage (ultrasound guided)   Past Medical History      Past Medical History:  Diagnosis Date   Anxiety     Arthralgia of both hands 05/2021   B12 deficiency     BV (bacterial vaginosis) 07/2022   Cervicalgia     Chronic diarrhea 2022   Chronic rhinitis     COVID-19 06/2020   Dyslipidemia     Family history of breast cancer      maternal aunts, MGM   Family history of pernicious anemia     Fatty liver     Gonorrhea 01/2022   History of kidney stones     Labile essential hypertension 11/2021   MDD (major depressive disorder)     Menometrorrhagia 2020    use of Depo injection   Migraine headache     Pancreatic insufficiency (HHS-HCC) 01/2022   Recurrent cold sores     Recurrent pansinusitis     Renal colic on left side 05/2017   Scoliosis of thoracolumbar spine 11/2016   Sleep apnea 11/30/2022   Tobacco use disorder     Ureterolithiasis 05/2017   Vision disturbance     Vitamin D deficiency        Past Surgical History       Past Surgical History:  Procedure Laterality Date   LAPAROSCOPIC CHOLECYSTECTOMY   08/2020    robotic assisted by isami sakai   XI ROBOTIC ASSISTED BILATERAL SALPINGECTOMY   12/27/2022    Dr. Thomasene Mohair                       OB History  Gravida Para Term Preterm AB  Living  5 3 3   2 3   SAB IAB Ectopic Molar Multiple Live Births             3     # Outcome Date GA Lbr Len/2nd Weight Sex Type Anes PTL Lv  5 AB 07/2022                  4 AB 2019                  3 Term                 LIV  2 Term                 LIV  1 Term                 LIV  Patient denies any other pertinent gynecologic issues.    Medications Ordered Prior to Encounter        Current Outpatient Medications on File Prior to Visit  Medication Sig Dispense Refill   amLODIPine (NORVASC) 2.5 MG tablet Take 2.5 mg by mouth once daily  ascorbic acid, vitamin C, (VITAMIN C) 500 MG tablet Take 500 mg by mouth once daily       atenoloL-chlorthalidone (TENORETIC) 100-25 mg tablet Take 1 tablet by mouth once daily       baclofen (LIORESAL) 5 mg tablet Take 5-10mg  nightly for headaches 60 tablet 0   BOTOX 200 unit SolR every 3 (three) months       busPIRone (BUSPAR) 5 MG tablet Take 5 mg by mouth 2 (two) times daily       cyanocobalamin (VITAMIN B12) 1,000 mcg/mL injection Take 1ml once a week for four weeks. (Patient taking differently: monthly Take 1ml monthly.) 1 mL 3   fenofibrate nanocrystallized (TRICOR) 145 MG tablet Take by mouth       galcanezumab-gnlm (EMGALITY PEN) 120 mg/mL PnIj Inject 1 mL subcutaneously monthly 1 mL 11   hydrALAZINE (APRESOLINE) 25 MG tablet Take 25 mg by mouth 3 (three) times daily       ipratropium (ATROVENT) 21 mcg (0.03 %) nasal spray Place 2 sprays into both nostrils 2 (two) times daily 2 sprays in each nostril 2 or 3 times a day 30 mL 0   meloxicam (MOBIC) 15 MG tablet Take 1 tablet (15 mg total) by mouth once daily 10 tablet 0   omeg3/dha/epa/fish oil/L.casei (RESTORA ORAL) Take by mouth       VITAMIN D3 ORAL Take 2,000 Units by mouth       benzonatate (TESSALON) 200 MG capsule Take 1 capsule (200 mg total) by mouth 3 (three) times daily as needed for Cough (Patient not taking: Reported on 01/24/2023) 30 capsule 1   medroxyPROGESTERone (PROVERA) 10  MG tablet Take 1 tablet (10 mg total) by mouth once daily for 10 days (Patient not taking: Reported on 01/24/2023) 10 tablet 0   oxyCODONE-acetaminophen (PERCOCET) 5-325 mg tablet Take 1 tablet by mouth every 6 (six) hours as needed for Pain (breakthrough pain) (Patient not taking: Reported on 01/24/2023) 12 tablet 0   pancrelipase (CREON) 36,000-114,000-180,000 unit DR capsule Take by mouth 3 (three) times daily with meals (Patient not taking: Reported on 01/24/2023)       promethazine-dextromethorphan (PROMETHAZINE-DM) 6.25-15 mg/5 mL syrup Take 5 mLs by mouth every 6 (six) hours as needed for Cough Maximum 30 mL / 24-hour (Patient not taking: Reported on 01/24/2023) 120 mL 0   venlafaxine (EFFEXOR) 75 MG tablet Take 3 tablets (225 mg total) by mouth once daily for 30 days 270 tablet 1    No current facility-administered medications on file prior to visit.      Allergies       Allergies  Allergen Reactions   Sumatriptan Other (See Comments)      Side effect , chest pain, flushed    Prednisone Hives and Other (See Comments)      Numbness of her face   Varenicline Tartrate Nausea      Chantix   Aripiprazole Nausea        Social History:   reports that she has been smoking cigarettes. She has been exposed to tobacco smoke. She has never used smokeless tobacco. She reports current alcohol use. She reports that she does not use drugs.   Family History        Family History  Problem Relation Name Age of Onset   Asthma Mother Jasmine December     Migraines Mother Jasmine December     High blood pressure (Hypertension) Mother Jasmine December     Hyperlipidemia (Elevated cholesterol) Mother Jasmine December  Thyroid disease Mother Jasmine December     High blood pressure (Hypertension) Father Fredrik Cove     Stroke Father Fredrik Cove     Migraines Father Roger     Hyperlipidemia (Elevated cholesterol) Father Roger     Myocardial Infarction (Heart attack) Father Roger     High blood pressure (Hypertension) Brother Sheppard Coil     Diabetes Brother  Sheppard Coil     COPD Brother Sheppard Coil     Emphysema Brother Sheppard Coil     Mental retardation Brother Feliz Beam     Breast cancer Maternal Grandmother       Cancer Paternal Grandmother       Breast cancer Maternal Aunt       Breast cancer Maternal Aunt            Review of Systems: Noncontributory   PHYSICAL EXAM: Blood pressure (!) 147/93, pulse 77, weight 80.8 kg (178 lb 3.2 oz). CONSTITUTIONAL: Well-developed, well-nourished female in no acute distress.  HENT:  Normocephalic, atraumatic, External right and left ear normal. Oropharynx is clear and moist EYES: Conjunctivae and EOM are normal. Pupils are equal, round, and reactive to light. No scleral icterus.  NECK: Normal range of motion, supple, no masses SKIN: Skin is warm and dry. No rash noted. Not diaphoretic. No erythema. No pallor. NEUROLGIC: Alert and oriented to person, place, and time. Normal reflexes, muscle tone coordination. No cranial nerve deficit noted. PSYCHIATRIC: Normal mood and affect. Normal behavior. Normal judgment and thought content. CARDIOVASCULAR: Normal heart rate noted, regular rhythm RESPIRATORY: Effort and breath sounds normal, no problems with respiration noted ABDOMEN: Soft, nontender, nondistended. PELVIC: Deferred MUSCULOSKELETAL: Normal range of motion. No edema and no tenderness. 2+ distal pulses.   Labs: Recent Results  No results found for this or any previous visit (from the past 336 hour(s)).     Imaging Studies: No results found.   Assessment: 1. Hematometra     Plan: Patient will undergo surgical management with the above-noted surgery.   The risks of surgery were discussed in detail with the patient including but not limited to: bleeding which may require transfusion or reoperation; infection which may require antibiotics; injury to surrounding organs which may involve bowel, bladder, ureters ; need for additional procedures including laparoscopy or laparotomy; thromboembolic phenomenon,  surgical site problems and other postoperative/anesthesia complications. Likelihood of success in alleviating the patient's condition was discussed. Routine postoperative instructions will be reviewed with the patient and her family in detail after surgery.  The patient concurred with the proposed plan, giving informed written consent for the surgery.   Preoperative prophylactic antibiotics, as indicated, and SCDs ordered on call to the OR.       Attestation Statement:    I personally performed the service. (TP)   Dallas Torok Teola Bradley, MD  [facility] 02/05/2023 9:28 AM

## 2023-02-07 ENCOUNTER — Inpatient Hospital Stay: Admission: RE | Admit: 2023-02-07 | Payer: Managed Care, Other (non HMO) | Source: Ambulatory Visit

## 2023-02-07 ENCOUNTER — Other Ambulatory Visit: Payer: Self-pay

## 2023-02-07 ENCOUNTER — Encounter
Admission: RE | Admit: 2023-02-07 | Discharge: 2023-02-07 | Disposition: A | Discharge status: Home / Self Care | Payer: Managed Care, Other (non HMO) | Source: Ambulatory Visit | Attending: Obstetrics and Gynecology | Admitting: Obstetrics and Gynecology

## 2023-02-07 DIAGNOSIS — Z01812 Encounter for preprocedural laboratory examination: Secondary | ICD-10-CM

## 2023-02-07 HISTORY — DX: Anxiety disorder, unspecified: F41.9

## 2023-02-07 HISTORY — DX: Anemia, unspecified: D64.9

## 2023-02-07 HISTORY — DX: Depression, unspecified: F32.A

## 2023-02-07 HISTORY — DX: Unspecified osteoarthritis, unspecified site: M19.90

## 2023-02-07 HISTORY — DX: Pneumonia, unspecified organism: J18.9

## 2023-02-07 HISTORY — DX: Cardiac arrhythmia, unspecified: I49.9

## 2023-02-07 NOTE — Patient Instructions (Addendum)
Your procedure is scheduled on: 02/11/23 - Monday Report to the Registration Desk on the 1st floor of the [facility]. To find out your arrival time, please call [phone] between 1PM - 3PM on: 02/08/23 - Friday If your arrival time is 6:00 am, do not arrive before that time as the [facility] entrance doors do not open until 6:00 am.  REMEMBER: Instructions that are not followed completely may result in serious medical risk, up to and including death; or upon the discretion of your surgeon and anesthesiologist your surgery may need to be rescheduled.  Do not eat food after midnight the night before surgery.  No gum chewing or hard candies.  You may however, drink CLEAR liquids up to 2 hours before you are scheduled to arrive for your surgery. Do not drink anything within 2 hours of your scheduled arrival time.  Clear liquids include: - water  - apple juice without pulp - gatorade (not RED colors) - black coffee or tea (Do NOT add milk or creamers to the coffee or tea) Do NOT drink anything that is not on this list.  In addition, your doctor has ordered for you to drink the provided:  Ensure Pre-Surgery Clear Carbohydrate Drink  Drinking this carbohydrate drink up to two hours before surgery helps to reduce insulin resistance and improve patient outcomes. Please complete drinking 2 hours before scheduled arrival time.  One week prior to surgery: Stop Anti-inflammatories (NSAIDS) such as Advil, Aleve, Ibuprofen, Motrin, Naproxen, Naprosyn and Aspirin based products such as Excedrin, Goody's Powder, BC Powder.  Stop ANY OVER THE COUNTER supplements until after surgery.  You may however, continue to take Tylenol if needed for pain up until the day of surgery.  TAKE ONLY THESE MEDICATIONS THE MORNING OF SURGERY WITH A SIP OF WATER:    venlafaxine XR (EFFEXOR-XR)    No Alcohol for 24 hours before or after surgery.  No Smoking including e-cigarettes for 24 hours before  surgery.  No chewable tobacco products for at least 6 hours before surgery.  No nicotine patches on the day of surgery.  Do not use any "recreational" drugs for at least a week (preferably 2 weeks) before your surgery.  Please be advised that the combination of cocaine and anesthesia may have negative outcomes, up to and including death. If you test positive for cocaine, your surgery will be cancelled.  On the morning of surgery brush your teeth with toothpaste and water, you may rinse your mouth with mouthwash if you wish. Do not swallow any toothpaste or mouthwash.  Do not wear jewelry, make-up, hairpins, clips or nail polish.  Do not wear lotions, powders, or perfumes.   Do not shave body hair from the neck down 48 hours before surgery.  Contact lenses, hearing aids and dentures may not be worn into surgery.  Do not bring valuables to the hospital. [facility] is not responsible for any missing/lost belongings or valuables.   Bring your C-PAP to the hospital in case you may have to spend the night.   Notify your doctor if there is any change in your medical condition (cold, fever, infection).  Wear comfortable clothing (specific to your surgery type) to the hospital.  After surgery, you can help prevent lung complications by doing breathing exercises.  Take deep breaths and cough every 1-2 hours. Your doctor may order a device called an Incentive Spirometer to help you take deep breaths. When coughing or sneezing, hold a pillow firmly against your incision  with both hands. This is called "splinting." Doing this helps protect your incision. It also decreases belly discomfort.  If you are being admitted to the hospital overnight, leave your suitcase in the car. After surgery it may be brought to your room.  In case of increased patient census, it may be necessary for you, the patient, to continue your postoperative care in the Same Day Surgery department.  If you are being  discharged the day of surgery, you will not be allowed to drive home. You will need a responsible individual to drive you home and stay with you for 24 hours after surgery.   If you are taking public transportation, you will need to have a responsible individual with you.  Please call the Pre-admissions Testing Dept. at [phone] if you have any questions about these instructions.  Surgery Visitation Policy:  Patients having surgery or a procedure may have two visitors.  Children under the age of 35 must have an adult with them who is not the patient.  Inpatient Visitation:    Visiting hours are 7 a.m. to 8 p.m. Up to four visitors are allowed at one time in a patient room. The visitors may rotate out with other people during the day.  One visitor age 57 or older may stay with the patient overnight and must be in the room by 8 p.m.

## 2023-02-08 ENCOUNTER — Other Ambulatory Visit: Payer: Managed Care, Other (non HMO)

## 2023-02-11 ENCOUNTER — Ambulatory Visit
Admission: RE | Admit: 2023-02-11 | Payer: Managed Care, Other (non HMO) | Source: Ambulatory Visit | Admitting: Obstetrics and Gynecology

## 2023-02-11 ENCOUNTER — Encounter: Admission: RE | Payer: Self-pay | Source: Ambulatory Visit

## 2023-02-11 DIAGNOSIS — N857 Hematometra: Secondary | ICD-10-CM

## 2023-02-11 SURGERY — DILATATION AND CURETTAGE /HYSTEROSCOPY
Anesthesia: Choice

## 2023-04-01 ENCOUNTER — Other Ambulatory Visit (HOSPITAL_COMMUNITY)
Admission: RE | Admit: 2023-04-01 | Discharge: 2023-04-01 | Disposition: A | Discharge status: Home / Self Care | Payer: Managed Care, Other (non HMO) | Source: Ambulatory Visit | Attending: Internal Medicine | Admitting: Internal Medicine

## 2023-04-01 ENCOUNTER — Encounter: Payer: Self-pay | Admitting: Internal Medicine

## 2023-04-01 ENCOUNTER — Encounter: Payer: Self-pay | Admitting: Family Medicine

## 2023-04-01 ENCOUNTER — Telehealth: Payer: Self-pay | Admitting: Family Medicine

## 2023-04-01 ENCOUNTER — Ambulatory Visit: Payer: Managed Care, Other (non HMO) | Admitting: Internal Medicine

## 2023-04-01 VITALS — BP 120/82 | HR 108 | Temp 98.0°F | Resp 18 | Ht 66.0 in | Wt 173.4 lb

## 2023-04-01 DIAGNOSIS — F419 Anxiety disorder, unspecified: Secondary | ICD-10-CM | POA: Diagnosis not present

## 2023-04-01 DIAGNOSIS — N898 Other specified noninflammatory disorders of vagina: Secondary | ICD-10-CM

## 2023-04-01 DIAGNOSIS — Z113 Encounter for screening for infections with a predominantly sexual mode of transmission: Secondary | ICD-10-CM

## 2023-04-01 DIAGNOSIS — B379 Candidiasis, unspecified: Secondary | ICD-10-CM | POA: Diagnosis not present

## 2023-04-01 DIAGNOSIS — N760 Acute vaginitis: Secondary | ICD-10-CM

## 2023-04-01 DIAGNOSIS — B9689 Other specified bacterial agents as the cause of diseases classified elsewhere: Secondary | ICD-10-CM

## 2023-04-01 MED ORDER — HYDROXYZINE HCL 10 MG PO TABS
10.0000 mg | ORAL_TABLET | Freq: Every evening | ORAL | 0 refills | Status: DC | PRN
Start: 2023-04-01 — End: 2023-04-30

## 2023-04-01 NOTE — Telephone Encounter (Signed)
Revised message. Pt was not seen on 04-01-2023 due to appt went to the wrong date. Had to refer pt to see ERIN Mecum for an appt due to [place] having no avail.

## 2023-04-01 NOTE — Progress Notes (Signed)
   Acute Office Visit  Subjective:     Patient ID: Hannah Gregory, female    DOB: Jun 13, 1981, 42 y.o.   MRN: [id number]  Chief Complaint  Patient presents with   Vaginal Discharge    And itching for several days    HPI Patient is in today for STD screening.  Patient has a new sexual partner and in the last few days has noted change in vaginal discharge.  She is having vaginal itching with thick white discharge as well. Patient also wanting something as needed for anxiety due to increase stress related to vaginal symptoms.  STD SCREENING Sexual activity:  Recent unprotected sexual encounter Contraception: no Recent unprotected intercourse: yes History of sexually transmitted diseases: yes Previous sexually transmitted disease screening: yes One new female sexual partner Genital lesions: no Vaginal discharge: yes Dysuria: no Swollen lymph nodes: no Fevers: no Rash: no   Review of Systems  Constitutional:  Negative for chills and fever.  Genitourinary:  Negative for dysuria, frequency, hematuria and urgency.  Skin:  Negative for rash.        Objective:    BP 120/82   Pulse (!) 108   Temp 98 F (36.7 C)   Resp 18   Ht 5\' 6"  (1.676 m)   SpO2 99%   BMI 28.25 kg/m  BP Readings from Last 3 Encounters:  04/01/23 120/82  12/27/22 117/85  12/13/22 (!) 159/110   Wt Readings from Last 3 Encounters:  04/01/23 173 lb 6.4 oz (78.7 kg)  12/27/22 175 lb 0.7 oz (79.4 kg)  12/17/22 175 lb (79.4 kg)      Physical Exam Constitutional:      Appearance: Normal appearance.  HENT:     Head: Normocephalic and atraumatic.  Eyes:     Conjunctiva/sclera: Conjunctivae normal.  Cardiovascular:     Rate and Rhythm: Normal rate and regular rhythm.  Pulmonary:     Effort: Pulmonary effort is normal.     Breath sounds: Normal breath sounds.  Skin:    General: Skin is warm and dry.  Neurological:     General: No focal deficit present.     Mental Status: She is alert. Mental  status is at baseline.  Psychiatric:        Mood and Affect: Mood normal.        Behavior: Behavior normal.     No results found for any visits on 04/01/23.      Assessment & Plan:   1. Screening examination for STD (sexually transmitted disease)/Vaginal discharge: Screening today with cervical swab. Will also screen for HIV/RPR.   - HIV antibody (with reflex) - RPR - Cervicovaginal ancillary only  2. Anxiety: Already on maximum dose of Effexor, will add Hydroxyzine to take as needed for anxiety.   - hydrOXYzine (ATARAX) 10 MG tablet; Take 1 tablet (10 mg total) by mouth at bedtime as needed for anxiety.  Dispense: 30 tablet; Refill: 0   Return if symptoms worsen or fail to improve.  Margarita Mail, DO

## 2023-04-01 NOTE — Telephone Encounter (Signed)
Appt made for 04-01-2023

## 2023-04-01 NOTE — Telephone Encounter (Signed)
Pt is calling to request an STI appt. Pt states that she was advised that this can be a nurse visit instead of a visit with a provider. Please advise CB- [phone]

## 2023-04-02 LAB — RPR: RPR Ser Ql: NONREACTIVE

## 2023-04-02 LAB — HIV ANTIBODY (ROUTINE TESTING W REFLEX): HIV 1&2 Ab, 4th Generation: NONREACTIVE

## 2023-04-03 LAB — CERVICOVAGINAL ANCILLARY ONLY
Bacterial Vaginitis (gardnerella): POSITIVE — AB
Candida Glabrata: NEGATIVE
Candida Vaginitis: POSITIVE — AB
Chlamydia: NEGATIVE
Comment: NEGATIVE
Comment: NEGATIVE
Comment: NEGATIVE
Comment: NEGATIVE
Comment: NEGATIVE
Comment: NORMAL
Neisseria Gonorrhea: NEGATIVE
Trichomonas: NEGATIVE

## 2023-04-05 MED ORDER — FLUCONAZOLE 150 MG PO TABS
150.0000 mg | ORAL_TABLET | Freq: Once | ORAL | 0 refills | Status: AC
Start: 2023-04-05 — End: 2023-04-05

## 2023-04-05 MED ORDER — METRONIDAZOLE 500 MG PO TABS
500.0000 mg | ORAL_TABLET | Freq: Two times a day (BID) | ORAL | 0 refills | Status: AC
Start: 2023-04-05 — End: 2023-04-12

## 2023-04-05 NOTE — Addendum Note (Signed)
Addended by: Margarita Mail on: 04/05/2023 08:07 AM   Modules accepted: Orders

## 2023-04-28 ENCOUNTER — Other Ambulatory Visit: Payer: Self-pay | Admitting: Internal Medicine

## 2023-04-28 DIAGNOSIS — F419 Anxiety disorder, unspecified: Secondary | ICD-10-CM

## 2023-04-29 ENCOUNTER — Ambulatory Visit: Payer: Managed Care, Other (non HMO) | Admitting: Nurse Practitioner

## 2023-04-30 NOTE — Telephone Encounter (Signed)
Requested Prescriptions  Pending Prescriptions Disp Refills   hydrOXYzine (ATARAX) 10 MG tablet [Pharmacy Med Name: HYDROXYZINE HCL 10 MG TABLET] 30 tablet 0    Sig: TAKE 1 TABLET (10 MG TOTAL) BY MOUTH AT BEDTIME AS NEEDED FOR ANXIETY     Ear, Nose, and Throat:  Antihistamines 2 Failed - 04/28/2023  9:41 AM      Failed - Cr in normal range and within 360 days    Creat  Date Value Ref Range Status  03/09/2022 0.98 (H) 0.50 - 0.97 mg/dL Final   Creatinine, Ser  Date Value Ref Range Status  12/27/2022 1.10 (H) 0.44 - 1.00 mg/dL Final   Creatinine, Urine  Date Value Ref Range Status  02/12/2022 224 20 - 275 mg/dL Final         Passed - Valid encounter within last 12 months    Recent Outpatient Visits           4 weeks ago Screening examination for STD (sexually transmitted disease)   [facility] Margarita Mail, DO   5 months ago Major depression in remission [facility])    [facility] Alba Cory, MD   9 months ago STD exposure   [facility] Berniece Salines, FNP   9 months ago Upper respiratory tract infection, unspecified type   [facility] Margarita Mail, DO   9 months ago Upper respiratory tract infection, unspecified type   [facility] Margarita Mail, [place]

## 2023-05-08 ENCOUNTER — Ambulatory Visit: Payer: Managed Care, Other (non HMO) | Admitting: Family Medicine

## 2023-09-16 ENCOUNTER — Ambulatory Visit: Payer: Managed Care, Other (non HMO) | Admitting: Nurse Practitioner

## 2023-09-16 NOTE — Progress Notes (Unsigned)
   There were no vitals taken for this visit.   Subjective:    Patient ID: Hannah Gregory, female    DOB: 1980-12-18, 42 y.o.   MRN: [id number]  HPI: Hannah Gregory is a 41 y.o. female  No chief complaint on file.   Discussed the use of AI scribe software for clinical note transcription with the patient, who gave verbal consent to proceed.  History of Present Illness           04/01/2023    2:02 PM 11/07/2022    8:16 AM 07/24/2022    2:40 PM  Depression screen PHQ 2/9  Decreased Interest 0 0 0  Down, Depressed, Hopeless 0 0 0  PHQ - 2 Score 0 0 0  Altered sleeping 0 3   Tired, decreased energy 0 0   Change in appetite 0 0   Feeling bad or failure about yourself  0 0   Trouble concentrating 0 0   Moving slowly or fidgety/restless 0 0   Suicidal thoughts 0 0   PHQ-9 Score 0 3   Difficult doing work/chores Not difficult at all      Relevant past medical, surgical, family and social history reviewed and updated as indicated. Interim medical history since our last visit reviewed. Allergies and medications reviewed and updated.  Review of Systems  Per HPI unless specifically indicated above     Objective:    There were no vitals taken for this visit.  {Vitals History (Optional):23777} Wt Readings from Last 3 Encounters:  04/01/23 173 lb 6.4 oz (78.7 kg)  12/27/22 175 lb 0.7 oz (79.4 kg)  12/17/22 175 lb (79.4 kg)    Physical Exam  Results for orders placed or performed in visit on 04/01/23  Cervicovaginal ancillary only   Collection Time: 04/01/23  2:08 PM  Result Value Ref Range   Neisseria Gonorrhea Negative    Chlamydia Negative    Trichomonas Negative    Bacterial Vaginitis (gardnerella) Positive (A)    Candida Vaginitis Positive (A)    Candida Glabrata Negative    Comment      Normal Reference Range Bacterial Vaginosis - Negative   Comment Normal Reference Range Candida Species - Negative    Comment Normal Reference Range Candida Galbrata - Negative     Comment Normal Reference Range Trichomonas - Negative    Comment Normal Reference Ranger Chlamydia - Negative    Comment      Normal Reference Range Neisseria Gonorrhea - Negative  HIV antibody (with reflex)   Collection Time: 04/01/23  2:23 PM  Result Value Ref Range   HIV 1&2 Ab, 4th Generation NON-REACTIVE NON-REACTIVE  RPR   Collection Time: 04/01/23  2:23 PM  Result Value Ref Range   RPR Ser Ql NON-REACTIVE NON-REACTIVE   {Labs (Optional):23779}    Assessment & Plan:   Problem List Items Addressed This Visit   None    Assessment and Plan             Follow up plan: No follow-ups on file.

## 2023-10-16 ENCOUNTER — Ambulatory Visit: Payer: Self-pay

## 2023-11-18 ENCOUNTER — Ambulatory Visit: Payer: Self-pay | Admitting: Family Medicine

## 2023-11-18 ENCOUNTER — Other Ambulatory Visit (HOSPITAL_COMMUNITY)
Admission: RE | Admit: 2023-11-18 | Discharge: 2023-11-18 | Disposition: A | Discharge status: Home / Self Care | Payer: Self-pay | Source: Ambulatory Visit | Attending: Family Medicine | Admitting: Family Medicine

## 2023-11-18 ENCOUNTER — Encounter: Payer: Self-pay | Admitting: Family Medicine

## 2023-11-18 VITALS — BP 128/74 | HR 92 | Resp 16 | Ht 66.0 in | Wt 173.0 lb

## 2023-11-18 DIAGNOSIS — Z113 Encounter for screening for infections with a predominantly sexual mode of transmission: Secondary | ICD-10-CM | POA: Insufficient documentation

## 2023-11-18 DIAGNOSIS — B001 Herpesviral vesicular dermatitis: Secondary | ICD-10-CM

## 2023-11-18 NOTE — Patient Instructions (Addendum)
 Acyclovir or valtrex - look up on GoodRx.com  I can send in one or the other if your labs are positive and you can use doses at the onset of a cold sore

## 2023-11-18 NOTE — Progress Notes (Signed)
 Patient ID: Hannah Gregory, female    DOB: 05/08/1981, 43 y.o.   MRN: [id number]  PCP: Alba Cory, MD  Chief Complaint  Patient presents with   STD Screening    Subjective:   Hannah Gregory is a 43 y.o. female, presents to clinic with CC of the following:  HPI  Pt presents for STD screening Most recently pt was positive for BV, several years ago she was once positive for gonorrhea  Last partner was months ago, he may have had other partners so she request screening prior to any new sexual activity with new partner She notes some intermittent odor with hx of BV, no current increased discharge or irritation She has hx of cold sores and wants HSV testing as well No urinary sx, no pelvic sx   Patient Active Problem List   Diagnosis Date Noted   Admission for sterilization 12/27/2022   Major depression in remission (HCC) 11/07/2022   Pancreatic insufficiency 02/12/2022   Dyslipidemia 02/12/2022   Right arm numbness 12/21/2021   Tobacco use disorder 12/05/2021   Labile essential hypertension 12/05/2021   Headache disorder 07/31/2021   Visual disturbance 07/31/2021   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   B12 deficiency 12/11/2019   Vitamin D deficiency 12/11/2019   Family history of pernicious anemia 12/11/2019   Migraine without aura and without status migrainosus, not intractable 10/14/2019   Menometrorrhagia 06/17/2019   Chronic daily headache 11/29/2016   Neck pain 11/29/2016   Scoliosis of thoracolumbar spine 11/29/2016   Mild depression 11/29/2016   History of anxiety 11/29/2016      Current Outpatient Medications:    amLODipine (NORVASC) 2.5 MG tablet, Take 1 tablet (2.5 mg total) by mouth every evening., Disp: 90 tablet, Rfl: 1   ascorbic acid (VITAMIN C) 500 MG tablet, Take 500 mg by mouth daily., Disp: , Rfl:    atenolol-chlorthalidone (TENORETIC) 100-25 MG tablet, Take 1 tablet by mouth daily., Disp: 90 tablet, Rfl: 1   botulinum toxin Type A (BOTOX) 200  units injection, Inject 200 Units into the muscle. Every 3 months, Disp: , Rfl:    butalbital-acetaminophen-caffeine (FIORICET) 50-325-40 MG tablet, Take by mouth 2 (two) times daily as needed for headache., Disp: , Rfl:    cholecalciferol (VITAMIN D3) 25 MCG (1000 UNIT) tablet, Take 2,000 Units by mouth daily., Disp: , Rfl:    cyanocobalamin (VITAMIN B12) 1000 MCG/ML injection, Inject 1 mL (1,000 mcg total) into the muscle every 30 (thirty) days., Disp: 1 mL, Rfl: 11   fenofibrate (TRICOR) 145 MG tablet, Take 1 tablet (145 mg total) by mouth daily., Disp: 90 tablet, Rfl: 1   Galcanezumab-gnlm (EMGALITY) 120 MG/ML SOAJ, Inject 1 each into the skin every 30 (thirty) days., Disp: , Rfl:    hydrALAZINE (APRESOLINE) 25 MG tablet, Take 1 tablet (25 mg total) by mouth 3 (three) times daily as needed (BP above 150/90)., Disp: 90 tablet, Rfl: 0   hydrOXYzine (ATARAX) 10 MG tablet, TAKE 1 TABLET (10 MG TOTAL) BY MOUTH AT BEDTIME AS NEEDED FOR ANXIETY, Disp: 30 tablet, Rfl: 0   valACYclovir (VALTREX) 1000 MG tablet, Take 0.5 tablets (500 mg total) by mouth daily. Take 2 tablets every 12 hours for 2 doses total, then stop taking.  Can take in the future if symptoms of cold sores just beginning to help blunt the response (Patient taking differently: Take 500 mg by mouth as needed. Take 2 tablets every 12 hours for 2 doses total, then stop taking.  Can  take in the future if symptoms of cold sores just beginning to help blunt the response), Disp: 90 tablet, Rfl: 1   venlafaxine XR (EFFEXOR-XR) 150 MG 24 hr capsule, Take 1 capsule (150 mg total) by mouth daily., Disp: , Rfl:    venlafaxine XR (EFFEXOR-XR) 75 MG 24 hr capsule, Take 1 capsule (75 mg total) by mouth daily with breakfast., Disp: , Rfl:    Allergies  Allergen Reactions   Imitrex [Sumatriptan] Other (See Comments)    Side effect , chest pain, flushed    Chantix [Varenicline Tartrate]     nausea     Social History   Tobacco Use   Smoking status:  Every Day    Current packs/day: 0.50    Average packs/day: 0.5 packs/day for 26.1 years (13.1 ttl pk-yrs)    Types: Cigarettes    Start date: 10/01/1997   Smokeless tobacco: Never  Vaping Use   Vaping status: Never Used  Substance Use Topics   Alcohol use: Yes    Alcohol/week: 3.0 standard drinks of alcohol    Types: 3 Shots of liquor per week    Comment: occasionally   Drug use: No      Chart Review Today: I personally reviewed active problem list, medication list, allergies, family history, social history, health maintenance, notes from last encounter, lab results, imaging with the patient/caregiver today.   Review of Systems  Constitutional: Negative.   HENT: Negative.    Eyes: Negative.   Respiratory: Negative.    Cardiovascular: Negative.   Gastrointestinal: Negative.   Endocrine: Negative.   Genitourinary: Negative.   Musculoskeletal: Negative.   Skin: Negative.   Allergic/Immunologic: Negative.   Neurological: Negative.   Hematological: Negative.   Psychiatric/Behavioral: Negative.    All other systems reviewed and are negative.      Objective:   Vitals:   11/18/23 1548  BP: 128/74  Pulse: 92  Resp: 16  SpO2: 100%  Weight: 173 lb (78.5 kg)  Height: 5\' 6"  (1.676 m)    Body mass index is 27.92 kg/m.  Physical Exam Vitals and nursing note reviewed.  Constitutional:      Appearance: She is well-developed.  HENT:     Head: Normocephalic and atraumatic.     Nose: Nose normal.  Eyes:     General:        Right eye: No discharge.        Left eye: No discharge.     Conjunctiva/sclera: Conjunctivae normal.  Neck:     Trachea: No tracheal deviation.  Cardiovascular:     Rate and Rhythm: Normal rate and regular rhythm.  Pulmonary:     Effort: Pulmonary effort is normal. No respiratory distress.     Breath sounds: No stridor.  Musculoskeletal:        General: Normal range of motion.  Skin:    General: Skin is warm and dry.     Findings: No rash.   Neurological:     Mental Status: She is alert.     Motor: No abnormal muscle tone.     Coordination: Coordination normal.  Psychiatric:        Behavior: Behavior normal.      Results for orders placed or performed in visit on 04/01/23  Cervicovaginal ancillary only   Collection Time: 04/01/23  2:08 PM  Result Value Ref Range   Neisseria Gonorrhea Negative    Chlamydia Negative    Trichomonas Negative    Bacterial Vaginitis (gardnerella) Positive (A)  Candida Vaginitis Positive (A)    Candida Glabrata Negative    Comment      Normal Reference Range Bacterial Vaginosis - Negative   Comment Normal Reference Range Candida Species - Negative    Comment Normal Reference Range Candida Galbrata - Negative    Comment Normal Reference Range Trichomonas - Negative    Comment Normal Reference Ranger Chlamydia - Negative    Comment      Normal Reference Range Neisseria Gonorrhea - Negative  HIV antibody (with reflex)   Collection Time: 04/01/23  2:23 PM  Result Value Ref Range   HIV 1&2 Ab, 4th Generation NON-REACTIVE NON-REACTIVE  RPR   Collection Time: 04/01/23  2:23 PM  Result Value Ref Range   RPR Ser Ql NON-REACTIVE NON-REACTIVE       Assessment & Plan:   1. Screening for STD (sexually transmitted disease) (Primary) Labs ordered here with [facility] and [facility]- pt cash pay - she will compare cost with both labs Possibly some odor with hx of BV, but no vaginal discharge, irritation or urinary sx.  She wants to be screened due to last sexual partner over 6 months ago, no recent sexual activity Cytology per [facility] Discussed different tx options for recurrent BV - encouraged her to look up boric acid and metrogel prices - RPR - HIV Antibody (routine testing w rflx) - Cervicovaginal ancillary only - HSV(herpes simplex vrs) 1+2 ab-IgG - HIV antibody (with reflex) - RPR - HSV 1 and 2 Ab, IgG  2. Recurrent cold sores Cold sores frequently to lips and sometimes in nose, if + labs  can do abortive vs suppressive tx with acyclovir vs valtrex (again per cost pt encouraged to look at goodrx, cost plus drugs [facility] etc)   - HSV(herpes simplex vrs) 1+2 ab-IgG - HSV 1 and 2 Ab, IgG      Danelle Berry, PA-C 11/18/23 4:39 PM

## 2023-11-19 LAB — CERVICOVAGINAL ANCILLARY ONLY
Chlamydia: NEGATIVE
Comment: NEGATIVE
Comment: NEGATIVE
Comment: NORMAL
Neisseria Gonorrhea: NEGATIVE
Trichomonas: NEGATIVE

## 2023-11-20 ENCOUNTER — Encounter: Payer: Self-pay | Admitting: Family Medicine

## 2023-11-22 ENCOUNTER — Ambulatory Visit: Payer: Self-pay | Admitting: Family Medicine

## 2024-01-31 ENCOUNTER — Other Ambulatory Visit: Payer: Self-pay | Admitting: Student

## 2024-01-31 DIAGNOSIS — I639 Cerebral infarction, unspecified: Secondary | ICD-10-CM
